# Patient Record
Sex: Male | Born: 1940 | Race: White | Hispanic: No | Marital: Married | Smoking: Current every day smoker
Health system: Southern US, Community
[De-identification: ages and names within clinical notes are randomized; demographics above are authoritative.]

## PROBLEM LIST (undated history)

## (undated) DIAGNOSIS — I639 Cerebral infarction, unspecified: Secondary | ICD-10-CM

## (undated) DIAGNOSIS — Z950 Presence of cardiac pacemaker: Secondary | ICD-10-CM

## (undated) DIAGNOSIS — E119 Type 2 diabetes mellitus without complications: Secondary | ICD-10-CM

## (undated) DIAGNOSIS — I10 Essential (primary) hypertension: Secondary | ICD-10-CM

## (undated) HISTORY — PX: CARDIAC SURGERY: SHX584

---

## 1997-11-30 ENCOUNTER — Emergency Department (HOSPITAL_COMMUNITY): Admission: EM | Admit: 1997-11-30 | Discharge: 1997-12-01 | Payer: Self-pay | Admitting: Internal Medicine

## 1997-12-03 ENCOUNTER — Inpatient Hospital Stay (HOSPITAL_COMMUNITY): Admission: EM | Admit: 1997-12-03 | Discharge: 1997-12-07 | Payer: Self-pay | Admitting: *Deleted

## 1998-06-29 ENCOUNTER — Encounter: Payer: Self-pay | Admitting: Cardiovascular Disease

## 1998-06-29 ENCOUNTER — Ambulatory Visit (HOSPITAL_COMMUNITY): Admission: RE | Admit: 1998-06-29 | Discharge: 1998-06-29 | Payer: Self-pay | Admitting: Cardiovascular Disease

## 1999-01-17 ENCOUNTER — Inpatient Hospital Stay (HOSPITAL_COMMUNITY): Admission: EM | Admit: 1999-01-17 | Discharge: 1999-01-20 | Payer: Self-pay | Admitting: Emergency Medicine

## 1999-01-19 ENCOUNTER — Encounter: Payer: Self-pay | Admitting: Cardiovascular Disease

## 2000-05-26 ENCOUNTER — Inpatient Hospital Stay (HOSPITAL_COMMUNITY): Admission: EM | Admit: 2000-05-26 | Discharge: 2000-05-30 | Payer: Self-pay

## 2002-07-03 ENCOUNTER — Inpatient Hospital Stay (HOSPITAL_COMMUNITY): Admission: EM | Admit: 2002-07-03 | Discharge: 2002-07-08 | Payer: Self-pay | Admitting: Emergency Medicine

## 2002-07-03 ENCOUNTER — Encounter: Payer: Self-pay | Admitting: Emergency Medicine

## 2002-07-04 ENCOUNTER — Encounter: Payer: Self-pay | Admitting: Neurology

## 2002-10-29 ENCOUNTER — Inpatient Hospital Stay (HOSPITAL_COMMUNITY): Admission: RE | Admit: 2002-10-29 | Discharge: 2002-11-01 | Payer: Self-pay | Admitting: Cardiovascular Disease

## 2011-05-11 DIAGNOSIS — M722 Plantar fascial fibromatosis: Secondary | ICD-10-CM | POA: Diagnosis not present

## 2011-08-03 DIAGNOSIS — Z79899 Other long term (current) drug therapy: Secondary | ICD-10-CM | POA: Diagnosis not present

## 2011-08-03 DIAGNOSIS — Z941 Heart transplant status: Secondary | ICD-10-CM | POA: Diagnosis not present

## 2011-08-03 DIAGNOSIS — Z48298 Encounter for aftercare following other organ transplant: Secondary | ICD-10-CM | POA: Diagnosis not present

## 2011-08-03 DIAGNOSIS — E119 Type 2 diabetes mellitus without complications: Secondary | ICD-10-CM | POA: Diagnosis not present

## 2012-01-10 DIAGNOSIS — Z23 Encounter for immunization: Secondary | ICD-10-CM | POA: Diagnosis not present

## 2012-01-30 DIAGNOSIS — Z48298 Encounter for aftercare following other organ transplant: Secondary | ICD-10-CM | POA: Diagnosis not present

## 2012-01-30 DIAGNOSIS — I517 Cardiomegaly: Secondary | ICD-10-CM | POA: Diagnosis not present

## 2012-01-30 DIAGNOSIS — Z125 Encounter for screening for malignant neoplasm of prostate: Secondary | ICD-10-CM | POA: Diagnosis not present

## 2012-01-30 DIAGNOSIS — Z01810 Encounter for preprocedural cardiovascular examination: Secondary | ICD-10-CM | POA: Diagnosis not present

## 2012-01-30 DIAGNOSIS — Z79899 Other long term (current) drug therapy: Secondary | ICD-10-CM | POA: Diagnosis not present

## 2012-01-30 DIAGNOSIS — Z7982 Long term (current) use of aspirin: Secondary | ICD-10-CM | POA: Diagnosis not present

## 2012-01-30 DIAGNOSIS — I5022 Chronic systolic (congestive) heart failure: Secondary | ICD-10-CM | POA: Diagnosis not present

## 2012-01-30 DIAGNOSIS — Z7901 Long term (current) use of anticoagulants: Secondary | ICD-10-CM | POA: Diagnosis not present

## 2012-01-30 DIAGNOSIS — E119 Type 2 diabetes mellitus without complications: Secondary | ICD-10-CM | POA: Diagnosis not present

## 2012-01-30 DIAGNOSIS — Z794 Long term (current) use of insulin: Secondary | ICD-10-CM | POA: Diagnosis not present

## 2012-01-30 DIAGNOSIS — Z941 Heart transplant status: Secondary | ICD-10-CM | POA: Diagnosis not present

## 2012-03-18 ENCOUNTER — Encounter (HOSPITAL_COMMUNITY): Payer: Self-pay | Admitting: *Deleted

## 2012-03-18 ENCOUNTER — Emergency Department (HOSPITAL_COMMUNITY)
Admission: EM | Admit: 2012-03-18 | Discharge: 2012-03-19 | Disposition: A | Discharge status: Home / Self Care | Payer: Medicare Other | Attending: Emergency Medicine | Admitting: Emergency Medicine

## 2012-03-18 ENCOUNTER — Emergency Department (HOSPITAL_COMMUNITY): Payer: Medicare Other

## 2012-03-18 DIAGNOSIS — E119 Type 2 diabetes mellitus without complications: Secondary | ICD-10-CM | POA: Insufficient documentation

## 2012-03-18 DIAGNOSIS — R51 Headache: Secondary | ICD-10-CM | POA: Diagnosis not present

## 2012-03-18 DIAGNOSIS — Z8673 Personal history of transient ischemic attack (TIA), and cerebral infarction without residual deficits: Secondary | ICD-10-CM | POA: Insufficient documentation

## 2012-03-18 DIAGNOSIS — J40 Bronchitis, not specified as acute or chronic: Secondary | ICD-10-CM | POA: Diagnosis not present

## 2012-03-18 DIAGNOSIS — J3489 Other specified disorders of nose and nasal sinuses: Secondary | ICD-10-CM | POA: Diagnosis not present

## 2012-03-18 DIAGNOSIS — E871 Hypo-osmolality and hyponatremia: Secondary | ICD-10-CM | POA: Diagnosis not present

## 2012-03-18 DIAGNOSIS — J209 Acute bronchitis, unspecified: Secondary | ICD-10-CM | POA: Diagnosis not present

## 2012-03-18 DIAGNOSIS — J9819 Other pulmonary collapse: Secondary | ICD-10-CM | POA: Diagnosis not present

## 2012-03-18 HISTORY — DX: Type 2 diabetes mellitus without complications: E11.9

## 2012-03-18 HISTORY — DX: Cerebral infarction, unspecified: I63.9

## 2012-03-18 LAB — CBC WITH DIFFERENTIAL/PLATELET
Basophils Absolute: 0 10*3/uL (ref 0.0–0.1)
Basophils Relative: 0 % (ref 0–1)
Eosinophils Absolute: 0.3 10*3/uL (ref 0.0–0.7)
Eosinophils Relative: 3 % (ref 0–5)
HCT: 35.7 % — ABNORMAL LOW (ref 39.0–52.0)
MCH: 31.3 pg (ref 26.0–34.0)
MCHC: 35 g/dL (ref 30.0–36.0)
MCV: 89.3 fL (ref 78.0–100.0)
Monocytes Absolute: 0.8 10*3/uL (ref 0.1–1.0)
Platelets: 149 10*3/uL — ABNORMAL LOW (ref 150–400)
RDW: 12.1 % (ref 11.5–15.5)
WBC: 8.5 10*3/uL (ref 4.0–10.5)

## 2012-03-18 LAB — COMPREHENSIVE METABOLIC PANEL
ALT: 13 U/L (ref 0–53)
Albumin: 3.7 g/dL (ref 3.5–5.2)
Alkaline Phosphatase: 54 U/L (ref 39–117)
BUN: 25 mg/dL — ABNORMAL HIGH (ref 6–23)
Chloride: 90 mEq/L — ABNORMAL LOW (ref 96–112)
Potassium: 4.3 mEq/L (ref 3.5–5.1)
Sodium: 125 mEq/L — ABNORMAL LOW (ref 135–145)
Total Bilirubin: 0.6 mg/dL (ref 0.3–1.2)
Total Protein: 6.7 g/dL (ref 6.0–8.3)

## 2012-03-18 NOTE — ED Notes (Addendum)
Pt of [facility].  C/o cough and congestion, cough persistant and intermitantly productive. Onset 10d ago. Sob onset today. Unable to lie flat. (Has tried: robitussin, lozenges, vick's vapor rub, humidifier, alka seltzer cold plus, delsym). heart transplant pt (2006). Recent trip to [place] (flight). Denies CP. (denies: nvd fever), Feels like he pulled a muscle in RUQ area. Wearing a mask.

## 2012-03-18 NOTE — ED Provider Notes (Signed)
History     CSN: [id number]  Arrival date & time 03/18/12  2213   First MD Initiated Contact with Patient 03/18/12 2335      Chief Complaint  Patient presents with  . Cough  . Nasal Congestion  . Headache    (Consider location/radiation/quality/duration/timing/severity/associated sxs/prior treatment) Patient is a 71 y.o. male presenting with cough and headaches. The history is provided by the patient.  Cough This is a new problem. The current episode started more than 1 week ago. The problem occurs every few minutes. The problem has not changed since onset.The cough is productive of purulent sputum. There has been no fever. Associated symptoms include headaches. Pertinent negatives include no chest pain, no chills, no sweats, no weight loss, no sore throat, no myalgias, no shortness of breath, no wheezing and no eye redness. He has tried nothing for the symptoms. The treatment provided no relief. He is not a smoker. His past medical history does not include pneumonia.  Headache  This is a chronic problem. The current episode started more than 1 week ago (at least 4 weeks ago). The problem occurs constantly. The problem has not changed since onset.The headache is associated with nothing. Pain location: moves around. The pain is at a severity of 4/10. The pain is moderate. The pain does not radiate. Pertinent negatives include no anorexia, no fever, no malaise/fatigue, no chest pressure, no near-syncope, no orthopnea, no palpitations, no syncope, no shortness of breath, no nausea and no vomiting. He has tried nothing for the symptoms. The treatment provided no relief.    Past Medical History  Diagnosis Date  . Diabetes mellitus without complication   . Stroke     Past Surgical History  Procedure Date  . Cardiac surgery     No family history on file.  History  Substance Use Topics  . Smoking status: Never Smoker   . Smokeless tobacco: Not on file  . Alcohol Use: No       Review of Systems  Constitutional: Negative for fever, chills, weight loss and malaise/fatigue.  HENT: Negative for sore throat, neck pain and neck stiffness.   Eyes: Negative for redness.  Respiratory: Positive for cough. Negative for choking, chest tightness, shortness of breath, wheezing and stridor.   Cardiovascular: Negative for chest pain, palpitations, orthopnea, leg swelling, syncope and near-syncope.  Gastrointestinal: Negative for nausea, vomiting, abdominal pain, diarrhea and anorexia.  Musculoskeletal: Negative for myalgias.  Neurological: Positive for headaches. Negative for dizziness, seizures, syncope, facial asymmetry, weakness, light-headedness and numbness.  Hematological: Negative for adenopathy.  All other systems reviewed and are negative.    Allergies  Codeine  Home Medications  No current outpatient prescriptions on file.  BP 154/87  Pulse 98  Temp 98.1 F (36.7 C) (Oral)  Resp 20  SpO2 97%  Physical Exam  Constitutional: He is oriented to person, place, and time. He appears well-developed and well-nourished. No distress.  HENT:  Head: Normocephalic and atraumatic.  Mouth/Throat: Oropharynx is clear and moist.  Eyes: Conjunctivae normal and EOM are normal. Pupils are equal, round, and reactive to light.  Neck: Normal range of motion. Neck supple.  Cardiovascular: Normal rate and regular rhythm.   Pulmonary/Chest: Effort normal and breath sounds normal. No stridor. No respiratory distress. He has no wheezes. He has no rales.  Abdominal: Soft. Bowel sounds are normal. There is no tenderness. There is no rebound and no guarding.  Musculoskeletal: Normal range of motion.  Lymphadenopathy:    He has no  cervical adenopathy.  Neurological: He is alert and oriented to person, place, and time. He has normal reflexes. No cranial nerve deficit.  Skin: Skin is warm and dry.  Psychiatric: He has a normal mood and affect.    ED Course  Procedures  (including critical care time)  Labs Reviewed  COMPREHENSIVE METABOLIC PANEL - Abnormal; Notable for the following:    Sodium 125 (*)     Chloride 90 (*)     BUN 25 (*)     GFR calc non Af Amer 65 (*)     GFR calc Af Amer 76 (*)     All other components within normal limits  CBC WITH DIFFERENTIAL - Abnormal; Notable for the following:    RBC 4.00 (*)     Hemoglobin 12.5 (*)     HCT 35.7 (*)     Platelets 149 (*)     Neutrophils Relative 78 (*)     Lymphocytes Relative 9 (*)     All other components within normal limits   Dg Chest 2 View  03/18/2012  *RADIOLOGY REPORT*  Clinical Data: Abdominal pain, vomiting  CHEST - 2 VIEW  Comparison: None  Findings: Upper normal heart size post median sternotomy. Calcified tortuous aorta. Elevation left diaphragm. Mediastinal contours and pulmonary vascularity normal. Left basilar atelectasis. Calcified granuloma mid right lung. Remaining lungs clear. No pleural effusion or pneumothorax. Bones unremarkable.  IMPRESSION: Elevation of left diaphragm with left basilar atelectasis. Prior median sternotomy. No acute abnormalities.   Original Report Authenticated By: Ulyses Southward, M.D.      No diagnosis found.    MDM  Given patient's history will treat with antibiotics.  Patient must have sodium rechecked this week.  Patient and wife verbalize understanding.  Return for chest pain shortness of breath, fevers > 101, weakness numbness changes in cognition or any concerns.  Follow up in 2 days with your family doctor. Patient and wife verbalize understanding and agree to follow up         Christiann Hagerty Smitty Cords, MD 03/19/12 7829

## 2012-03-19 ENCOUNTER — Encounter (HOSPITAL_COMMUNITY): Payer: Self-pay | Admitting: Emergency Medicine

## 2012-03-19 ENCOUNTER — Emergency Department (HOSPITAL_COMMUNITY): Payer: Medicare Other

## 2012-03-19 DIAGNOSIS — R51 Headache: Secondary | ICD-10-CM | POA: Diagnosis not present

## 2012-03-19 MED ORDER — LEVOFLOXACIN 750 MG PO TABS
750.0000 mg | ORAL_TABLET | Freq: Once | ORAL | Status: AC
  Administered 2012-03-19: 750 mg via ORAL
  Filled 2012-03-19: qty 1

## 2012-03-19 MED ORDER — TRAMADOL HCL 50 MG PO TABS
50.0000 mg | ORAL_TABLET | Freq: Once | ORAL | Status: AC
  Administered 2012-03-19: 50 mg via ORAL
  Filled 2012-03-19: qty 1

## 2012-03-19 MED ORDER — AMOXICILLIN-POT CLAVULANATE 875-125 MG PO TABS: 1.0000 | ORAL_TABLET | Freq: Two times a day (BID) | ORAL | Status: DC

## 2012-03-19 MED ORDER — BENZONATATE 100 MG PO CAPS: 100.0000 mg | ORAL_CAPSULE | Freq: Three times a day (TID) | ORAL | Status: DC

## 2012-03-19 NOTE — ED Notes (Signed)
Pt began having a productive cough 11 days ago, pt has been taking over the counter cold medications without relief.  Cough is no longer productive but is still strong and present.  Pt diaphoretic upon interview, placed on heart monitor.  Denies any N/V.  Pt is a heart transplant patient.

## 2012-03-21 DIAGNOSIS — E871 Hypo-osmolality and hyponatremia: Secondary | ICD-10-CM | POA: Diagnosis not present

## 2012-03-21 DIAGNOSIS — R05 Cough: Secondary | ICD-10-CM | POA: Diagnosis not present

## 2012-03-21 DIAGNOSIS — I10 Essential (primary) hypertension: Secondary | ICD-10-CM | POA: Diagnosis not present

## 2012-03-21 DIAGNOSIS — Z1331 Encounter for screening for depression: Secondary | ICD-10-CM | POA: Diagnosis not present

## 2012-03-21 DIAGNOSIS — J310 Chronic rhinitis: Secondary | ICD-10-CM | POA: Diagnosis not present

## 2012-05-23 DIAGNOSIS — E119 Type 2 diabetes mellitus without complications: Secondary | ICD-10-CM | POA: Diagnosis not present

## 2012-05-23 DIAGNOSIS — Z125 Encounter for screening for malignant neoplasm of prostate: Secondary | ICD-10-CM | POA: Diagnosis not present

## 2012-05-23 DIAGNOSIS — I10 Essential (primary) hypertension: Secondary | ICD-10-CM | POA: Diagnosis not present

## 2012-05-23 DIAGNOSIS — E782 Mixed hyperlipidemia: Secondary | ICD-10-CM | POA: Diagnosis not present

## 2012-05-28 DIAGNOSIS — Z1212 Encounter for screening for malignant neoplasm of rectum: Secondary | ICD-10-CM | POA: Diagnosis not present

## 2012-05-30 DIAGNOSIS — Z0000 Encounter for general adult medical examination without abnormal findings: Secondary | ICD-10-CM | POA: Diagnosis not present

## 2012-05-30 DIAGNOSIS — I10 Essential (primary) hypertension: Secondary | ICD-10-CM | POA: Diagnosis not present

## 2012-05-30 DIAGNOSIS — E119 Type 2 diabetes mellitus without complications: Secondary | ICD-10-CM | POA: Diagnosis not present

## 2012-05-30 DIAGNOSIS — Z125 Encounter for screening for malignant neoplasm of prostate: Secondary | ICD-10-CM | POA: Diagnosis not present

## 2012-08-15 DIAGNOSIS — Z79899 Other long term (current) drug therapy: Secondary | ICD-10-CM | POA: Diagnosis not present

## 2012-08-15 DIAGNOSIS — B469 Zygomycosis, unspecified: Secondary | ICD-10-CM | POA: Diagnosis not present

## 2012-08-15 DIAGNOSIS — Z48298 Encounter for aftercare following other organ transplant: Secondary | ICD-10-CM | POA: Diagnosis not present

## 2012-08-15 DIAGNOSIS — I517 Cardiomegaly: Secondary | ICD-10-CM | POA: Diagnosis not present

## 2012-08-15 DIAGNOSIS — Z941 Heart transplant status: Secondary | ICD-10-CM | POA: Diagnosis not present

## 2012-10-05 DIAGNOSIS — D485 Neoplasm of uncertain behavior of skin: Secondary | ICD-10-CM | POA: Diagnosis not present

## 2012-10-05 DIAGNOSIS — D0439 Carcinoma in situ of skin of other parts of face: Secondary | ICD-10-CM | POA: Diagnosis not present

## 2012-10-05 DIAGNOSIS — L570 Actinic keratosis: Secondary | ICD-10-CM | POA: Diagnosis not present

## 2012-10-05 DIAGNOSIS — Z85828 Personal history of other malignant neoplasm of skin: Secondary | ICD-10-CM | POA: Diagnosis not present

## 2012-10-05 DIAGNOSIS — D045 Carcinoma in situ of skin of trunk: Secondary | ICD-10-CM | POA: Diagnosis not present

## 2012-11-01 DIAGNOSIS — Z85828 Personal history of other malignant neoplasm of skin: Secondary | ICD-10-CM | POA: Diagnosis not present

## 2012-11-01 DIAGNOSIS — D0439 Carcinoma in situ of skin of other parts of face: Secondary | ICD-10-CM | POA: Diagnosis not present

## 2012-11-30 DIAGNOSIS — R197 Diarrhea, unspecified: Secondary | ICD-10-CM | POA: Diagnosis not present

## 2012-11-30 DIAGNOSIS — E119 Type 2 diabetes mellitus without complications: Secondary | ICD-10-CM | POA: Diagnosis not present

## 2012-11-30 DIAGNOSIS — M545 Low back pain: Secondary | ICD-10-CM | POA: Diagnosis not present

## 2012-11-30 DIAGNOSIS — M5137 Other intervertebral disc degeneration, lumbosacral region: Secondary | ICD-10-CM | POA: Diagnosis not present

## 2012-11-30 DIAGNOSIS — M47817 Spondylosis without myelopathy or radiculopathy, lumbosacral region: Secondary | ICD-10-CM | POA: Diagnosis not present

## 2012-11-30 DIAGNOSIS — G4700 Insomnia, unspecified: Secondary | ICD-10-CM | POA: Diagnosis not present

## 2012-12-04 DIAGNOSIS — R197 Diarrhea, unspecified: Secondary | ICD-10-CM | POA: Diagnosis not present

## 2013-01-03 DIAGNOSIS — Z23 Encounter for immunization: Secondary | ICD-10-CM | POA: Diagnosis not present

## 2013-02-13 DIAGNOSIS — Z79899 Other long term (current) drug therapy: Secondary | ICD-10-CM | POA: Diagnosis not present

## 2013-02-13 DIAGNOSIS — I517 Cardiomegaly: Secondary | ICD-10-CM | POA: Diagnosis not present

## 2013-02-13 DIAGNOSIS — Z125 Encounter for screening for malignant neoplasm of prostate: Secondary | ICD-10-CM | POA: Diagnosis not present

## 2013-02-13 DIAGNOSIS — Z48298 Encounter for aftercare following other organ transplant: Secondary | ICD-10-CM | POA: Diagnosis not present

## 2013-02-13 DIAGNOSIS — Z01810 Encounter for preprocedural cardiovascular examination: Secondary | ICD-10-CM | POA: Diagnosis not present

## 2013-02-13 DIAGNOSIS — Z941 Heart transplant status: Secondary | ICD-10-CM | POA: Diagnosis not present

## 2013-02-13 DIAGNOSIS — Z5181 Encounter for therapeutic drug level monitoring: Secondary | ICD-10-CM | POA: Diagnosis not present

## 2013-03-01 DIAGNOSIS — D0439 Carcinoma in situ of skin of other parts of face: Secondary | ICD-10-CM | POA: Diagnosis not present

## 2013-03-01 DIAGNOSIS — D692 Other nonthrombocytopenic purpura: Secondary | ICD-10-CM | POA: Diagnosis not present

## 2013-03-01 DIAGNOSIS — L570 Actinic keratosis: Secondary | ICD-10-CM | POA: Diagnosis not present

## 2013-03-01 DIAGNOSIS — C44319 Basal cell carcinoma of skin of other parts of face: Secondary | ICD-10-CM | POA: Diagnosis not present

## 2013-03-01 DIAGNOSIS — Z85828 Personal history of other malignant neoplasm of skin: Secondary | ICD-10-CM | POA: Diagnosis not present

## 2013-03-01 DIAGNOSIS — L910 Hypertrophic scar: Secondary | ICD-10-CM | POA: Diagnosis not present

## 2013-03-01 DIAGNOSIS — D0460 Carcinoma in situ of skin of unspecified upper limb, including shoulder: Secondary | ICD-10-CM | POA: Diagnosis not present

## 2013-03-01 DIAGNOSIS — B079 Viral wart, unspecified: Secondary | ICD-10-CM | POA: Diagnosis not present

## 2013-03-01 DIAGNOSIS — L821 Other seborrheic keratosis: Secondary | ICD-10-CM | POA: Diagnosis not present

## 2013-03-06 DIAGNOSIS — I10 Essential (primary) hypertension: Secondary | ICD-10-CM | POA: Diagnosis not present

## 2013-03-06 DIAGNOSIS — E782 Mixed hyperlipidemia: Secondary | ICD-10-CM | POA: Diagnosis not present

## 2013-03-06 DIAGNOSIS — E1159 Type 2 diabetes mellitus with other circulatory complications: Secondary | ICD-10-CM | POA: Diagnosis not present

## 2013-03-06 DIAGNOSIS — Z941 Heart transplant status: Secondary | ICD-10-CM | POA: Diagnosis not present

## 2013-03-06 DIAGNOSIS — I739 Peripheral vascular disease, unspecified: Secondary | ICD-10-CM | POA: Diagnosis not present

## 2013-03-06 DIAGNOSIS — Z6829 Body mass index (BMI) 29.0-29.9, adult: Secondary | ICD-10-CM | POA: Diagnosis not present

## 2013-04-09 DIAGNOSIS — Z85828 Personal history of other malignant neoplasm of skin: Secondary | ICD-10-CM | POA: Diagnosis not present

## 2013-04-09 DIAGNOSIS — C44320 Squamous cell carcinoma of skin of unspecified parts of face: Secondary | ICD-10-CM | POA: Diagnosis not present

## 2013-04-16 DIAGNOSIS — Z85828 Personal history of other malignant neoplasm of skin: Secondary | ICD-10-CM | POA: Diagnosis not present

## 2013-04-16 DIAGNOSIS — C44319 Basal cell carcinoma of skin of other parts of face: Secondary | ICD-10-CM | POA: Diagnosis not present

## 2013-05-27 DIAGNOSIS — Z125 Encounter for screening for malignant neoplasm of prostate: Secondary | ICD-10-CM | POA: Diagnosis not present

## 2013-05-27 DIAGNOSIS — E1159 Type 2 diabetes mellitus with other circulatory complications: Secondary | ICD-10-CM | POA: Diagnosis not present

## 2013-05-27 DIAGNOSIS — I10 Essential (primary) hypertension: Secondary | ICD-10-CM | POA: Diagnosis not present

## 2013-05-31 DIAGNOSIS — Z1212 Encounter for screening for malignant neoplasm of rectum: Secondary | ICD-10-CM | POA: Diagnosis not present

## 2013-06-03 DIAGNOSIS — I739 Peripheral vascular disease, unspecified: Secondary | ICD-10-CM | POA: Diagnosis not present

## 2013-06-03 DIAGNOSIS — Z125 Encounter for screening for malignant neoplasm of prostate: Secondary | ICD-10-CM | POA: Diagnosis not present

## 2013-06-03 DIAGNOSIS — N183 Chronic kidney disease, stage 3 unspecified: Secondary | ICD-10-CM | POA: Diagnosis not present

## 2013-06-03 DIAGNOSIS — E1159 Type 2 diabetes mellitus with other circulatory complications: Secondary | ICD-10-CM | POA: Diagnosis not present

## 2013-06-03 DIAGNOSIS — N529 Male erectile dysfunction, unspecified: Secondary | ICD-10-CM | POA: Diagnosis not present

## 2013-06-03 DIAGNOSIS — Z0000 Encounter for general adult medical examination without abnormal findings: Secondary | ICD-10-CM | POA: Diagnosis not present

## 2013-06-03 DIAGNOSIS — Z1331 Encounter for screening for depression: Secondary | ICD-10-CM | POA: Diagnosis not present

## 2013-06-03 DIAGNOSIS — Z941 Heart transplant status: Secondary | ICD-10-CM | POA: Diagnosis not present

## 2013-06-03 DIAGNOSIS — G2581 Restless legs syndrome: Secondary | ICD-10-CM | POA: Diagnosis not present

## 2013-06-03 DIAGNOSIS — E782 Mixed hyperlipidemia: Secondary | ICD-10-CM | POA: Diagnosis not present

## 2013-08-27 DIAGNOSIS — B469 Zygomycosis, unspecified: Secondary | ICD-10-CM | POA: Diagnosis not present

## 2013-08-27 DIAGNOSIS — I10 Essential (primary) hypertension: Secondary | ICD-10-CM | POA: Diagnosis not present

## 2013-08-27 DIAGNOSIS — Z79899 Other long term (current) drug therapy: Secondary | ICD-10-CM | POA: Diagnosis not present

## 2013-08-27 DIAGNOSIS — I517 Cardiomegaly: Secondary | ICD-10-CM | POA: Diagnosis not present

## 2013-08-27 DIAGNOSIS — Z48298 Encounter for aftercare following other organ transplant: Secondary | ICD-10-CM | POA: Diagnosis not present

## 2013-08-27 DIAGNOSIS — Z941 Heart transplant status: Secondary | ICD-10-CM | POA: Diagnosis not present

## 2013-09-12 DIAGNOSIS — E1159 Type 2 diabetes mellitus with other circulatory complications: Secondary | ICD-10-CM | POA: Diagnosis not present

## 2013-09-12 DIAGNOSIS — Z6829 Body mass index (BMI) 29.0-29.9, adult: Secondary | ICD-10-CM | POA: Diagnosis not present

## 2013-09-12 DIAGNOSIS — I739 Peripheral vascular disease, unspecified: Secondary | ICD-10-CM | POA: Diagnosis not present

## 2013-09-12 DIAGNOSIS — E782 Mixed hyperlipidemia: Secondary | ICD-10-CM | POA: Diagnosis not present

## 2013-09-12 DIAGNOSIS — N183 Chronic kidney disease, stage 3 unspecified: Secondary | ICD-10-CM | POA: Diagnosis not present

## 2013-09-12 DIAGNOSIS — I10 Essential (primary) hypertension: Secondary | ICD-10-CM | POA: Diagnosis not present

## 2013-09-12 DIAGNOSIS — Z941 Heart transplant status: Secondary | ICD-10-CM | POA: Diagnosis not present

## 2013-10-03 DIAGNOSIS — D239 Other benign neoplasm of skin, unspecified: Secondary | ICD-10-CM | POA: Diagnosis not present

## 2013-10-03 DIAGNOSIS — L570 Actinic keratosis: Secondary | ICD-10-CM | POA: Diagnosis not present

## 2013-10-03 DIAGNOSIS — L821 Other seborrheic keratosis: Secondary | ICD-10-CM | POA: Diagnosis not present

## 2013-10-03 DIAGNOSIS — Z85828 Personal history of other malignant neoplasm of skin: Secondary | ICD-10-CM | POA: Diagnosis not present

## 2013-10-03 DIAGNOSIS — D1801 Hemangioma of skin and subcutaneous tissue: Secondary | ICD-10-CM | POA: Diagnosis not present

## 2013-10-03 DIAGNOSIS — L259 Unspecified contact dermatitis, unspecified cause: Secondary | ICD-10-CM | POA: Diagnosis not present

## 2013-12-03 DIAGNOSIS — H524 Presbyopia: Secondary | ICD-10-CM | POA: Diagnosis not present

## 2013-12-03 DIAGNOSIS — H4011X0 Primary open-angle glaucoma, stage unspecified: Secondary | ICD-10-CM | POA: Diagnosis not present

## 2013-12-03 DIAGNOSIS — H2510 Age-related nuclear cataract, unspecified eye: Secondary | ICD-10-CM | POA: Diagnosis not present

## 2013-12-03 DIAGNOSIS — H409 Unspecified glaucoma: Secondary | ICD-10-CM | POA: Diagnosis not present

## 2013-12-12 DIAGNOSIS — I739 Peripheral vascular disease, unspecified: Secondary | ICD-10-CM | POA: Diagnosis not present

## 2013-12-12 DIAGNOSIS — R55 Syncope and collapse: Secondary | ICD-10-CM | POA: Diagnosis not present

## 2013-12-12 DIAGNOSIS — E1159 Type 2 diabetes mellitus with other circulatory complications: Secondary | ICD-10-CM | POA: Diagnosis not present

## 2013-12-12 DIAGNOSIS — Z6830 Body mass index (BMI) 30.0-30.9, adult: Secondary | ICD-10-CM | POA: Diagnosis not present

## 2013-12-16 DIAGNOSIS — Z941 Heart transplant status: Secondary | ICD-10-CM | POA: Diagnosis not present

## 2013-12-16 DIAGNOSIS — Z79899 Other long term (current) drug therapy: Secondary | ICD-10-CM | POA: Diagnosis not present

## 2013-12-19 DIAGNOSIS — Z941 Heart transplant status: Secondary | ICD-10-CM | POA: Diagnosis not present

## 2014-01-29 DIAGNOSIS — R55 Syncope and collapse: Secondary | ICD-10-CM | POA: Diagnosis not present

## 2014-01-29 DIAGNOSIS — Z23 Encounter for immunization: Secondary | ICD-10-CM | POA: Diagnosis not present

## 2014-01-29 DIAGNOSIS — Z941 Heart transplant status: Secondary | ICD-10-CM | POA: Diagnosis not present

## 2014-01-30 DIAGNOSIS — R55 Syncope and collapse: Secondary | ICD-10-CM | POA: Diagnosis not present

## 2014-02-04 DIAGNOSIS — H4011X2 Primary open-angle glaucoma, moderate stage: Secondary | ICD-10-CM | POA: Diagnosis not present

## 2014-02-22 DIAGNOSIS — I442 Atrioventricular block, complete: Secondary | ICD-10-CM | POA: Diagnosis not present

## 2014-02-22 DIAGNOSIS — E785 Hyperlipidemia, unspecified: Secondary | ICD-10-CM | POA: Diagnosis present

## 2014-02-22 DIAGNOSIS — I2510 Atherosclerotic heart disease of native coronary artery without angina pectoris: Secondary | ICD-10-CM | POA: Diagnosis not present

## 2014-02-22 DIAGNOSIS — I10 Essential (primary) hypertension: Secondary | ICD-10-CM | POA: Diagnosis present

## 2014-02-22 DIAGNOSIS — Z941 Heart transplant status: Secondary | ICD-10-CM | POA: Diagnosis not present

## 2014-02-22 DIAGNOSIS — Z45018 Encounter for adjustment and management of other part of cardiac pacemaker: Secondary | ICD-10-CM | POA: Diagnosis not present

## 2014-02-22 DIAGNOSIS — Z48298 Encounter for aftercare following other organ transplant: Secondary | ICD-10-CM | POA: Diagnosis not present

## 2014-02-22 DIAGNOSIS — Z01810 Encounter for preprocedural cardiovascular examination: Secondary | ICD-10-CM | POA: Diagnosis not present

## 2014-02-22 DIAGNOSIS — R55 Syncope and collapse: Secondary | ICD-10-CM | POA: Diagnosis not present

## 2014-02-22 DIAGNOSIS — Z8673 Personal history of transient ischemic attack (TIA), and cerebral infarction without residual deficits: Secondary | ICD-10-CM | POA: Diagnosis not present

## 2014-02-22 DIAGNOSIS — I498 Other specified cardiac arrhythmias: Secondary | ICD-10-CM | POA: Diagnosis not present

## 2014-02-22 DIAGNOSIS — I495 Sick sinus syndrome: Secondary | ICD-10-CM | POA: Diagnosis present

## 2014-02-22 DIAGNOSIS — J9811 Atelectasis: Secondary | ICD-10-CM | POA: Diagnosis not present

## 2014-02-22 DIAGNOSIS — Z79899 Other long term (current) drug therapy: Secondary | ICD-10-CM | POA: Diagnosis not present

## 2014-02-22 DIAGNOSIS — T86298 Other complications of heart transplant: Secondary | ICD-10-CM | POA: Diagnosis not present

## 2014-02-22 DIAGNOSIS — E119 Type 2 diabetes mellitus without complications: Secondary | ICD-10-CM | POA: Diagnosis present

## 2014-03-27 DIAGNOSIS — E1151 Type 2 diabetes mellitus with diabetic peripheral angiopathy without gangrene: Secondary | ICD-10-CM | POA: Diagnosis not present

## 2014-03-27 DIAGNOSIS — R55 Syncope and collapse: Secondary | ICD-10-CM | POA: Diagnosis not present

## 2014-03-27 DIAGNOSIS — E782 Mixed hyperlipidemia: Secondary | ICD-10-CM | POA: Diagnosis not present

## 2014-03-27 DIAGNOSIS — I739 Peripheral vascular disease, unspecified: Secondary | ICD-10-CM | POA: Diagnosis not present

## 2014-03-27 DIAGNOSIS — I10 Essential (primary) hypertension: Secondary | ICD-10-CM | POA: Diagnosis not present

## 2014-03-27 DIAGNOSIS — G2581 Restless legs syndrome: Secondary | ICD-10-CM | POA: Diagnosis not present

## 2014-03-27 DIAGNOSIS — Z941 Heart transplant status: Secondary | ICD-10-CM | POA: Diagnosis not present

## 2014-03-31 DIAGNOSIS — Z23 Encounter for immunization: Secondary | ICD-10-CM | POA: Diagnosis not present

## 2014-03-31 DIAGNOSIS — Z941 Heart transplant status: Secondary | ICD-10-CM | POA: Diagnosis not present

## 2014-03-31 DIAGNOSIS — R55 Syncope and collapse: Secondary | ICD-10-CM | POA: Diagnosis not present

## 2014-03-31 DIAGNOSIS — Z4821 Encounter for aftercare following heart transplant: Secondary | ICD-10-CM | POA: Diagnosis not present

## 2014-03-31 DIAGNOSIS — Z79899 Other long term (current) drug therapy: Secondary | ICD-10-CM | POA: Diagnosis not present

## 2014-03-31 DIAGNOSIS — E119 Type 2 diabetes mellitus without complications: Secondary | ICD-10-CM | POA: Diagnosis not present

## 2014-04-04 DIAGNOSIS — L570 Actinic keratosis: Secondary | ICD-10-CM | POA: Diagnosis not present

## 2014-04-04 DIAGNOSIS — D045 Carcinoma in situ of skin of trunk: Secondary | ICD-10-CM | POA: Diagnosis not present

## 2014-04-04 DIAGNOSIS — D485 Neoplasm of uncertain behavior of skin: Secondary | ICD-10-CM | POA: Diagnosis not present

## 2014-04-04 DIAGNOSIS — Z85828 Personal history of other malignant neoplasm of skin: Secondary | ICD-10-CM | POA: Diagnosis not present

## 2014-04-04 DIAGNOSIS — D225 Melanocytic nevi of trunk: Secondary | ICD-10-CM | POA: Diagnosis not present

## 2014-04-04 DIAGNOSIS — D692 Other nonthrombocytopenic purpura: Secondary | ICD-10-CM | POA: Diagnosis not present

## 2014-04-04 DIAGNOSIS — L821 Other seborrheic keratosis: Secondary | ICD-10-CM | POA: Diagnosis not present

## 2014-04-16 DIAGNOSIS — H4011X2 Primary open-angle glaucoma, moderate stage: Secondary | ICD-10-CM | POA: Diagnosis not present

## 2014-04-16 DIAGNOSIS — Z6829 Body mass index (BMI) 29.0-29.9, adult: Secondary | ICD-10-CM | POA: Diagnosis not present

## 2014-04-16 DIAGNOSIS — E119 Type 2 diabetes mellitus without complications: Secondary | ICD-10-CM | POA: Diagnosis not present

## 2014-04-16 DIAGNOSIS — I10 Essential (primary) hypertension: Secondary | ICD-10-CM | POA: Diagnosis not present

## 2014-04-16 DIAGNOSIS — Z941 Heart transplant status: Secondary | ICD-10-CM | POA: Diagnosis not present

## 2014-04-16 DIAGNOSIS — E1151 Type 2 diabetes mellitus with diabetic peripheral angiopathy without gangrene: Secondary | ICD-10-CM | POA: Diagnosis not present

## 2014-04-16 DIAGNOSIS — I739 Peripheral vascular disease, unspecified: Secondary | ICD-10-CM | POA: Diagnosis not present

## 2014-05-22 DIAGNOSIS — H4011X2 Primary open-angle glaucoma, moderate stage: Secondary | ICD-10-CM | POA: Diagnosis not present

## 2014-06-03 DIAGNOSIS — Z45018 Encounter for adjustment and management of other part of cardiac pacemaker: Secondary | ICD-10-CM | POA: Diagnosis not present

## 2014-06-03 DIAGNOSIS — Z950 Presence of cardiac pacemaker: Secondary | ICD-10-CM | POA: Diagnosis not present

## 2014-06-12 DIAGNOSIS — I10 Essential (primary) hypertension: Secondary | ICD-10-CM | POA: Diagnosis not present

## 2014-06-12 DIAGNOSIS — Z0000 Encounter for general adult medical examination without abnormal findings: Secondary | ICD-10-CM | POA: Diagnosis not present

## 2014-06-12 DIAGNOSIS — E782 Mixed hyperlipidemia: Secondary | ICD-10-CM | POA: Diagnosis not present

## 2014-06-12 DIAGNOSIS — Z125 Encounter for screening for malignant neoplasm of prostate: Secondary | ICD-10-CM | POA: Diagnosis not present

## 2014-06-12 DIAGNOSIS — E1151 Type 2 diabetes mellitus with diabetic peripheral angiopathy without gangrene: Secondary | ICD-10-CM | POA: Diagnosis not present

## 2014-06-16 DIAGNOSIS — Z1212 Encounter for screening for malignant neoplasm of rectum: Secondary | ICD-10-CM | POA: Diagnosis not present

## 2014-06-18 DIAGNOSIS — H4011X2 Primary open-angle glaucoma, moderate stage: Secondary | ICD-10-CM | POA: Diagnosis not present

## 2014-06-19 DIAGNOSIS — Z941 Heart transplant status: Secondary | ICD-10-CM | POA: Diagnosis not present

## 2014-06-19 DIAGNOSIS — I739 Peripheral vascular disease, unspecified: Secondary | ICD-10-CM | POA: Diagnosis not present

## 2014-06-19 DIAGNOSIS — E1151 Type 2 diabetes mellitus with diabetic peripheral angiopathy without gangrene: Secondary | ICD-10-CM | POA: Diagnosis not present

## 2014-06-19 DIAGNOSIS — N183 Chronic kidney disease, stage 3 (moderate): Secondary | ICD-10-CM | POA: Diagnosis not present

## 2014-06-19 DIAGNOSIS — N529 Male erectile dysfunction, unspecified: Secondary | ICD-10-CM | POA: Diagnosis not present

## 2014-06-19 DIAGNOSIS — E782 Mixed hyperlipidemia: Secondary | ICD-10-CM | POA: Diagnosis not present

## 2014-06-19 DIAGNOSIS — I10 Essential (primary) hypertension: Secondary | ICD-10-CM | POA: Diagnosis not present

## 2014-06-19 DIAGNOSIS — Z1389 Encounter for screening for other disorder: Secondary | ICD-10-CM | POA: Diagnosis not present

## 2014-06-19 DIAGNOSIS — Z6830 Body mass index (BMI) 30.0-30.9, adult: Secondary | ICD-10-CM | POA: Diagnosis not present

## 2014-06-19 DIAGNOSIS — Z0000 Encounter for general adult medical examination without abnormal findings: Secondary | ICD-10-CM | POA: Diagnosis not present

## 2014-07-21 DIAGNOSIS — H4011X2 Primary open-angle glaucoma, moderate stage: Secondary | ICD-10-CM | POA: Diagnosis not present

## 2014-08-08 DIAGNOSIS — L570 Actinic keratosis: Secondary | ICD-10-CM | POA: Diagnosis not present

## 2014-08-08 DIAGNOSIS — D692 Other nonthrombocytopenic purpura: Secondary | ICD-10-CM | POA: Diagnosis not present

## 2014-08-08 DIAGNOSIS — L438 Other lichen planus: Secondary | ICD-10-CM | POA: Diagnosis not present

## 2014-08-08 DIAGNOSIS — Z85828 Personal history of other malignant neoplasm of skin: Secondary | ICD-10-CM | POA: Diagnosis not present

## 2014-08-08 DIAGNOSIS — L821 Other seborrheic keratosis: Secondary | ICD-10-CM | POA: Diagnosis not present

## 2014-08-21 DIAGNOSIS — Z4821 Encounter for aftercare following heart transplant: Secondary | ICD-10-CM | POA: Diagnosis not present

## 2014-08-21 DIAGNOSIS — Z48298 Encounter for aftercare following other organ transplant: Secondary | ICD-10-CM | POA: Diagnosis not present

## 2014-08-21 DIAGNOSIS — Z941 Heart transplant status: Secondary | ICD-10-CM | POA: Diagnosis not present

## 2014-08-21 DIAGNOSIS — Z79899 Other long term (current) drug therapy: Secondary | ICD-10-CM | POA: Diagnosis not present

## 2014-09-11 DIAGNOSIS — Z48298 Encounter for aftercare following other organ transplant: Secondary | ICD-10-CM | POA: Diagnosis not present

## 2014-09-11 DIAGNOSIS — Z79899 Other long term (current) drug therapy: Secondary | ICD-10-CM | POA: Diagnosis not present

## 2014-09-11 DIAGNOSIS — Z5181 Encounter for therapeutic drug level monitoring: Secondary | ICD-10-CM | POA: Diagnosis not present

## 2014-09-11 DIAGNOSIS — Z941 Heart transplant status: Secondary | ICD-10-CM | POA: Diagnosis not present

## 2014-09-16 DIAGNOSIS — I442 Atrioventricular block, complete: Secondary | ICD-10-CM | POA: Diagnosis not present

## 2014-09-16 DIAGNOSIS — Z950 Presence of cardiac pacemaker: Secondary | ICD-10-CM | POA: Diagnosis not present

## 2014-09-16 DIAGNOSIS — R55 Syncope and collapse: Secondary | ICD-10-CM | POA: Diagnosis not present

## 2014-09-17 ENCOUNTER — Encounter (HOSPITAL_COMMUNITY): Payer: Self-pay | Admitting: Emergency Medicine

## 2014-09-17 ENCOUNTER — Emergency Department (HOSPITAL_COMMUNITY)
Admission: EM | Admit: 2014-09-17 | Discharge: 2014-09-17 | Discharge status: Against Medical Advice | Payer: Medicare Other | Attending: Emergency Medicine | Admitting: Emergency Medicine

## 2014-09-17 DIAGNOSIS — M7989 Other specified soft tissue disorders: Secondary | ICD-10-CM | POA: Insufficient documentation

## 2014-09-17 DIAGNOSIS — R0602 Shortness of breath: Secondary | ICD-10-CM | POA: Diagnosis not present

## 2014-09-17 DIAGNOSIS — E119 Type 2 diabetes mellitus without complications: Secondary | ICD-10-CM | POA: Insufficient documentation

## 2014-09-17 DIAGNOSIS — I10 Essential (primary) hypertension: Secondary | ICD-10-CM | POA: Diagnosis not present

## 2014-09-17 DIAGNOSIS — Z720 Tobacco use: Secondary | ICD-10-CM | POA: Diagnosis not present

## 2014-09-17 HISTORY — DX: Essential (primary) hypertension: I10

## 2014-09-17 NOTE — ED Notes (Signed)
Pt c/o SOB and leg swelling; pt seen at PCP today and told may have PNA

## 2014-10-13 DIAGNOSIS — E1151 Type 2 diabetes mellitus with diabetic peripheral angiopathy without gangrene: Secondary | ICD-10-CM | POA: Diagnosis not present

## 2014-10-13 DIAGNOSIS — Z941 Heart transplant status: Secondary | ICD-10-CM | POA: Diagnosis not present

## 2014-10-13 DIAGNOSIS — I739 Peripheral vascular disease, unspecified: Secondary | ICD-10-CM | POA: Diagnosis not present

## 2014-10-13 DIAGNOSIS — I10 Essential (primary) hypertension: Secondary | ICD-10-CM | POA: Diagnosis not present

## 2014-10-13 DIAGNOSIS — E782 Mixed hyperlipidemia: Secondary | ICD-10-CM | POA: Diagnosis not present

## 2014-10-13 DIAGNOSIS — Z6829 Body mass index (BMI) 29.0-29.9, adult: Secondary | ICD-10-CM | POA: Diagnosis not present

## 2014-11-18 DIAGNOSIS — H524 Presbyopia: Secondary | ICD-10-CM | POA: Diagnosis not present

## 2014-11-18 DIAGNOSIS — H4011X2 Primary open-angle glaucoma, moderate stage: Secondary | ICD-10-CM | POA: Diagnosis not present

## 2014-11-18 DIAGNOSIS — H2513 Age-related nuclear cataract, bilateral: Secondary | ICD-10-CM | POA: Diagnosis not present

## 2014-11-18 DIAGNOSIS — H43812 Vitreous degeneration, left eye: Secondary | ICD-10-CM | POA: Diagnosis not present

## 2014-12-03 DIAGNOSIS — Z6829 Body mass index (BMI) 29.0-29.9, adult: Secondary | ICD-10-CM | POA: Diagnosis not present

## 2014-12-03 DIAGNOSIS — I10 Essential (primary) hypertension: Secondary | ICD-10-CM | POA: Diagnosis not present

## 2014-12-03 DIAGNOSIS — E1151 Type 2 diabetes mellitus with diabetic peripheral angiopathy without gangrene: Secondary | ICD-10-CM | POA: Diagnosis not present

## 2014-12-03 DIAGNOSIS — N183 Chronic kidney disease, stage 3 (moderate): Secondary | ICD-10-CM | POA: Diagnosis not present

## 2014-12-03 DIAGNOSIS — Z941 Heart transplant status: Secondary | ICD-10-CM | POA: Diagnosis not present

## 2014-12-23 DIAGNOSIS — Z950 Presence of cardiac pacemaker: Secondary | ICD-10-CM | POA: Diagnosis not present

## 2014-12-23 DIAGNOSIS — R55 Syncope and collapse: Secondary | ICD-10-CM | POA: Diagnosis not present

## 2015-02-03 DIAGNOSIS — Z794 Long term (current) use of insulin: Secondary | ICD-10-CM | POA: Diagnosis not present

## 2015-02-03 DIAGNOSIS — Z48298 Encounter for aftercare following other organ transplant: Secondary | ICD-10-CM | POA: Diagnosis not present

## 2015-02-03 DIAGNOSIS — Z01810 Encounter for preprocedural cardiovascular examination: Secondary | ICD-10-CM | POA: Diagnosis not present

## 2015-02-03 DIAGNOSIS — E119 Type 2 diabetes mellitus without complications: Secondary | ICD-10-CM | POA: Diagnosis not present

## 2015-02-03 DIAGNOSIS — I25759 Atherosclerosis of native coronary artery of transplanted heart with unspecified angina pectoris: Secondary | ICD-10-CM | POA: Diagnosis not present

## 2015-02-03 DIAGNOSIS — Z950 Presence of cardiac pacemaker: Secondary | ICD-10-CM | POA: Diagnosis not present

## 2015-02-03 DIAGNOSIS — Z23 Encounter for immunization: Secondary | ICD-10-CM | POA: Diagnosis not present

## 2015-02-03 DIAGNOSIS — Z941 Heart transplant status: Secondary | ICD-10-CM | POA: Diagnosis not present

## 2015-02-03 DIAGNOSIS — Z7952 Long term (current) use of systemic steroids: Secondary | ICD-10-CM | POA: Diagnosis not present

## 2015-02-03 DIAGNOSIS — I25811 Atherosclerosis of native coronary artery of transplanted heart without angina pectoris: Secondary | ICD-10-CM | POA: Diagnosis not present

## 2015-02-03 DIAGNOSIS — Z79899 Other long term (current) drug therapy: Secondary | ICD-10-CM | POA: Diagnosis not present

## 2015-02-03 DIAGNOSIS — Z7982 Long term (current) use of aspirin: Secondary | ICD-10-CM | POA: Diagnosis not present

## 2015-02-03 DIAGNOSIS — R9431 Abnormal electrocardiogram [ECG] [EKG]: Secondary | ICD-10-CM | POA: Diagnosis not present

## 2015-02-03 DIAGNOSIS — Z125 Encounter for screening for malignant neoplasm of prostate: Secondary | ICD-10-CM | POA: Diagnosis not present

## 2015-02-03 DIAGNOSIS — Z8673 Personal history of transient ischemic attack (TIA), and cerebral infarction without residual deficits: Secondary | ICD-10-CM | POA: Diagnosis not present

## 2015-02-04 DIAGNOSIS — R9431 Abnormal electrocardiogram [ECG] [EKG]: Secondary | ICD-10-CM | POA: Diagnosis not present

## 2015-02-04 DIAGNOSIS — Z8673 Personal history of transient ischemic attack (TIA), and cerebral infarction without residual deficits: Secondary | ICD-10-CM | POA: Diagnosis not present

## 2015-02-04 DIAGNOSIS — Z941 Heart transplant status: Secondary | ICD-10-CM | POA: Diagnosis not present

## 2015-02-04 DIAGNOSIS — E119 Type 2 diabetes mellitus without complications: Secondary | ICD-10-CM | POA: Diagnosis not present

## 2015-02-04 DIAGNOSIS — Z01810 Encounter for preprocedural cardiovascular examination: Secondary | ICD-10-CM | POA: Diagnosis not present

## 2015-02-04 DIAGNOSIS — Z950 Presence of cardiac pacemaker: Secondary | ICD-10-CM | POA: Diagnosis not present

## 2015-02-04 DIAGNOSIS — I25759 Atherosclerosis of native coronary artery of transplanted heart with unspecified angina pectoris: Secondary | ICD-10-CM | POA: Diagnosis not present

## 2015-02-11 DIAGNOSIS — D0439 Carcinoma in situ of skin of other parts of face: Secondary | ICD-10-CM | POA: Diagnosis not present

## 2015-02-11 DIAGNOSIS — D1801 Hemangioma of skin and subcutaneous tissue: Secondary | ICD-10-CM | POA: Diagnosis not present

## 2015-02-11 DIAGNOSIS — L570 Actinic keratosis: Secondary | ICD-10-CM | POA: Diagnosis not present

## 2015-02-11 DIAGNOSIS — L720 Epidermal cyst: Secondary | ICD-10-CM | POA: Diagnosis not present

## 2015-02-11 DIAGNOSIS — D225 Melanocytic nevi of trunk: Secondary | ICD-10-CM | POA: Diagnosis not present

## 2015-02-11 DIAGNOSIS — D0462 Carcinoma in situ of skin of left upper limb, including shoulder: Secondary | ICD-10-CM | POA: Diagnosis not present

## 2015-02-11 DIAGNOSIS — D0472 Carcinoma in situ of skin of left lower limb, including hip: Secondary | ICD-10-CM | POA: Diagnosis not present

## 2015-02-11 DIAGNOSIS — Z85828 Personal history of other malignant neoplasm of skin: Secondary | ICD-10-CM | POA: Diagnosis not present

## 2015-02-11 DIAGNOSIS — D485 Neoplasm of uncertain behavior of skin: Secondary | ICD-10-CM | POA: Diagnosis not present

## 2015-02-11 DIAGNOSIS — D0461 Carcinoma in situ of skin of right upper limb, including shoulder: Secondary | ICD-10-CM | POA: Diagnosis not present

## 2015-02-11 DIAGNOSIS — L821 Other seborrheic keratosis: Secondary | ICD-10-CM | POA: Diagnosis not present

## 2015-02-11 DIAGNOSIS — D692 Other nonthrombocytopenic purpura: Secondary | ICD-10-CM | POA: Diagnosis not present

## 2015-02-19 DIAGNOSIS — Z48298 Encounter for aftercare following other organ transplant: Secondary | ICD-10-CM | POA: Diagnosis not present

## 2015-02-19 DIAGNOSIS — I25759 Atherosclerosis of native coronary artery of transplanted heart with unspecified angina pectoris: Secondary | ICD-10-CM | POA: Diagnosis not present

## 2015-02-19 DIAGNOSIS — Z79899 Other long term (current) drug therapy: Secondary | ICD-10-CM | POA: Diagnosis not present

## 2015-02-19 DIAGNOSIS — Z941 Heart transplant status: Secondary | ICD-10-CM | POA: Diagnosis not present

## 2015-02-24 DIAGNOSIS — Z23 Encounter for immunization: Secondary | ICD-10-CM | POA: Diagnosis not present

## 2015-03-04 DIAGNOSIS — C44319 Basal cell carcinoma of skin of other parts of face: Secondary | ICD-10-CM | POA: Diagnosis not present

## 2015-03-04 DIAGNOSIS — Z85828 Personal history of other malignant neoplasm of skin: Secondary | ICD-10-CM | POA: Diagnosis not present

## 2015-03-12 DIAGNOSIS — Z48298 Encounter for aftercare following other organ transplant: Secondary | ICD-10-CM | POA: Diagnosis not present

## 2015-03-12 DIAGNOSIS — Z941 Heart transplant status: Secondary | ICD-10-CM | POA: Diagnosis not present

## 2015-03-12 DIAGNOSIS — Z79899 Other long term (current) drug therapy: Secondary | ICD-10-CM | POA: Diagnosis not present

## 2015-03-13 DIAGNOSIS — H401112 Primary open-angle glaucoma, right eye, moderate stage: Secondary | ICD-10-CM | POA: Diagnosis not present

## 2015-03-13 DIAGNOSIS — H401122 Primary open-angle glaucoma, left eye, moderate stage: Secondary | ICD-10-CM | POA: Diagnosis not present

## 2015-03-16 DIAGNOSIS — E1151 Type 2 diabetes mellitus with diabetic peripheral angiopathy without gangrene: Secondary | ICD-10-CM | POA: Diagnosis not present

## 2015-03-16 DIAGNOSIS — I739 Peripheral vascular disease, unspecified: Secondary | ICD-10-CM | POA: Diagnosis not present

## 2015-03-16 DIAGNOSIS — I10 Essential (primary) hypertension: Secondary | ICD-10-CM | POA: Diagnosis not present

## 2015-03-16 DIAGNOSIS — Z6829 Body mass index (BMI) 29.0-29.9, adult: Secondary | ICD-10-CM | POA: Diagnosis not present

## 2015-03-16 DIAGNOSIS — Z941 Heart transplant status: Secondary | ICD-10-CM | POA: Diagnosis not present

## 2015-03-20 DIAGNOSIS — D0472 Carcinoma in situ of skin of left lower limb, including hip: Secondary | ICD-10-CM | POA: Diagnosis not present

## 2015-03-20 DIAGNOSIS — L03113 Cellulitis of right upper limb: Secondary | ICD-10-CM | POA: Diagnosis not present

## 2015-03-20 DIAGNOSIS — Z85828 Personal history of other malignant neoplasm of skin: Secondary | ICD-10-CM | POA: Diagnosis not present

## 2015-03-31 DIAGNOSIS — Z950 Presence of cardiac pacemaker: Secondary | ICD-10-CM | POA: Diagnosis not present

## 2015-04-01 DIAGNOSIS — L03111 Cellulitis of right axilla: Secondary | ICD-10-CM | POA: Diagnosis not present

## 2015-04-01 DIAGNOSIS — Z85828 Personal history of other malignant neoplasm of skin: Secondary | ICD-10-CM | POA: Diagnosis not present

## 2015-04-01 DIAGNOSIS — L03113 Cellulitis of right upper limb: Secondary | ICD-10-CM | POA: Diagnosis not present

## 2015-04-29 DIAGNOSIS — M25542 Pain in joints of left hand: Secondary | ICD-10-CM | POA: Diagnosis not present

## 2015-04-29 DIAGNOSIS — M79645 Pain in left finger(s): Secondary | ICD-10-CM | POA: Diagnosis not present

## 2015-04-29 DIAGNOSIS — Z6829 Body mass index (BMI) 29.0-29.9, adult: Secondary | ICD-10-CM | POA: Diagnosis not present

## 2015-04-29 DIAGNOSIS — M25532 Pain in left wrist: Secondary | ICD-10-CM | POA: Diagnosis not present

## 2015-05-07 ENCOUNTER — Encounter (HOSPITAL_COMMUNITY)
Admission: RE | Admit: 2015-05-07 | Discharge: 2015-05-07 | Disposition: A | Discharge status: Home / Self Care | Payer: Medicare Other | Source: Ambulatory Visit | Attending: Cardiology | Admitting: Cardiology

## 2015-05-07 DIAGNOSIS — I25759 Atherosclerosis of native coronary artery of transplanted heart with unspecified angina pectoris: Secondary | ICD-10-CM | POA: Insufficient documentation

## 2015-05-07 DIAGNOSIS — Z79899 Other long term (current) drug therapy: Secondary | ICD-10-CM | POA: Insufficient documentation

## 2015-05-07 DIAGNOSIS — Z941 Heart transplant status: Secondary | ICD-10-CM | POA: Insufficient documentation

## 2015-05-07 DIAGNOSIS — Z955 Presence of coronary angioplasty implant and graft: Secondary | ICD-10-CM | POA: Insufficient documentation

## 2015-05-07 NOTE — Progress Notes (Signed)
Cardiac Rehab Medication Review by a Pharmacist  Does the patient  feel that his/her medications are working for him/her?  yes  Has the patient been experiencing any side effects to the medications prescribed?  no  Does the patient measure his/her own blood pressure or blood glucose at home?  yes   Does the patient have any problems obtaining medications due to transportation or finances?   no  Understanding of regimen: excellent Understanding of indications: excellent Potential of compliance: excellent    Pharmacist comments: Mr. Provance and his wife were excited about his medical care. They were both very aware of what medications he was taking. He was unsure about what some of them were for but we talked about indications and what he can expect with taking his medications. Overall his compliance seems good other than with his latanoprost eye drops.    Melburn Popper, PharmD Clinical Pharmacy Resident Pager: [phone] 05/07/2015 8:44 AM

## 2015-05-08 DIAGNOSIS — N183 Chronic kidney disease, stage 3 (moderate): Secondary | ICD-10-CM | POA: Diagnosis not present

## 2015-05-08 DIAGNOSIS — M109 Gout, unspecified: Secondary | ICD-10-CM | POA: Diagnosis not present

## 2015-05-08 DIAGNOSIS — Z6828 Body mass index (BMI) 28.0-28.9, adult: Secondary | ICD-10-CM | POA: Diagnosis not present

## 2015-05-11 ENCOUNTER — Encounter (HOSPITAL_COMMUNITY)
Admission: RE | Admit: 2015-05-11 | Discharge: 2015-05-11 | Disposition: A | Discharge status: Home / Self Care | Payer: Medicare Other | Source: Ambulatory Visit | Attending: Cardiology | Admitting: Cardiology

## 2015-05-11 DIAGNOSIS — Z955 Presence of coronary angioplasty implant and graft: Secondary | ICD-10-CM | POA: Diagnosis not present

## 2015-05-11 DIAGNOSIS — Z79899 Other long term (current) drug therapy: Secondary | ICD-10-CM | POA: Diagnosis not present

## 2015-05-11 DIAGNOSIS — I25759 Atherosclerosis of native coronary artery of transplanted heart with unspecified angina pectoris: Secondary | ICD-10-CM | POA: Diagnosis not present

## 2015-05-11 DIAGNOSIS — Z941 Heart transplant status: Secondary | ICD-10-CM | POA: Diagnosis not present

## 2015-05-11 LAB — GLUCOSE, CAPILLARY
GLUCOSE-CAPILLARY: 245 mg/dL — AB (ref 65–99)
GLUCOSE-CAPILLARY: 227 mg/dL — AB (ref 65–99)

## 2015-05-11 NOTE — Progress Notes (Signed)
Pt started cardiac rehab today.  Pt tolerated light exercise without difficulty. VSS, telemetry-Sinus tach, asymptomatic.  Medication list reconciled.  Pt verbalized compliance with medications and denies barriers to compliance. PSYCHOSOCIAL ASSESSMENT:  PHQ-0. Pt exhibits positive coping skills, hopeful outlook with supportive family. No psychosocial needs identified at this time, no psychosocial interventions necessary.    Pt enjoys cooking.   Pt cardiac rehab  goal is  to increase his stamina.  Pt encouraged to participate in exercising on your own to increase ability to achieve these goals.   Pt long term cardiac rehab goal is to be able to travel to [place] and [place] in June.  Pt oriented to exercise equipment and routine.  Understanding verbalized. Mr Brandon [name] quality of life questionnaire reviewed. Mr Brandon [name] reports  He is slightly satisfied with the energy he has to complete his every day activities. Mr Brandon [name] reports having more energy since his stent was placed in October. Will continue to monitor the patient throughout  the program.

## 2015-05-13 ENCOUNTER — Encounter (HOSPITAL_COMMUNITY)
Admission: RE | Admit: 2015-05-13 | Discharge: 2015-05-13 | Disposition: A | Discharge status: Home / Self Care | Payer: Medicare Other | Source: Ambulatory Visit | Attending: Cardiology | Admitting: Cardiology

## 2015-05-13 DIAGNOSIS — Z79899 Other long term (current) drug therapy: Secondary | ICD-10-CM | POA: Diagnosis not present

## 2015-05-13 DIAGNOSIS — I25759 Atherosclerosis of native coronary artery of transplanted heart with unspecified angina pectoris: Secondary | ICD-10-CM | POA: Diagnosis not present

## 2015-05-13 DIAGNOSIS — Z955 Presence of coronary angioplasty implant and graft: Secondary | ICD-10-CM | POA: Diagnosis not present

## 2015-05-13 DIAGNOSIS — Z941 Heart transplant status: Secondary | ICD-10-CM | POA: Diagnosis not present

## 2015-05-13 LAB — GLUCOSE, CAPILLARY: Glucose-Capillary: 153 mg/dL — ABNORMAL HIGH (ref 65–99)

## 2015-05-14 LAB — GLUCOSE, CAPILLARY: Glucose-Capillary: 160 mg/dL — ABNORMAL HIGH (ref 65–99)

## 2015-05-15 ENCOUNTER — Encounter (HOSPITAL_COMMUNITY)
Admission: RE | Admit: 2015-05-15 | Discharge: 2015-05-15 | Disposition: A | Discharge status: Home / Self Care | Payer: Medicare Other | Source: Ambulatory Visit | Attending: Cardiology | Admitting: Cardiology

## 2015-05-15 DIAGNOSIS — Z955 Presence of coronary angioplasty implant and graft: Secondary | ICD-10-CM | POA: Diagnosis not present

## 2015-05-15 DIAGNOSIS — Z79899 Other long term (current) drug therapy: Secondary | ICD-10-CM | POA: Diagnosis not present

## 2015-05-15 DIAGNOSIS — I25759 Atherosclerosis of native coronary artery of transplanted heart with unspecified angina pectoris: Secondary | ICD-10-CM | POA: Diagnosis not present

## 2015-05-15 DIAGNOSIS — Z941 Heart transplant status: Secondary | ICD-10-CM | POA: Diagnosis not present

## 2015-05-15 LAB — GLUCOSE, CAPILLARY
GLUCOSE-CAPILLARY: 111 mg/dL — AB (ref 65–99)
Glucose-Capillary: 151 mg/dL — ABNORMAL HIGH (ref 65–99)

## 2015-05-18 ENCOUNTER — Encounter (HOSPITAL_COMMUNITY)
Admission: RE | Admit: 2015-05-18 | Discharge: 2015-05-18 | Disposition: A | Discharge status: Home / Self Care | Payer: Medicare Other | Source: Ambulatory Visit | Attending: Cardiology | Admitting: Cardiology

## 2015-05-18 DIAGNOSIS — I25759 Atherosclerosis of native coronary artery of transplanted heart with unspecified angina pectoris: Secondary | ICD-10-CM | POA: Diagnosis not present

## 2015-05-18 DIAGNOSIS — Z941 Heart transplant status: Secondary | ICD-10-CM | POA: Diagnosis not present

## 2015-05-18 DIAGNOSIS — Z955 Presence of coronary angioplasty implant and graft: Secondary | ICD-10-CM | POA: Diagnosis not present

## 2015-05-18 DIAGNOSIS — Z79899 Other long term (current) drug therapy: Secondary | ICD-10-CM | POA: Diagnosis not present

## 2015-05-18 LAB — GLUCOSE, CAPILLARY: Glucose-Capillary: 157 mg/dL — ABNORMAL HIGH (ref 65–99)

## 2015-05-20 ENCOUNTER — Encounter (HOSPITAL_COMMUNITY)
Admission: RE | Admit: 2015-05-20 | Discharge: 2015-05-20 | Disposition: A | Discharge status: Home / Self Care | Payer: Medicare Other | Source: Ambulatory Visit | Attending: Cardiology | Admitting: Cardiology

## 2015-05-20 DIAGNOSIS — Z79899 Other long term (current) drug therapy: Secondary | ICD-10-CM | POA: Diagnosis not present

## 2015-05-20 DIAGNOSIS — Z955 Presence of coronary angioplasty implant and graft: Secondary | ICD-10-CM | POA: Diagnosis not present

## 2015-05-20 DIAGNOSIS — Z941 Heart transplant status: Secondary | ICD-10-CM | POA: Diagnosis not present

## 2015-05-20 DIAGNOSIS — I25759 Atherosclerosis of native coronary artery of transplanted heart with unspecified angina pectoris: Secondary | ICD-10-CM | POA: Diagnosis not present

## 2015-05-20 LAB — GLUCOSE, CAPILLARY: Glucose-Capillary: 192 mg/dL — ABNORMAL HIGH (ref 65–99)

## 2015-05-20 NOTE — Progress Notes (Signed)
Reviewed home exercise with pt today.  Pt plans to walk and go back to [facility] for exercise.  Reviewed THR, pulse, RPE, sign and symptoms, and when to call 911 or MD.  Pt voiced understanding. Alberteen Sam, MA, ACSM RCEP

## 2015-05-22 ENCOUNTER — Encounter (HOSPITAL_COMMUNITY)
Admission: RE | Admit: 2015-05-22 | Discharge: 2015-05-22 | Disposition: A | Discharge status: Home / Self Care | Payer: Medicare Other | Source: Ambulatory Visit | Attending: Cardiology | Admitting: Cardiology

## 2015-05-22 DIAGNOSIS — Z79899 Other long term (current) drug therapy: Secondary | ICD-10-CM | POA: Diagnosis not present

## 2015-05-22 DIAGNOSIS — Z941 Heart transplant status: Secondary | ICD-10-CM | POA: Diagnosis not present

## 2015-05-22 DIAGNOSIS — Z955 Presence of coronary angioplasty implant and graft: Secondary | ICD-10-CM | POA: Diagnosis not present

## 2015-05-22 DIAGNOSIS — I25759 Atherosclerosis of native coronary artery of transplanted heart with unspecified angina pectoris: Secondary | ICD-10-CM | POA: Diagnosis not present

## 2015-05-22 LAB — GLUCOSE, CAPILLARY
GLUCOSE-CAPILLARY: 104 mg/dL — AB (ref 65–99)
GLUCOSE-CAPILLARY: 123 mg/dL — AB (ref 65–99)

## 2015-05-22 NOTE — Progress Notes (Signed)
Brandon [name]'s weight is up 1.2 kg from Wednesday. Brandon [name] denies shortness of breath. Upon assessment lung fields are clear upon ascultation. No peripheral edema noted. Oxygen saturation 98% on room air. [facility] Coordinator notified. Will fax exercise flow sheets to Dr. Jorja Loa office for review.

## 2015-05-25 ENCOUNTER — Encounter (HOSPITAL_COMMUNITY)
Admission: RE | Admit: 2015-05-25 | Discharge: 2015-05-25 | Disposition: A | Discharge status: Home / Self Care | Payer: Medicare Other | Source: Ambulatory Visit | Attending: Cardiology | Admitting: Cardiology

## 2015-05-25 DIAGNOSIS — I25759 Atherosclerosis of native coronary artery of transplanted heart with unspecified angina pectoris: Secondary | ICD-10-CM | POA: Diagnosis not present

## 2015-05-25 DIAGNOSIS — Z79899 Other long term (current) drug therapy: Secondary | ICD-10-CM | POA: Diagnosis not present

## 2015-05-25 DIAGNOSIS — Z941 Heart transplant status: Secondary | ICD-10-CM | POA: Diagnosis not present

## 2015-05-25 DIAGNOSIS — Z955 Presence of coronary angioplasty implant and graft: Secondary | ICD-10-CM | POA: Diagnosis not present

## 2015-05-25 LAB — GLUCOSE, CAPILLARY: GLUCOSE-CAPILLARY: 206 mg/dL — AB (ref 65–99)

## 2015-05-27 ENCOUNTER — Encounter (HOSPITAL_COMMUNITY)
Admission: RE | Admit: 2015-05-27 | Discharge: 2015-05-27 | Disposition: A | Discharge status: Home / Self Care | Payer: Medicare Other | Source: Ambulatory Visit | Attending: Cardiology | Admitting: Cardiology

## 2015-05-27 DIAGNOSIS — I25759 Atherosclerosis of native coronary artery of transplanted heart with unspecified angina pectoris: Secondary | ICD-10-CM | POA: Diagnosis not present

## 2015-05-27 DIAGNOSIS — Z955 Presence of coronary angioplasty implant and graft: Secondary | ICD-10-CM | POA: Diagnosis not present

## 2015-05-27 DIAGNOSIS — Z941 Heart transplant status: Secondary | ICD-10-CM | POA: Diagnosis not present

## 2015-05-27 DIAGNOSIS — Z79899 Other long term (current) drug therapy: Secondary | ICD-10-CM | POA: Diagnosis not present

## 2015-05-27 LAB — GLUCOSE, CAPILLARY: GLUCOSE-CAPILLARY: 146 mg/dL — AB (ref 65–99)

## 2015-05-29 ENCOUNTER — Encounter (HOSPITAL_COMMUNITY)
Admission: RE | Admit: 2015-05-29 | Discharge: 2015-05-29 | Disposition: A | Discharge status: Home / Self Care | Payer: Medicare Other | Source: Ambulatory Visit | Attending: Cardiology | Admitting: Cardiology

## 2015-05-29 DIAGNOSIS — Z79899 Other long term (current) drug therapy: Secondary | ICD-10-CM | POA: Diagnosis not present

## 2015-05-29 DIAGNOSIS — I25759 Atherosclerosis of native coronary artery of transplanted heart with unspecified angina pectoris: Secondary | ICD-10-CM | POA: Diagnosis not present

## 2015-05-29 DIAGNOSIS — Z955 Presence of coronary angioplasty implant and graft: Secondary | ICD-10-CM | POA: Diagnosis not present

## 2015-05-29 DIAGNOSIS — Z941 Heart transplant status: Secondary | ICD-10-CM | POA: Diagnosis not present

## 2015-06-01 ENCOUNTER — Encounter (HOSPITAL_COMMUNITY)
Admission: RE | Admit: 2015-06-01 | Discharge: 2015-06-01 | Disposition: A | Discharge status: Home / Self Care | Payer: Medicare Other | Source: Ambulatory Visit | Attending: Cardiology | Admitting: Cardiology

## 2015-06-01 DIAGNOSIS — I25759 Atherosclerosis of native coronary artery of transplanted heart with unspecified angina pectoris: Secondary | ICD-10-CM | POA: Diagnosis not present

## 2015-06-01 DIAGNOSIS — Z941 Heart transplant status: Secondary | ICD-10-CM | POA: Diagnosis not present

## 2015-06-01 DIAGNOSIS — Z79899 Other long term (current) drug therapy: Secondary | ICD-10-CM | POA: Diagnosis not present

## 2015-06-01 DIAGNOSIS — Z955 Presence of coronary angioplasty implant and graft: Secondary | ICD-10-CM | POA: Diagnosis not present

## 2015-06-01 NOTE — Progress Notes (Signed)
Brandon [name] 75 y.o. male Nutrition Note Spoke with pt. Nutrition Plan and Nutrition Survey goals reviewed with pt. Pt is following Step 1 of the Therapeutic Lifestyle Changes diet.  Pt is diabetic. Last A1c indicates blood glucose not optimally controlled. This Probation officer went over Diabetes Education test results. Pt checks CBG's 4 times a day. Fasting CBG's reportedly "in the 90's to low 100's." Pt states he has learned to adjust his insulin due to chronic prednisone use. Pt is not currently taking Calcium or vitamin D supplements. Pt expressed understanding of the information reviewed. Pt aware of nutrition education classes offered and plans on attending nutrition classes.  No results found for: HGBA1C  Nutrition Diagnosis ? Food-and nutrition-related knowledge deficit related to lack of exposure to information as related to diagnosis of: ? CVD ? DM ? Overweight related to excessive energy intake as evidenced by a BMI of 28.8  Nutrition RX/ Estimated Daily Nutrition Needs for: wt loss 1400-1900 Kcal, 35-50 gm fat, 9-13 gm sat fat, 1.4-1.9 gm trans-fat, <1500 mg sodium, 175-250 gm CHO   Nutrition Intervention ? Pt's individual nutrition plan reviewed with pt. ? Benefits of adopting Therapeutic Lifestyle Changes discussed when Medficts reviewed. ? Pt to attend the Portion Distortion class ? Pt to attend the Diabetes Q & A class ? Pt to attend the   ? Nutrition I class                  ? Nutrition II class - met; 05/12/15     ? Diabetes Blitz class  ? Continue client-centered nutrition education by RD, as part of interdisciplinary care. Goal(s) ? Pt to identify and limit food sources of saturated fat, trans fat, and sodium ? Pt to identify food quantities necessary to achieve: ? wt loss to a goal wt loss of 6-24 lb (2.7-10.9 kg) at graduation from cardiac rehab.  ? CBG concentrations in the normal range or as close to normal as is safely possible. Monitor and Evaluate progress toward  nutrition goal with team. Nutrition Risk: Change to Moderate Derek Mound, M.Ed, RD, LDN, CDE 06/01/2015 10:45 AM

## 2015-06-03 ENCOUNTER — Encounter (HOSPITAL_COMMUNITY)
Admission: RE | Admit: 2015-06-03 | Discharge: 2015-06-03 | Disposition: A | Discharge status: Home / Self Care | Payer: Medicare Other | Source: Ambulatory Visit | Attending: Cardiology | Admitting: Cardiology

## 2015-06-03 DIAGNOSIS — Z79899 Other long term (current) drug therapy: Secondary | ICD-10-CM | POA: Insufficient documentation

## 2015-06-03 DIAGNOSIS — Z955 Presence of coronary angioplasty implant and graft: Secondary | ICD-10-CM | POA: Diagnosis not present

## 2015-06-03 DIAGNOSIS — Z941 Heart transplant status: Secondary | ICD-10-CM | POA: Insufficient documentation

## 2015-06-03 DIAGNOSIS — I25759 Atherosclerosis of native coronary artery of transplanted heart with unspecified angina pectoris: Secondary | ICD-10-CM | POA: Insufficient documentation

## 2015-06-05 ENCOUNTER — Encounter (HOSPITAL_COMMUNITY)
Admission: RE | Admit: 2015-06-05 | Discharge: 2015-06-05 | Disposition: A | Discharge status: Home / Self Care | Payer: Medicare Other | Source: Ambulatory Visit | Attending: Cardiology | Admitting: Cardiology

## 2015-06-05 DIAGNOSIS — I25759 Atherosclerosis of native coronary artery of transplanted heart with unspecified angina pectoris: Secondary | ICD-10-CM | POA: Diagnosis not present

## 2015-06-05 DIAGNOSIS — Z955 Presence of coronary angioplasty implant and graft: Secondary | ICD-10-CM | POA: Diagnosis not present

## 2015-06-05 DIAGNOSIS — Z941 Heart transplant status: Secondary | ICD-10-CM | POA: Diagnosis not present

## 2015-06-05 DIAGNOSIS — Z79899 Other long term (current) drug therapy: Secondary | ICD-10-CM | POA: Diagnosis not present

## 2015-06-08 ENCOUNTER — Telehealth (HOSPITAL_COMMUNITY): Payer: Self-pay | Admitting: Internal Medicine

## 2015-06-08 ENCOUNTER — Encounter (HOSPITAL_COMMUNITY): Payer: Medicare Other

## 2015-06-10 ENCOUNTER — Encounter (HOSPITAL_COMMUNITY)
Admission: RE | Admit: 2015-06-10 | Discharge: 2015-06-10 | Disposition: A | Discharge status: Home / Self Care | Payer: Medicare Other | Source: Ambulatory Visit | Attending: Cardiology | Admitting: Cardiology

## 2015-06-10 DIAGNOSIS — I25759 Atherosclerosis of native coronary artery of transplanted heart with unspecified angina pectoris: Secondary | ICD-10-CM | POA: Diagnosis not present

## 2015-06-10 DIAGNOSIS — Z79899 Other long term (current) drug therapy: Secondary | ICD-10-CM | POA: Diagnosis not present

## 2015-06-10 DIAGNOSIS — Z941 Heart transplant status: Secondary | ICD-10-CM | POA: Diagnosis not present

## 2015-06-10 DIAGNOSIS — Z955 Presence of coronary angioplasty implant and graft: Secondary | ICD-10-CM | POA: Diagnosis not present

## 2015-06-10 LAB — GLUCOSE, CAPILLARY
GLUCOSE-CAPILLARY: 58 mg/dL — AB (ref 65–99)
GLUCOSE-CAPILLARY: 168 mg/dL — AB (ref 65–99)
Glucose-Capillary: 80 mg/dL (ref 65–99)

## 2015-06-10 NOTE — Progress Notes (Signed)
Patient said he felt his blood sugar was low. Ed checked his blood sugar with his own meter it was 61. Ed had checked it in the car prior to coming to cardiac rehab it was 147. Patient shaky and diaphoretic given glucose gel and a 1/2 ginger ale CBG 58.  Recheck  CBG 80. [name] reported feeling better. Patient given the other 1/2 of a can of ginger ale. Repeat CBG 168. Dr Quillian Quince Paterson's office notified about low CBG. Ed did not exercise today. Patient left cardiac rehab without complaints or symptoms. Ed plans to return to exercise on Wednesday.

## 2015-06-12 ENCOUNTER — Encounter (HOSPITAL_COMMUNITY)
Admission: RE | Admit: 2015-06-12 | Discharge: 2015-06-12 | Disposition: A | Discharge status: Home / Self Care | Payer: Medicare Other | Source: Ambulatory Visit | Attending: Cardiology | Admitting: Cardiology

## 2015-06-12 DIAGNOSIS — Z955 Presence of coronary angioplasty implant and graft: Secondary | ICD-10-CM | POA: Diagnosis not present

## 2015-06-12 DIAGNOSIS — Z941 Heart transplant status: Secondary | ICD-10-CM | POA: Diagnosis not present

## 2015-06-12 DIAGNOSIS — Z79899 Other long term (current) drug therapy: Secondary | ICD-10-CM | POA: Diagnosis not present

## 2015-06-12 DIAGNOSIS — I25759 Atherosclerosis of native coronary artery of transplanted heart with unspecified angina pectoris: Secondary | ICD-10-CM | POA: Diagnosis not present

## 2015-06-12 LAB — GLUCOSE, CAPILLARY: Glucose-Capillary: 187 mg/dL — ABNORMAL HIGH (ref 65–99)

## 2015-06-15 ENCOUNTER — Encounter (HOSPITAL_COMMUNITY)
Admission: RE | Admit: 2015-06-15 | Discharge: 2015-06-15 | Disposition: A | Discharge status: Home / Self Care | Payer: Medicare Other | Source: Ambulatory Visit | Attending: Cardiology | Admitting: Cardiology

## 2015-06-15 DIAGNOSIS — I25759 Atherosclerosis of native coronary artery of transplanted heart with unspecified angina pectoris: Secondary | ICD-10-CM | POA: Diagnosis not present

## 2015-06-15 DIAGNOSIS — Z955 Presence of coronary angioplasty implant and graft: Secondary | ICD-10-CM | POA: Diagnosis not present

## 2015-06-15 DIAGNOSIS — Z941 Heart transplant status: Secondary | ICD-10-CM | POA: Diagnosis not present

## 2015-06-15 DIAGNOSIS — Z79899 Other long term (current) drug therapy: Secondary | ICD-10-CM | POA: Diagnosis not present

## 2015-06-17 ENCOUNTER — Encounter (HOSPITAL_COMMUNITY): Payer: Medicare Other

## 2015-06-19 ENCOUNTER — Encounter (HOSPITAL_COMMUNITY): Payer: Medicare Other

## 2015-06-19 DIAGNOSIS — H401132 Primary open-angle glaucoma, bilateral, moderate stage: Secondary | ICD-10-CM | POA: Diagnosis not present

## 2015-06-22 ENCOUNTER — Encounter (HOSPITAL_COMMUNITY)
Admission: RE | Admit: 2015-06-22 | Discharge: 2015-06-22 | Disposition: A | Discharge status: Home / Self Care | Payer: Medicare Other | Source: Ambulatory Visit | Attending: Cardiology | Admitting: Cardiology

## 2015-06-22 DIAGNOSIS — Z941 Heart transplant status: Secondary | ICD-10-CM | POA: Diagnosis not present

## 2015-06-22 DIAGNOSIS — I25759 Atherosclerosis of native coronary artery of transplanted heart with unspecified angina pectoris: Secondary | ICD-10-CM | POA: Diagnosis not present

## 2015-06-22 DIAGNOSIS — Z79899 Other long term (current) drug therapy: Secondary | ICD-10-CM | POA: Diagnosis not present

## 2015-06-22 DIAGNOSIS — Z955 Presence of coronary angioplasty implant and graft: Secondary | ICD-10-CM | POA: Diagnosis not present

## 2015-06-24 ENCOUNTER — Encounter (HOSPITAL_COMMUNITY)
Admission: RE | Admit: 2015-06-24 | Discharge: 2015-06-24 | Disposition: A | Discharge status: Home / Self Care | Payer: Medicare Other | Source: Ambulatory Visit | Attending: Cardiology | Admitting: Cardiology

## 2015-06-24 DIAGNOSIS — I25759 Atherosclerosis of native coronary artery of transplanted heart with unspecified angina pectoris: Secondary | ICD-10-CM | POA: Diagnosis not present

## 2015-06-24 DIAGNOSIS — Z955 Presence of coronary angioplasty implant and graft: Secondary | ICD-10-CM | POA: Diagnosis not present

## 2015-06-24 DIAGNOSIS — Z79899 Other long term (current) drug therapy: Secondary | ICD-10-CM | POA: Diagnosis not present

## 2015-06-24 DIAGNOSIS — Z941 Heart transplant status: Secondary | ICD-10-CM | POA: Diagnosis not present

## 2015-06-24 LAB — GLUCOSE, CAPILLARY: Glucose-Capillary: 170 mg/dL — ABNORMAL HIGH (ref 65–99)

## 2015-06-26 ENCOUNTER — Encounter (HOSPITAL_COMMUNITY)
Admission: RE | Admit: 2015-06-26 | Discharge: 2015-06-26 | Disposition: A | Discharge status: Home / Self Care | Payer: Medicare Other | Source: Ambulatory Visit | Attending: Cardiology | Admitting: Cardiology

## 2015-06-26 DIAGNOSIS — Z955 Presence of coronary angioplasty implant and graft: Secondary | ICD-10-CM | POA: Diagnosis not present

## 2015-06-26 DIAGNOSIS — Z79899 Other long term (current) drug therapy: Secondary | ICD-10-CM | POA: Diagnosis not present

## 2015-06-26 DIAGNOSIS — Z941 Heart transplant status: Secondary | ICD-10-CM | POA: Diagnosis not present

## 2015-06-26 DIAGNOSIS — I25759 Atherosclerosis of native coronary artery of transplanted heart with unspecified angina pectoris: Secondary | ICD-10-CM | POA: Diagnosis not present

## 2015-06-29 ENCOUNTER — Encounter (HOSPITAL_COMMUNITY)
Admission: RE | Admit: 2015-06-29 | Discharge: 2015-06-29 | Disposition: A | Discharge status: Home / Self Care | Payer: Medicare Other | Source: Ambulatory Visit | Attending: Cardiology | Admitting: Cardiology

## 2015-06-29 DIAGNOSIS — Z941 Heart transplant status: Secondary | ICD-10-CM | POA: Diagnosis not present

## 2015-06-29 DIAGNOSIS — Z79899 Other long term (current) drug therapy: Secondary | ICD-10-CM | POA: Diagnosis not present

## 2015-06-29 DIAGNOSIS — I25759 Atherosclerosis of native coronary artery of transplanted heart with unspecified angina pectoris: Secondary | ICD-10-CM | POA: Diagnosis not present

## 2015-06-29 DIAGNOSIS — Z955 Presence of coronary angioplasty implant and graft: Secondary | ICD-10-CM | POA: Diagnosis not present

## 2015-06-30 DIAGNOSIS — Z950 Presence of cardiac pacemaker: Secondary | ICD-10-CM | POA: Diagnosis not present

## 2015-06-30 DIAGNOSIS — R55 Syncope and collapse: Secondary | ICD-10-CM | POA: Diagnosis not present

## 2015-06-30 DIAGNOSIS — Z941 Heart transplant status: Secondary | ICD-10-CM | POA: Diagnosis not present

## 2015-07-01 ENCOUNTER — Encounter (HOSPITAL_COMMUNITY)
Admission: RE | Admit: 2015-07-01 | Discharge: 2015-07-01 | Disposition: A | Discharge status: Home / Self Care | Payer: Medicare Other | Source: Ambulatory Visit | Attending: Cardiology | Admitting: Cardiology

## 2015-07-01 DIAGNOSIS — Z941 Heart transplant status: Secondary | ICD-10-CM | POA: Insufficient documentation

## 2015-07-01 DIAGNOSIS — Z79899 Other long term (current) drug therapy: Secondary | ICD-10-CM | POA: Diagnosis not present

## 2015-07-01 DIAGNOSIS — Z955 Presence of coronary angioplasty implant and graft: Secondary | ICD-10-CM | POA: Insufficient documentation

## 2015-07-01 DIAGNOSIS — I25759 Atherosclerosis of native coronary artery of transplanted heart with unspecified angina pectoris: Secondary | ICD-10-CM | POA: Diagnosis not present

## 2015-07-03 ENCOUNTER — Encounter (HOSPITAL_COMMUNITY)
Admission: RE | Admit: 2015-07-03 | Discharge: 2015-07-03 | Disposition: A | Discharge status: Home / Self Care | Payer: Medicare Other | Source: Ambulatory Visit | Attending: Cardiology | Admitting: Cardiology

## 2015-07-03 DIAGNOSIS — Z955 Presence of coronary angioplasty implant and graft: Secondary | ICD-10-CM | POA: Diagnosis not present

## 2015-07-03 DIAGNOSIS — Z79899 Other long term (current) drug therapy: Secondary | ICD-10-CM | POA: Diagnosis not present

## 2015-07-03 DIAGNOSIS — I25759 Atherosclerosis of native coronary artery of transplanted heart with unspecified angina pectoris: Secondary | ICD-10-CM | POA: Diagnosis not present

## 2015-07-03 DIAGNOSIS — Z941 Heart transplant status: Secondary | ICD-10-CM | POA: Diagnosis not present

## 2015-07-06 ENCOUNTER — Encounter (HOSPITAL_COMMUNITY)
Admission: RE | Admit: 2015-07-06 | Discharge: 2015-07-06 | Disposition: A | Discharge status: Home / Self Care | Payer: Medicare Other | Source: Ambulatory Visit | Attending: Cardiology | Admitting: Cardiology

## 2015-07-06 DIAGNOSIS — I25759 Atherosclerosis of native coronary artery of transplanted heart with unspecified angina pectoris: Secondary | ICD-10-CM | POA: Diagnosis not present

## 2015-07-06 DIAGNOSIS — Z955 Presence of coronary angioplasty implant and graft: Secondary | ICD-10-CM | POA: Diagnosis not present

## 2015-07-06 DIAGNOSIS — Z79899 Other long term (current) drug therapy: Secondary | ICD-10-CM | POA: Diagnosis not present

## 2015-07-06 DIAGNOSIS — Z941 Heart transplant status: Secondary | ICD-10-CM | POA: Diagnosis not present

## 2015-07-08 ENCOUNTER — Encounter (HOSPITAL_COMMUNITY)
Admission: RE | Admit: 2015-07-08 | Discharge: 2015-07-08 | Disposition: A | Discharge status: Home / Self Care | Payer: Medicare Other | Source: Ambulatory Visit | Attending: Cardiology | Admitting: Cardiology

## 2015-07-08 DIAGNOSIS — Z941 Heart transplant status: Secondary | ICD-10-CM | POA: Diagnosis not present

## 2015-07-08 DIAGNOSIS — Z79899 Other long term (current) drug therapy: Secondary | ICD-10-CM | POA: Diagnosis not present

## 2015-07-08 DIAGNOSIS — Z955 Presence of coronary angioplasty implant and graft: Secondary | ICD-10-CM | POA: Diagnosis not present

## 2015-07-08 DIAGNOSIS — I25759 Atherosclerosis of native coronary artery of transplanted heart with unspecified angina pectoris: Secondary | ICD-10-CM | POA: Diagnosis not present

## 2015-07-10 ENCOUNTER — Encounter (HOSPITAL_COMMUNITY)
Admission: RE | Admit: 2015-07-10 | Discharge: 2015-07-10 | Disposition: A | Discharge status: Home / Self Care | Payer: Medicare Other | Source: Ambulatory Visit | Attending: Cardiology | Admitting: Cardiology

## 2015-07-10 DIAGNOSIS — Z941 Heart transplant status: Secondary | ICD-10-CM | POA: Diagnosis not present

## 2015-07-10 DIAGNOSIS — I25759 Atherosclerosis of native coronary artery of transplanted heart with unspecified angina pectoris: Secondary | ICD-10-CM | POA: Diagnosis not present

## 2015-07-10 DIAGNOSIS — Z955 Presence of coronary angioplasty implant and graft: Secondary | ICD-10-CM | POA: Diagnosis not present

## 2015-07-10 DIAGNOSIS — Z79899 Other long term (current) drug therapy: Secondary | ICD-10-CM | POA: Diagnosis not present

## 2015-07-13 ENCOUNTER — Encounter (HOSPITAL_COMMUNITY)
Admission: RE | Admit: 2015-07-13 | Discharge: 2015-07-13 | Disposition: A | Discharge status: Home / Self Care | Payer: Medicare Other | Source: Ambulatory Visit | Attending: Cardiology | Admitting: Cardiology

## 2015-07-13 DIAGNOSIS — I25759 Atherosclerosis of native coronary artery of transplanted heart with unspecified angina pectoris: Secondary | ICD-10-CM | POA: Diagnosis not present

## 2015-07-13 DIAGNOSIS — Z941 Heart transplant status: Secondary | ICD-10-CM | POA: Diagnosis not present

## 2015-07-13 DIAGNOSIS — Z79899 Other long term (current) drug therapy: Secondary | ICD-10-CM | POA: Diagnosis not present

## 2015-07-13 DIAGNOSIS — Z955 Presence of coronary angioplasty implant and graft: Secondary | ICD-10-CM | POA: Diagnosis not present

## 2015-07-14 DIAGNOSIS — E782 Mixed hyperlipidemia: Secondary | ICD-10-CM | POA: Diagnosis not present

## 2015-07-14 DIAGNOSIS — N183 Chronic kidney disease, stage 3 (moderate): Secondary | ICD-10-CM | POA: Diagnosis not present

## 2015-07-14 DIAGNOSIS — Z125 Encounter for screening for malignant neoplasm of prostate: Secondary | ICD-10-CM | POA: Diagnosis not present

## 2015-07-14 DIAGNOSIS — M109 Gout, unspecified: Secondary | ICD-10-CM | POA: Diagnosis not present

## 2015-07-14 DIAGNOSIS — E1151 Type 2 diabetes mellitus with diabetic peripheral angiopathy without gangrene: Secondary | ICD-10-CM | POA: Diagnosis not present

## 2015-07-15 ENCOUNTER — Encounter (HOSPITAL_COMMUNITY)
Admission: RE | Admit: 2015-07-15 | Discharge: 2015-07-15 | Disposition: A | Discharge status: Home / Self Care | Payer: Medicare Other | Source: Ambulatory Visit | Attending: Cardiology | Admitting: Cardiology

## 2015-07-15 DIAGNOSIS — Z941 Heart transplant status: Secondary | ICD-10-CM | POA: Diagnosis not present

## 2015-07-15 DIAGNOSIS — Z955 Presence of coronary angioplasty implant and graft: Secondary | ICD-10-CM | POA: Diagnosis not present

## 2015-07-15 DIAGNOSIS — Z79899 Other long term (current) drug therapy: Secondary | ICD-10-CM | POA: Diagnosis not present

## 2015-07-15 DIAGNOSIS — I25759 Atherosclerosis of native coronary artery of transplanted heart with unspecified angina pectoris: Secondary | ICD-10-CM | POA: Diagnosis not present

## 2015-07-17 ENCOUNTER — Encounter (HOSPITAL_COMMUNITY)
Admission: RE | Admit: 2015-07-17 | Discharge: 2015-07-17 | Disposition: A | Discharge status: Home / Self Care | Payer: Medicare Other | Source: Ambulatory Visit | Attending: Cardiology | Admitting: Cardiology

## 2015-07-17 DIAGNOSIS — Z941 Heart transplant status: Secondary | ICD-10-CM | POA: Diagnosis not present

## 2015-07-17 DIAGNOSIS — Z79899 Other long term (current) drug therapy: Secondary | ICD-10-CM | POA: Diagnosis not present

## 2015-07-17 DIAGNOSIS — Z955 Presence of coronary angioplasty implant and graft: Secondary | ICD-10-CM | POA: Diagnosis not present

## 2015-07-17 DIAGNOSIS — I25759 Atherosclerosis of native coronary artery of transplanted heart with unspecified angina pectoris: Secondary | ICD-10-CM | POA: Diagnosis not present

## 2015-07-20 ENCOUNTER — Encounter (HOSPITAL_COMMUNITY)
Admission: RE | Admit: 2015-07-20 | Discharge: 2015-07-20 | Disposition: A | Discharge status: Home / Self Care | Payer: Medicare Other | Source: Ambulatory Visit | Attending: Cardiology | Admitting: Cardiology

## 2015-07-20 DIAGNOSIS — I25759 Atherosclerosis of native coronary artery of transplanted heart with unspecified angina pectoris: Secondary | ICD-10-CM | POA: Diagnosis not present

## 2015-07-20 DIAGNOSIS — Z79899 Other long term (current) drug therapy: Secondary | ICD-10-CM | POA: Diagnosis not present

## 2015-07-20 DIAGNOSIS — Z955 Presence of coronary angioplasty implant and graft: Secondary | ICD-10-CM | POA: Diagnosis not present

## 2015-07-20 DIAGNOSIS — Z941 Heart transplant status: Secondary | ICD-10-CM | POA: Diagnosis not present

## 2015-07-21 DIAGNOSIS — Z1212 Encounter for screening for malignant neoplasm of rectum: Secondary | ICD-10-CM | POA: Diagnosis not present

## 2015-07-22 ENCOUNTER — Encounter (HOSPITAL_COMMUNITY)
Admission: RE | Admit: 2015-07-22 | Discharge: 2015-07-22 | Disposition: A | Discharge status: Home / Self Care | Payer: Medicare Other | Source: Ambulatory Visit | Attending: Cardiology | Admitting: Cardiology

## 2015-07-22 DIAGNOSIS — Z955 Presence of coronary angioplasty implant and graft: Secondary | ICD-10-CM | POA: Diagnosis not present

## 2015-07-22 DIAGNOSIS — I25759 Atherosclerosis of native coronary artery of transplanted heart with unspecified angina pectoris: Secondary | ICD-10-CM | POA: Diagnosis not present

## 2015-07-22 DIAGNOSIS — Z79899 Other long term (current) drug therapy: Secondary | ICD-10-CM | POA: Diagnosis not present

## 2015-07-22 DIAGNOSIS — Z941 Heart transplant status: Secondary | ICD-10-CM | POA: Diagnosis not present

## 2015-07-23 DIAGNOSIS — E1151 Type 2 diabetes mellitus with diabetic peripheral angiopathy without gangrene: Secondary | ICD-10-CM | POA: Diagnosis not present

## 2015-07-23 DIAGNOSIS — Z6829 Body mass index (BMI) 29.0-29.9, adult: Secondary | ICD-10-CM | POA: Diagnosis not present

## 2015-07-23 DIAGNOSIS — E782 Mixed hyperlipidemia: Secondary | ICD-10-CM | POA: Diagnosis not present

## 2015-07-23 DIAGNOSIS — Z0000 Encounter for general adult medical examination without abnormal findings: Secondary | ICD-10-CM | POA: Diagnosis not present

## 2015-07-23 DIAGNOSIS — I7389 Other specified peripheral vascular diseases: Secondary | ICD-10-CM | POA: Diagnosis not present

## 2015-07-23 DIAGNOSIS — N183 Chronic kidney disease, stage 3 (moderate): Secondary | ICD-10-CM | POA: Diagnosis not present

## 2015-07-23 DIAGNOSIS — M109 Gout, unspecified: Secondary | ICD-10-CM | POA: Diagnosis not present

## 2015-07-23 DIAGNOSIS — I10 Essential (primary) hypertension: Secondary | ICD-10-CM | POA: Diagnosis not present

## 2015-07-23 DIAGNOSIS — G2581 Restless legs syndrome: Secondary | ICD-10-CM | POA: Diagnosis not present

## 2015-07-23 DIAGNOSIS — Z1389 Encounter for screening for other disorder: Secondary | ICD-10-CM | POA: Diagnosis not present

## 2015-07-23 DIAGNOSIS — Z941 Heart transplant status: Secondary | ICD-10-CM | POA: Diagnosis not present

## 2015-07-24 ENCOUNTER — Encounter (HOSPITAL_COMMUNITY)
Admission: RE | Admit: 2015-07-24 | Discharge: 2015-07-24 | Disposition: A | Discharge status: Home / Self Care | Payer: Medicare Other | Source: Ambulatory Visit | Attending: Cardiology | Admitting: Cardiology

## 2015-07-24 DIAGNOSIS — I25759 Atherosclerosis of native coronary artery of transplanted heart with unspecified angina pectoris: Secondary | ICD-10-CM | POA: Diagnosis not present

## 2015-07-24 DIAGNOSIS — Z79899 Other long term (current) drug therapy: Secondary | ICD-10-CM | POA: Diagnosis not present

## 2015-07-24 DIAGNOSIS — Z955 Presence of coronary angioplasty implant and graft: Secondary | ICD-10-CM | POA: Diagnosis not present

## 2015-07-24 DIAGNOSIS — Z941 Heart transplant status: Secondary | ICD-10-CM | POA: Diagnosis not present

## 2015-07-27 ENCOUNTER — Encounter (HOSPITAL_COMMUNITY)
Admission: RE | Admit: 2015-07-27 | Discharge: 2015-07-27 | Disposition: A | Discharge status: Home / Self Care | Payer: Medicare Other | Source: Ambulatory Visit | Attending: Cardiology | Admitting: Cardiology

## 2015-07-27 DIAGNOSIS — Z941 Heart transplant status: Secondary | ICD-10-CM | POA: Diagnosis not present

## 2015-07-27 DIAGNOSIS — Z79899 Other long term (current) drug therapy: Secondary | ICD-10-CM | POA: Diagnosis not present

## 2015-07-27 DIAGNOSIS — Z955 Presence of coronary angioplasty implant and graft: Secondary | ICD-10-CM | POA: Diagnosis not present

## 2015-07-27 DIAGNOSIS — I25759 Atherosclerosis of native coronary artery of transplanted heart with unspecified angina pectoris: Secondary | ICD-10-CM | POA: Diagnosis not present

## 2015-07-29 ENCOUNTER — Encounter (HOSPITAL_COMMUNITY)
Admission: RE | Admit: 2015-07-29 | Discharge: 2015-07-29 | Disposition: A | Discharge status: Home / Self Care | Payer: Medicare Other | Source: Ambulatory Visit | Attending: Cardiology | Admitting: Cardiology

## 2015-07-29 DIAGNOSIS — Z79899 Other long term (current) drug therapy: Secondary | ICD-10-CM | POA: Diagnosis not present

## 2015-07-29 DIAGNOSIS — Z955 Presence of coronary angioplasty implant and graft: Secondary | ICD-10-CM | POA: Diagnosis not present

## 2015-07-29 DIAGNOSIS — Z941 Heart transplant status: Secondary | ICD-10-CM | POA: Diagnosis not present

## 2015-07-29 DIAGNOSIS — I25759 Atherosclerosis of native coronary artery of transplanted heart with unspecified angina pectoris: Secondary | ICD-10-CM | POA: Diagnosis not present

## 2015-07-31 ENCOUNTER — Encounter (HOSPITAL_COMMUNITY)
Admission: RE | Admit: 2015-07-31 | Discharge: 2015-07-31 | Disposition: A | Discharge status: Home / Self Care | Payer: Medicare Other | Source: Ambulatory Visit | Attending: Cardiology | Admitting: Cardiology

## 2015-07-31 DIAGNOSIS — Z941 Heart transplant status: Secondary | ICD-10-CM | POA: Diagnosis not present

## 2015-07-31 DIAGNOSIS — Z79899 Other long term (current) drug therapy: Secondary | ICD-10-CM | POA: Diagnosis not present

## 2015-07-31 DIAGNOSIS — I25759 Atherosclerosis of native coronary artery of transplanted heart with unspecified angina pectoris: Secondary | ICD-10-CM | POA: Diagnosis not present

## 2015-07-31 DIAGNOSIS — Z955 Presence of coronary angioplasty implant and graft: Secondary | ICD-10-CM | POA: Diagnosis not present

## 2015-08-03 ENCOUNTER — Encounter (HOSPITAL_COMMUNITY)
Admission: RE | Admit: 2015-08-03 | Discharge: 2015-08-03 | Disposition: A | Discharge status: Home / Self Care | Payer: Medicare Other | Source: Ambulatory Visit | Attending: Cardiology | Admitting: Cardiology

## 2015-08-03 DIAGNOSIS — I25759 Atherosclerosis of native coronary artery of transplanted heart with unspecified angina pectoris: Secondary | ICD-10-CM | POA: Diagnosis not present

## 2015-08-03 DIAGNOSIS — Z79899 Other long term (current) drug therapy: Secondary | ICD-10-CM | POA: Diagnosis not present

## 2015-08-03 DIAGNOSIS — Z955 Presence of coronary angioplasty implant and graft: Secondary | ICD-10-CM | POA: Insufficient documentation

## 2015-08-03 DIAGNOSIS — Z941 Heart transplant status: Secondary | ICD-10-CM | POA: Diagnosis not present

## 2015-08-05 ENCOUNTER — Encounter (HOSPITAL_COMMUNITY): Payer: Medicare Other

## 2015-08-05 DIAGNOSIS — D485 Neoplasm of uncertain behavior of skin: Secondary | ICD-10-CM | POA: Diagnosis not present

## 2015-08-05 DIAGNOSIS — L821 Other seborrheic keratosis: Secondary | ICD-10-CM | POA: Diagnosis not present

## 2015-08-05 DIAGNOSIS — D692 Other nonthrombocytopenic purpura: Secondary | ICD-10-CM | POA: Diagnosis not present

## 2015-08-05 DIAGNOSIS — D1801 Hemangioma of skin and subcutaneous tissue: Secondary | ICD-10-CM | POA: Diagnosis not present

## 2015-08-05 DIAGNOSIS — L814 Other melanin hyperpigmentation: Secondary | ICD-10-CM | POA: Diagnosis not present

## 2015-08-05 DIAGNOSIS — D225 Melanocytic nevi of trunk: Secondary | ICD-10-CM | POA: Diagnosis not present

## 2015-08-05 DIAGNOSIS — Z85828 Personal history of other malignant neoplasm of skin: Secondary | ICD-10-CM | POA: Diagnosis not present

## 2015-08-05 DIAGNOSIS — L570 Actinic keratosis: Secondary | ICD-10-CM | POA: Diagnosis not present

## 2015-08-05 DIAGNOSIS — L820 Inflamed seborrheic keratosis: Secondary | ICD-10-CM | POA: Diagnosis not present

## 2015-08-07 ENCOUNTER — Encounter (HOSPITAL_COMMUNITY)
Admission: RE | Admit: 2015-08-07 | Discharge: 2015-08-07 | Disposition: A | Discharge status: Home / Self Care | Payer: Medicare Other | Source: Ambulatory Visit | Attending: Cardiology | Admitting: Cardiology

## 2015-08-07 DIAGNOSIS — Z955 Presence of coronary angioplasty implant and graft: Secondary | ICD-10-CM | POA: Diagnosis not present

## 2015-08-07 DIAGNOSIS — I25759 Atherosclerosis of native coronary artery of transplanted heart with unspecified angina pectoris: Secondary | ICD-10-CM | POA: Diagnosis not present

## 2015-08-07 DIAGNOSIS — Z941 Heart transplant status: Secondary | ICD-10-CM | POA: Diagnosis not present

## 2015-08-07 DIAGNOSIS — Z79899 Other long term (current) drug therapy: Secondary | ICD-10-CM | POA: Diagnosis not present

## 2015-08-10 ENCOUNTER — Encounter (HOSPITAL_COMMUNITY)
Admission: RE | Admit: 2015-08-10 | Discharge: 2015-08-10 | Disposition: A | Discharge status: Home / Self Care | Payer: Medicare Other | Source: Ambulatory Visit | Attending: Cardiology | Admitting: Cardiology

## 2015-08-10 DIAGNOSIS — I25759 Atherosclerosis of native coronary artery of transplanted heart with unspecified angina pectoris: Secondary | ICD-10-CM | POA: Diagnosis not present

## 2015-08-10 DIAGNOSIS — Z941 Heart transplant status: Secondary | ICD-10-CM | POA: Diagnosis not present

## 2015-08-10 DIAGNOSIS — Z79899 Other long term (current) drug therapy: Secondary | ICD-10-CM | POA: Diagnosis not present

## 2015-08-10 DIAGNOSIS — Z955 Presence of coronary angioplasty implant and graft: Secondary | ICD-10-CM | POA: Diagnosis not present

## 2015-08-10 NOTE — Progress Notes (Signed)
Pt graduated from cardiac rehab program today with completion of 36 exercise sessions in Phase II. Pt maintained good attendance and progressed nicely during his participation in rehab as evidenced by increased MET level.   Medication list reconciled. Repeat  PHQ score- 0 .  Pt has made significant lifestyle changes and should be commended for his success. Pt feels he has achieved his goals during cardiac rehab.   Pt plans to continue exercise at the [facility]. Ed did not attend any of the education classes except the classes with the dietitian.

## 2015-08-12 ENCOUNTER — Encounter (HOSPITAL_COMMUNITY): Payer: Medicare Other

## 2015-08-14 ENCOUNTER — Encounter (HOSPITAL_COMMUNITY): Payer: Medicare Other

## 2015-08-19 DIAGNOSIS — Z87891 Personal history of nicotine dependence: Secondary | ICD-10-CM | POA: Diagnosis not present

## 2015-08-19 DIAGNOSIS — Z4821 Encounter for aftercare following heart transplant: Secondary | ICD-10-CM | POA: Diagnosis not present

## 2015-08-19 DIAGNOSIS — Z48298 Encounter for aftercare following other organ transplant: Secondary | ICD-10-CM | POA: Diagnosis not present

## 2015-08-19 DIAGNOSIS — Z79899 Other long term (current) drug therapy: Secondary | ICD-10-CM | POA: Diagnosis not present

## 2015-08-19 DIAGNOSIS — E119 Type 2 diabetes mellitus without complications: Secondary | ICD-10-CM | POA: Diagnosis not present

## 2015-08-19 DIAGNOSIS — I25759 Atherosclerosis of native coronary artery of transplanted heart with unspecified angina pectoris: Secondary | ICD-10-CM | POA: Diagnosis not present

## 2015-08-19 DIAGNOSIS — Z941 Heart transplant status: Secondary | ICD-10-CM | POA: Diagnosis not present

## 2015-08-19 DIAGNOSIS — I517 Cardiomegaly: Secondary | ICD-10-CM | POA: Diagnosis not present

## 2015-08-19 DIAGNOSIS — Z794 Long term (current) use of insulin: Secondary | ICD-10-CM | POA: Diagnosis not present

## 2015-08-26 DIAGNOSIS — Z6829 Body mass index (BMI) 29.0-29.9, adult: Secondary | ICD-10-CM | POA: Diagnosis not present

## 2015-08-26 DIAGNOSIS — M109 Gout, unspecified: Secondary | ICD-10-CM | POA: Diagnosis not present

## 2015-10-30 DIAGNOSIS — Z7982 Long term (current) use of aspirin: Secondary | ICD-10-CM | POA: Diagnosis not present

## 2015-10-30 DIAGNOSIS — Z950 Presence of cardiac pacemaker: Secondary | ICD-10-CM | POA: Diagnosis not present

## 2015-10-30 DIAGNOSIS — Z79899 Other long term (current) drug therapy: Secondary | ICD-10-CM | POA: Diagnosis not present

## 2015-10-30 DIAGNOSIS — Z45018 Encounter for adjustment and management of other part of cardiac pacemaker: Secondary | ICD-10-CM | POA: Diagnosis not present

## 2015-11-10 DIAGNOSIS — H401112 Primary open-angle glaucoma, right eye, moderate stage: Secondary | ICD-10-CM | POA: Diagnosis not present

## 2015-11-10 DIAGNOSIS — E119 Type 2 diabetes mellitus without complications: Secondary | ICD-10-CM | POA: Diagnosis not present

## 2015-11-10 DIAGNOSIS — H2513 Age-related nuclear cataract, bilateral: Secondary | ICD-10-CM | POA: Diagnosis not present

## 2015-11-10 DIAGNOSIS — H401122 Primary open-angle glaucoma, left eye, moderate stage: Secondary | ICD-10-CM | POA: Diagnosis not present

## 2015-11-19 DIAGNOSIS — I10 Essential (primary) hypertension: Secondary | ICD-10-CM | POA: Diagnosis not present

## 2015-11-19 DIAGNOSIS — Z6829 Body mass index (BMI) 29.0-29.9, adult: Secondary | ICD-10-CM | POA: Diagnosis not present

## 2015-11-19 DIAGNOSIS — Z941 Heart transplant status: Secondary | ICD-10-CM | POA: Diagnosis not present

## 2015-11-19 DIAGNOSIS — E1151 Type 2 diabetes mellitus with diabetic peripheral angiopathy without gangrene: Secondary | ICD-10-CM | POA: Diagnosis not present

## 2015-11-19 DIAGNOSIS — E782 Mixed hyperlipidemia: Secondary | ICD-10-CM | POA: Diagnosis not present

## 2015-11-19 DIAGNOSIS — N183 Chronic kidney disease, stage 3 (moderate): Secondary | ICD-10-CM | POA: Diagnosis not present

## 2015-11-19 DIAGNOSIS — I7389 Other specified peripheral vascular diseases: Secondary | ICD-10-CM | POA: Diagnosis not present

## 2015-12-08 DIAGNOSIS — K4090 Unilateral inguinal hernia, without obstruction or gangrene, not specified as recurrent: Secondary | ICD-10-CM | POA: Diagnosis not present

## 2016-01-16 DIAGNOSIS — Z23 Encounter for immunization: Secondary | ICD-10-CM | POA: Diagnosis not present

## 2016-02-01 DIAGNOSIS — M1A09X0 Idiopathic chronic gout, multiple sites, without tophus (tophi): Secondary | ICD-10-CM | POA: Diagnosis not present

## 2016-02-01 DIAGNOSIS — K4090 Unilateral inguinal hernia, without obstruction or gangrene, not specified as recurrent: Secondary | ICD-10-CM | POA: Diagnosis not present

## 2016-02-01 DIAGNOSIS — Z941 Heart transplant status: Secondary | ICD-10-CM | POA: Diagnosis not present

## 2016-02-01 DIAGNOSIS — I25759 Atherosclerosis of native coronary artery of transplanted heart with unspecified angina pectoris: Secondary | ICD-10-CM | POA: Diagnosis not present

## 2016-02-01 DIAGNOSIS — I639 Cerebral infarction, unspecified: Secondary | ICD-10-CM | POA: Diagnosis not present

## 2016-02-01 DIAGNOSIS — Z950 Presence of cardiac pacemaker: Secondary | ICD-10-CM | POA: Diagnosis not present

## 2016-02-01 DIAGNOSIS — F5104 Psychophysiologic insomnia: Secondary | ICD-10-CM | POA: Diagnosis not present

## 2016-02-01 DIAGNOSIS — Z794 Long term (current) use of insulin: Secondary | ICD-10-CM | POA: Diagnosis not present

## 2016-02-01 DIAGNOSIS — K219 Gastro-esophageal reflux disease without esophagitis: Secondary | ICD-10-CM | POA: Diagnosis not present

## 2016-02-01 DIAGNOSIS — Z713 Dietary counseling and surveillance: Secondary | ICD-10-CM | POA: Diagnosis not present

## 2016-02-01 DIAGNOSIS — Z79899 Other long term (current) drug therapy: Secondary | ICD-10-CM | POA: Diagnosis not present

## 2016-02-01 DIAGNOSIS — B460 Pulmonary mucormycosis: Secondary | ICD-10-CM | POA: Diagnosis not present

## 2016-02-01 DIAGNOSIS — Z01818 Encounter for other preprocedural examination: Secondary | ICD-10-CM | POA: Diagnosis not present

## 2016-02-01 DIAGNOSIS — Z7182 Exercise counseling: Secondary | ICD-10-CM | POA: Diagnosis not present

## 2016-02-01 DIAGNOSIS — E119 Type 2 diabetes mellitus without complications: Secondary | ICD-10-CM | POA: Diagnosis not present

## 2016-02-01 DIAGNOSIS — Z1389 Encounter for screening for other disorder: Secondary | ICD-10-CM | POA: Diagnosis not present

## 2016-02-01 DIAGNOSIS — Z8673 Personal history of transient ischemic attack (TIA), and cerebral infarction without residual deficits: Secondary | ICD-10-CM | POA: Diagnosis not present

## 2016-02-01 DIAGNOSIS — N183 Chronic kidney disease, stage 3 (moderate): Secondary | ICD-10-CM | POA: Diagnosis not present

## 2016-02-04 DIAGNOSIS — L821 Other seborrheic keratosis: Secondary | ICD-10-CM | POA: Diagnosis not present

## 2016-02-04 DIAGNOSIS — D692 Other nonthrombocytopenic purpura: Secondary | ICD-10-CM | POA: Diagnosis not present

## 2016-02-04 DIAGNOSIS — Z85828 Personal history of other malignant neoplasm of skin: Secondary | ICD-10-CM | POA: Diagnosis not present

## 2016-02-04 DIAGNOSIS — L570 Actinic keratosis: Secondary | ICD-10-CM | POA: Diagnosis not present

## 2016-02-04 DIAGNOSIS — D225 Melanocytic nevi of trunk: Secondary | ICD-10-CM | POA: Diagnosis not present

## 2016-02-04 DIAGNOSIS — L814 Other melanin hyperpigmentation: Secondary | ICD-10-CM | POA: Diagnosis not present

## 2016-02-04 DIAGNOSIS — D2262 Melanocytic nevi of left upper limb, including shoulder: Secondary | ICD-10-CM | POA: Diagnosis not present

## 2016-02-04 DIAGNOSIS — D1801 Hemangioma of skin and subcutaneous tissue: Secondary | ICD-10-CM | POA: Diagnosis not present

## 2016-02-05 DIAGNOSIS — Z950 Presence of cardiac pacemaker: Secondary | ICD-10-CM | POA: Diagnosis not present

## 2016-02-05 DIAGNOSIS — Z45018 Encounter for adjustment and management of other part of cardiac pacemaker: Secondary | ICD-10-CM | POA: Diagnosis not present

## 2016-02-18 DIAGNOSIS — Z9889 Other specified postprocedural states: Secondary | ICD-10-CM | POA: Diagnosis not present

## 2016-02-18 DIAGNOSIS — I700 Atherosclerosis of aorta: Secondary | ICD-10-CM | POA: Diagnosis not present

## 2016-02-18 DIAGNOSIS — K4090 Unilateral inguinal hernia, without obstruction or gangrene, not specified as recurrent: Secondary | ICD-10-CM | POA: Diagnosis not present

## 2016-02-18 DIAGNOSIS — Z950 Presence of cardiac pacemaker: Secondary | ICD-10-CM | POA: Diagnosis not present

## 2016-02-18 DIAGNOSIS — Z8673 Personal history of transient ischemic attack (TIA), and cerebral infarction without residual deficits: Secondary | ICD-10-CM | POA: Diagnosis not present

## 2016-02-18 DIAGNOSIS — E119 Type 2 diabetes mellitus without complications: Secondary | ICD-10-CM | POA: Diagnosis not present

## 2016-02-18 DIAGNOSIS — N179 Acute kidney failure, unspecified: Secondary | ICD-10-CM | POA: Diagnosis not present

## 2016-02-18 DIAGNOSIS — Z880 Allergy status to penicillin: Secondary | ICD-10-CM | POA: Diagnosis not present

## 2016-02-18 DIAGNOSIS — Z955 Presence of coronary angioplasty implant and graft: Secondary | ICD-10-CM | POA: Diagnosis not present

## 2016-02-18 DIAGNOSIS — I25759 Atherosclerosis of native coronary artery of transplanted heart with unspecified angina pectoris: Secondary | ICD-10-CM | POA: Diagnosis not present

## 2016-02-18 DIAGNOSIS — Z87891 Personal history of nicotine dependence: Secondary | ICD-10-CM | POA: Diagnosis not present

## 2016-02-18 DIAGNOSIS — E1122 Type 2 diabetes mellitus with diabetic chronic kidney disease: Secondary | ICD-10-CM | POA: Diagnosis not present

## 2016-02-18 DIAGNOSIS — I25119 Atherosclerotic heart disease of native coronary artery with unspecified angina pectoris: Secondary | ICD-10-CM | POA: Diagnosis not present

## 2016-02-18 DIAGNOSIS — T86290 Cardiac allograft vasculopathy: Secondary | ICD-10-CM | POA: Diagnosis not present

## 2016-02-18 DIAGNOSIS — M109 Gout, unspecified: Secondary | ICD-10-CM | POA: Diagnosis not present

## 2016-02-18 DIAGNOSIS — Z125 Encounter for screening for malignant neoplasm of prostate: Secondary | ICD-10-CM | POA: Diagnosis not present

## 2016-02-18 DIAGNOSIS — Z48298 Encounter for aftercare following other organ transplant: Secondary | ICD-10-CM | POA: Diagnosis not present

## 2016-02-18 DIAGNOSIS — N189 Chronic kidney disease, unspecified: Secondary | ICD-10-CM | POA: Diagnosis not present

## 2016-02-18 DIAGNOSIS — Z941 Heart transplant status: Secondary | ICD-10-CM | POA: Diagnosis not present

## 2016-02-18 DIAGNOSIS — Z792 Long term (current) use of antibiotics: Secondary | ICD-10-CM | POA: Diagnosis not present

## 2016-02-18 DIAGNOSIS — R918 Other nonspecific abnormal finding of lung field: Secondary | ICD-10-CM | POA: Diagnosis not present

## 2016-02-18 DIAGNOSIS — E860 Dehydration: Secondary | ICD-10-CM | POA: Diagnosis not present

## 2016-02-18 DIAGNOSIS — Z794 Long term (current) use of insulin: Secondary | ICD-10-CM | POA: Diagnosis not present

## 2016-02-21 DIAGNOSIS — Z87891 Personal history of nicotine dependence: Secondary | ICD-10-CM | POA: Diagnosis not present

## 2016-02-21 DIAGNOSIS — Z950 Presence of cardiac pacemaker: Secondary | ICD-10-CM | POA: Diagnosis not present

## 2016-02-21 DIAGNOSIS — Z8673 Personal history of transient ischemic attack (TIA), and cerebral infarction without residual deficits: Secondary | ICD-10-CM | POA: Diagnosis not present

## 2016-02-21 DIAGNOSIS — I25119 Atherosclerotic heart disease of native coronary artery with unspecified angina pectoris: Secondary | ICD-10-CM | POA: Diagnosis not present

## 2016-02-21 DIAGNOSIS — Z941 Heart transplant status: Secondary | ICD-10-CM | POA: Diagnosis not present

## 2016-02-21 DIAGNOSIS — N179 Acute kidney failure, unspecified: Secondary | ICD-10-CM | POA: Diagnosis not present

## 2016-02-21 DIAGNOSIS — T86290 Cardiac allograft vasculopathy: Secondary | ICD-10-CM | POA: Diagnosis not present

## 2016-02-21 DIAGNOSIS — K4090 Unilateral inguinal hernia, without obstruction or gangrene, not specified as recurrent: Secondary | ICD-10-CM | POA: Diagnosis not present

## 2016-02-21 DIAGNOSIS — E119 Type 2 diabetes mellitus without complications: Secondary | ICD-10-CM | POA: Diagnosis not present

## 2016-02-21 DIAGNOSIS — N289 Disorder of kidney and ureter, unspecified: Secondary | ICD-10-CM | POA: Diagnosis not present

## 2016-02-21 DIAGNOSIS — Z794 Long term (current) use of insulin: Secondary | ICD-10-CM | POA: Diagnosis not present

## 2016-02-22 DIAGNOSIS — E119 Type 2 diabetes mellitus without complications: Secondary | ICD-10-CM | POA: Diagnosis not present

## 2016-02-22 DIAGNOSIS — Z955 Presence of coronary angioplasty implant and graft: Secondary | ICD-10-CM | POA: Diagnosis not present

## 2016-02-22 DIAGNOSIS — N183 Chronic kidney disease, stage 3 (moderate): Secondary | ICD-10-CM | POA: Diagnosis not present

## 2016-02-22 DIAGNOSIS — I25759 Atherosclerosis of native coronary artery of transplanted heart with unspecified angina pectoris: Secondary | ICD-10-CM | POA: Diagnosis not present

## 2016-02-22 DIAGNOSIS — Z941 Heart transplant status: Secondary | ICD-10-CM | POA: Diagnosis not present

## 2016-02-22 DIAGNOSIS — N179 Acute kidney failure, unspecified: Secondary | ICD-10-CM | POA: Diagnosis not present

## 2016-02-22 DIAGNOSIS — N289 Disorder of kidney and ureter, unspecified: Secondary | ICD-10-CM | POA: Diagnosis not present

## 2016-02-22 DIAGNOSIS — T86290 Cardiac allograft vasculopathy: Secondary | ICD-10-CM | POA: Diagnosis not present

## 2016-02-22 DIAGNOSIS — K4090 Unilateral inguinal hernia, without obstruction or gangrene, not specified as recurrent: Secondary | ICD-10-CM | POA: Diagnosis not present

## 2016-02-22 DIAGNOSIS — I25119 Atherosclerotic heart disease of native coronary artery with unspecified angina pectoris: Secondary | ICD-10-CM | POA: Diagnosis not present

## 2016-02-25 DIAGNOSIS — Z941 Heart transplant status: Secondary | ICD-10-CM | POA: Diagnosis not present

## 2016-02-26 DIAGNOSIS — Z7982 Long term (current) use of aspirin: Secondary | ICD-10-CM | POA: Diagnosis not present

## 2016-02-26 DIAGNOSIS — Z950 Presence of cardiac pacemaker: Secondary | ICD-10-CM | POA: Diagnosis not present

## 2016-02-26 DIAGNOSIS — K219 Gastro-esophageal reflux disease without esophagitis: Secondary | ICD-10-CM | POA: Diagnosis not present

## 2016-02-26 DIAGNOSIS — K4090 Unilateral inguinal hernia, without obstruction or gangrene, not specified as recurrent: Secondary | ICD-10-CM | POA: Diagnosis not present

## 2016-02-26 DIAGNOSIS — E1122 Type 2 diabetes mellitus with diabetic chronic kidney disease: Secondary | ICD-10-CM | POA: Diagnosis not present

## 2016-02-26 DIAGNOSIS — D171 Benign lipomatous neoplasm of skin and subcutaneous tissue of trunk: Secondary | ICD-10-CM | POA: Diagnosis not present

## 2016-02-26 DIAGNOSIS — Z87891 Personal history of nicotine dependence: Secondary | ICD-10-CM | POA: Diagnosis not present

## 2016-02-26 DIAGNOSIS — Z941 Heart transplant status: Secondary | ICD-10-CM | POA: Diagnosis not present

## 2016-02-26 DIAGNOSIS — Z794 Long term (current) use of insulin: Secondary | ICD-10-CM | POA: Diagnosis not present

## 2016-02-26 DIAGNOSIS — Z79899 Other long term (current) drug therapy: Secondary | ICD-10-CM | POA: Diagnosis not present

## 2016-02-26 DIAGNOSIS — N189 Chronic kidney disease, unspecified: Secondary | ICD-10-CM | POA: Diagnosis not present

## 2016-02-26 DIAGNOSIS — Z7952 Long term (current) use of systemic steroids: Secondary | ICD-10-CM | POA: Diagnosis not present

## 2016-02-26 DIAGNOSIS — D176 Benign lipomatous neoplasm of spermatic cord: Secondary | ICD-10-CM | POA: Diagnosis not present

## 2016-02-27 DIAGNOSIS — N189 Chronic kidney disease, unspecified: Secondary | ICD-10-CM | POA: Diagnosis not present

## 2016-02-27 DIAGNOSIS — K219 Gastro-esophageal reflux disease without esophagitis: Secondary | ICD-10-CM | POA: Diagnosis not present

## 2016-02-27 DIAGNOSIS — E1122 Type 2 diabetes mellitus with diabetic chronic kidney disease: Secondary | ICD-10-CM | POA: Diagnosis not present

## 2016-02-27 DIAGNOSIS — D176 Benign lipomatous neoplasm of spermatic cord: Secondary | ICD-10-CM | POA: Diagnosis not present

## 2016-02-27 DIAGNOSIS — Z941 Heart transplant status: Secondary | ICD-10-CM | POA: Diagnosis not present

## 2016-02-27 DIAGNOSIS — K4090 Unilateral inguinal hernia, without obstruction or gangrene, not specified as recurrent: Secondary | ICD-10-CM | POA: Diagnosis not present

## 2016-03-02 DIAGNOSIS — Z6829 Body mass index (BMI) 29.0-29.9, adult: Secondary | ICD-10-CM | POA: Diagnosis not present

## 2016-03-02 DIAGNOSIS — B029 Zoster without complications: Secondary | ICD-10-CM | POA: Diagnosis not present

## 2016-03-14 DIAGNOSIS — I10 Essential (primary) hypertension: Secondary | ICD-10-CM | POA: Diagnosis not present

## 2016-03-14 DIAGNOSIS — E782 Mixed hyperlipidemia: Secondary | ICD-10-CM | POA: Diagnosis not present

## 2016-03-14 DIAGNOSIS — E1151 Type 2 diabetes mellitus with diabetic peripheral angiopathy without gangrene: Secondary | ICD-10-CM | POA: Diagnosis not present

## 2016-03-14 DIAGNOSIS — I7389 Other specified peripheral vascular diseases: Secondary | ICD-10-CM | POA: Diagnosis not present

## 2016-03-14 DIAGNOSIS — Z941 Heart transplant status: Secondary | ICD-10-CM | POA: Diagnosis not present

## 2016-03-14 DIAGNOSIS — Z6828 Body mass index (BMI) 28.0-28.9, adult: Secondary | ICD-10-CM | POA: Diagnosis not present

## 2016-03-16 DIAGNOSIS — H401112 Primary open-angle glaucoma, right eye, moderate stage: Secondary | ICD-10-CM | POA: Diagnosis not present

## 2016-03-16 DIAGNOSIS — H43812 Vitreous degeneration, left eye: Secondary | ICD-10-CM | POA: Diagnosis not present

## 2016-03-16 DIAGNOSIS — H401122 Primary open-angle glaucoma, left eye, moderate stage: Secondary | ICD-10-CM | POA: Diagnosis not present

## 2016-03-31 DIAGNOSIS — K4090 Unilateral inguinal hernia, without obstruction or gangrene, not specified as recurrent: Secondary | ICD-10-CM | POA: Diagnosis not present

## 2016-03-31 DIAGNOSIS — Z4889 Encounter for other specified surgical aftercare: Secondary | ICD-10-CM | POA: Diagnosis not present

## 2016-05-13 DIAGNOSIS — Z950 Presence of cardiac pacemaker: Secondary | ICD-10-CM | POA: Diagnosis not present

## 2016-05-13 DIAGNOSIS — Z45018 Encounter for adjustment and management of other part of cardiac pacemaker: Secondary | ICD-10-CM | POA: Diagnosis not present

## 2016-07-12 DIAGNOSIS — H401132 Primary open-angle glaucoma, bilateral, moderate stage: Secondary | ICD-10-CM | POA: Diagnosis not present

## 2016-07-26 DIAGNOSIS — Z941 Heart transplant status: Secondary | ICD-10-CM | POA: Diagnosis not present

## 2016-07-26 DIAGNOSIS — Z1389 Encounter for screening for other disorder: Secondary | ICD-10-CM | POA: Diagnosis not present

## 2016-07-26 DIAGNOSIS — I10 Essential (primary) hypertension: Secondary | ICD-10-CM | POA: Diagnosis not present

## 2016-07-26 DIAGNOSIS — Z6829 Body mass index (BMI) 29.0-29.9, adult: Secondary | ICD-10-CM | POA: Diagnosis not present

## 2016-07-26 DIAGNOSIS — N183 Chronic kidney disease, stage 3 (moderate): Secondary | ICD-10-CM | POA: Diagnosis not present

## 2016-07-26 DIAGNOSIS — E1151 Type 2 diabetes mellitus with diabetic peripheral angiopathy without gangrene: Secondary | ICD-10-CM | POA: Diagnosis not present

## 2016-07-26 DIAGNOSIS — I7389 Other specified peripheral vascular diseases: Secondary | ICD-10-CM | POA: Diagnosis not present

## 2016-08-04 DIAGNOSIS — D1801 Hemangioma of skin and subcutaneous tissue: Secondary | ICD-10-CM | POA: Diagnosis not present

## 2016-08-04 DIAGNOSIS — D692 Other nonthrombocytopenic purpura: Secondary | ICD-10-CM | POA: Diagnosis not present

## 2016-08-04 DIAGNOSIS — L814 Other melanin hyperpigmentation: Secondary | ICD-10-CM | POA: Diagnosis not present

## 2016-08-04 DIAGNOSIS — L821 Other seborrheic keratosis: Secondary | ICD-10-CM | POA: Diagnosis not present

## 2016-08-04 DIAGNOSIS — D225 Melanocytic nevi of trunk: Secondary | ICD-10-CM | POA: Diagnosis not present

## 2016-08-04 DIAGNOSIS — L570 Actinic keratosis: Secondary | ICD-10-CM | POA: Diagnosis not present

## 2016-08-04 DIAGNOSIS — Z85828 Personal history of other malignant neoplasm of skin: Secondary | ICD-10-CM | POA: Diagnosis not present

## 2016-08-17 DIAGNOSIS — Z950 Presence of cardiac pacemaker: Secondary | ICD-10-CM | POA: Diagnosis not present

## 2016-08-25 DIAGNOSIS — Z5181 Encounter for therapeutic drug level monitoring: Secondary | ICD-10-CM | POA: Diagnosis not present

## 2016-08-25 DIAGNOSIS — I517 Cardiomegaly: Secondary | ICD-10-CM | POA: Diagnosis not present

## 2016-08-25 DIAGNOSIS — T86290 Cardiac allograft vasculopathy: Secondary | ICD-10-CM | POA: Diagnosis not present

## 2016-08-25 DIAGNOSIS — Z941 Heart transplant status: Secondary | ICD-10-CM | POA: Diagnosis not present

## 2016-08-25 DIAGNOSIS — Z7902 Long term (current) use of antithrombotics/antiplatelets: Secondary | ICD-10-CM | POA: Diagnosis not present

## 2016-08-25 DIAGNOSIS — Z48298 Encounter for aftercare following other organ transplant: Secondary | ICD-10-CM | POA: Diagnosis not present

## 2016-11-15 DIAGNOSIS — Z941 Heart transplant status: Secondary | ICD-10-CM | POA: Diagnosis not present

## 2016-11-15 DIAGNOSIS — N183 Chronic kidney disease, stage 3 (moderate): Secondary | ICD-10-CM | POA: Diagnosis not present

## 2016-11-15 DIAGNOSIS — I10 Essential (primary) hypertension: Secondary | ICD-10-CM | POA: Diagnosis not present

## 2016-11-15 DIAGNOSIS — E782 Mixed hyperlipidemia: Secondary | ICD-10-CM | POA: Diagnosis not present

## 2016-11-15 DIAGNOSIS — G2581 Restless legs syndrome: Secondary | ICD-10-CM | POA: Diagnosis not present

## 2016-11-15 DIAGNOSIS — Z6829 Body mass index (BMI) 29.0-29.9, adult: Secondary | ICD-10-CM | POA: Diagnosis not present

## 2016-11-15 DIAGNOSIS — E1151 Type 2 diabetes mellitus with diabetic peripheral angiopathy without gangrene: Secondary | ICD-10-CM | POA: Diagnosis not present

## 2016-11-15 DIAGNOSIS — G4709 Other insomnia: Secondary | ICD-10-CM | POA: Diagnosis not present

## 2016-11-15 DIAGNOSIS — I7389 Other specified peripheral vascular diseases: Secondary | ICD-10-CM | POA: Diagnosis not present

## 2016-11-21 DIAGNOSIS — H401132 Primary open-angle glaucoma, bilateral, moderate stage: Secondary | ICD-10-CM | POA: Diagnosis not present

## 2016-11-21 DIAGNOSIS — H2513 Age-related nuclear cataract, bilateral: Secondary | ICD-10-CM | POA: Diagnosis not present

## 2016-11-21 DIAGNOSIS — H524 Presbyopia: Secondary | ICD-10-CM | POA: Diagnosis not present

## 2016-11-21 DIAGNOSIS — E119 Type 2 diabetes mellitus without complications: Secondary | ICD-10-CM | POA: Diagnosis not present

## 2017-02-03 DIAGNOSIS — D0472 Carcinoma in situ of skin of left lower limb, including hip: Secondary | ICD-10-CM | POA: Diagnosis not present

## 2017-02-03 DIAGNOSIS — L821 Other seborrheic keratosis: Secondary | ICD-10-CM | POA: Diagnosis not present

## 2017-02-03 DIAGNOSIS — L570 Actinic keratosis: Secondary | ICD-10-CM | POA: Diagnosis not present

## 2017-02-03 DIAGNOSIS — Z85828 Personal history of other malignant neoplasm of skin: Secondary | ICD-10-CM | POA: Diagnosis not present

## 2017-02-11 DIAGNOSIS — Z23 Encounter for immunization: Secondary | ICD-10-CM | POA: Diagnosis not present

## 2017-02-23 DIAGNOSIS — Z85828 Personal history of other malignant neoplasm of skin: Secondary | ICD-10-CM | POA: Diagnosis not present

## 2017-02-23 DIAGNOSIS — D0472 Carcinoma in situ of skin of left lower limb, including hip: Secondary | ICD-10-CM | POA: Diagnosis not present

## 2017-03-03 DIAGNOSIS — Z950 Presence of cardiac pacemaker: Secondary | ICD-10-CM | POA: Diagnosis not present

## 2017-03-03 DIAGNOSIS — Z45018 Encounter for adjustment and management of other part of cardiac pacemaker: Secondary | ICD-10-CM | POA: Diagnosis not present

## 2017-03-06 DIAGNOSIS — L0889 Other specified local infections of the skin and subcutaneous tissue: Secondary | ICD-10-CM | POA: Diagnosis not present

## 2017-03-07 DIAGNOSIS — I10 Essential (primary) hypertension: Secondary | ICD-10-CM | POA: Diagnosis not present

## 2017-03-07 DIAGNOSIS — Z941 Heart transplant status: Secondary | ICD-10-CM | POA: Diagnosis not present

## 2017-03-07 DIAGNOSIS — E1151 Type 2 diabetes mellitus with diabetic peripheral angiopathy without gangrene: Secondary | ICD-10-CM | POA: Diagnosis not present

## 2017-03-07 DIAGNOSIS — I7389 Other specified peripheral vascular diseases: Secondary | ICD-10-CM | POA: Diagnosis not present

## 2017-03-07 DIAGNOSIS — Z6829 Body mass index (BMI) 29.0-29.9, adult: Secondary | ICD-10-CM | POA: Diagnosis not present

## 2017-03-07 DIAGNOSIS — E782 Mixed hyperlipidemia: Secondary | ICD-10-CM | POA: Diagnosis not present

## 2017-04-04 DIAGNOSIS — J189 Pneumonia, unspecified organism: Secondary | ICD-10-CM | POA: Diagnosis not present

## 2017-04-04 DIAGNOSIS — R05 Cough: Secondary | ICD-10-CM | POA: Diagnosis not present

## 2017-05-08 DIAGNOSIS — H401132 Primary open-angle glaucoma, bilateral, moderate stage: Secondary | ICD-10-CM | POA: Diagnosis not present

## 2017-05-23 DIAGNOSIS — I2510 Atherosclerotic heart disease of native coronary artery without angina pectoris: Secondary | ICD-10-CM | POA: Diagnosis not present

## 2017-05-23 DIAGNOSIS — Z131 Encounter for screening for diabetes mellitus: Secondary | ICD-10-CM | POA: Diagnosis not present

## 2017-05-23 DIAGNOSIS — I517 Cardiomegaly: Secondary | ICD-10-CM | POA: Diagnosis not present

## 2017-05-23 DIAGNOSIS — Z794 Long term (current) use of insulin: Secondary | ICD-10-CM | POA: Diagnosis not present

## 2017-05-23 DIAGNOSIS — Z4821 Encounter for aftercare following heart transplant: Secondary | ICD-10-CM | POA: Diagnosis not present

## 2017-05-23 DIAGNOSIS — Z950 Presence of cardiac pacemaker: Secondary | ICD-10-CM | POA: Diagnosis not present

## 2017-05-23 DIAGNOSIS — Z48298 Encounter for aftercare following other organ transplant: Secondary | ICD-10-CM | POA: Diagnosis not present

## 2017-05-23 DIAGNOSIS — E119 Type 2 diabetes mellitus without complications: Secondary | ICD-10-CM | POA: Diagnosis not present

## 2017-05-23 DIAGNOSIS — Z125 Encounter for screening for malignant neoplasm of prostate: Secondary | ICD-10-CM | POA: Diagnosis not present

## 2017-05-23 DIAGNOSIS — Z7902 Long term (current) use of antithrombotics/antiplatelets: Secondary | ICD-10-CM | POA: Diagnosis not present

## 2017-05-23 DIAGNOSIS — I25759 Atherosclerosis of native coronary artery of transplanted heart with unspecified angina pectoris: Secondary | ICD-10-CM | POA: Diagnosis not present

## 2017-05-23 DIAGNOSIS — Z13220 Encounter for screening for lipoid disorders: Secondary | ICD-10-CM | POA: Diagnosis not present

## 2017-05-23 DIAGNOSIS — Z79899 Other long term (current) drug therapy: Secondary | ICD-10-CM | POA: Diagnosis not present

## 2017-05-23 DIAGNOSIS — Z941 Heart transplant status: Secondary | ICD-10-CM | POA: Diagnosis not present

## 2017-05-23 DIAGNOSIS — Z955 Presence of coronary angioplasty implant and graft: Secondary | ICD-10-CM | POA: Diagnosis not present

## 2017-05-30 DIAGNOSIS — Z48298 Encounter for aftercare following other organ transplant: Secondary | ICD-10-CM | POA: Diagnosis not present

## 2017-05-30 DIAGNOSIS — Z941 Heart transplant status: Secondary | ICD-10-CM | POA: Diagnosis not present

## 2017-06-09 DIAGNOSIS — Z125 Encounter for screening for malignant neoplasm of prostate: Secondary | ICD-10-CM | POA: Diagnosis not present

## 2017-06-09 DIAGNOSIS — E1151 Type 2 diabetes mellitus with diabetic peripheral angiopathy without gangrene: Secondary | ICD-10-CM | POA: Diagnosis not present

## 2017-06-09 DIAGNOSIS — Z950 Presence of cardiac pacemaker: Secondary | ICD-10-CM | POA: Diagnosis not present

## 2017-06-09 DIAGNOSIS — M109 Gout, unspecified: Secondary | ICD-10-CM | POA: Diagnosis not present

## 2017-06-09 DIAGNOSIS — Z45018 Encounter for adjustment and management of other part of cardiac pacemaker: Secondary | ICD-10-CM | POA: Diagnosis not present

## 2017-06-09 DIAGNOSIS — N183 Chronic kidney disease, stage 3 (moderate): Secondary | ICD-10-CM | POA: Diagnosis not present

## 2017-06-09 DIAGNOSIS — R82998 Other abnormal findings in urine: Secondary | ICD-10-CM | POA: Diagnosis not present

## 2017-06-09 DIAGNOSIS — E782 Mixed hyperlipidemia: Secondary | ICD-10-CM | POA: Diagnosis not present

## 2017-06-15 DIAGNOSIS — Z1212 Encounter for screening for malignant neoplasm of rectum: Secondary | ICD-10-CM | POA: Diagnosis not present

## 2017-06-16 DIAGNOSIS — I10 Essential (primary) hypertension: Secondary | ICD-10-CM | POA: Diagnosis not present

## 2017-06-16 DIAGNOSIS — M109 Gout, unspecified: Secondary | ICD-10-CM | POA: Diagnosis not present

## 2017-06-16 DIAGNOSIS — Z6829 Body mass index (BMI) 29.0-29.9, adult: Secondary | ICD-10-CM | POA: Diagnosis not present

## 2017-06-16 DIAGNOSIS — E1151 Type 2 diabetes mellitus with diabetic peripheral angiopathy without gangrene: Secondary | ICD-10-CM | POA: Diagnosis not present

## 2017-06-16 DIAGNOSIS — J189 Pneumonia, unspecified organism: Secondary | ICD-10-CM | POA: Diagnosis not present

## 2017-06-16 DIAGNOSIS — Z794 Long term (current) use of insulin: Secondary | ICD-10-CM | POA: Diagnosis not present

## 2017-06-16 DIAGNOSIS — Z0000 Encounter for general adult medical examination without abnormal findings: Secondary | ICD-10-CM | POA: Diagnosis not present

## 2017-06-16 DIAGNOSIS — E782 Mixed hyperlipidemia: Secondary | ICD-10-CM | POA: Diagnosis not present

## 2017-06-16 DIAGNOSIS — N183 Chronic kidney disease, stage 3 (moderate): Secondary | ICD-10-CM | POA: Diagnosis not present

## 2017-06-16 DIAGNOSIS — Z941 Heart transplant status: Secondary | ICD-10-CM | POA: Diagnosis not present

## 2017-06-16 DIAGNOSIS — I7389 Other specified peripheral vascular diseases: Secondary | ICD-10-CM | POA: Diagnosis not present

## 2017-06-16 DIAGNOSIS — Z1389 Encounter for screening for other disorder: Secondary | ICD-10-CM | POA: Diagnosis not present

## 2017-06-23 DIAGNOSIS — C44329 Squamous cell carcinoma of skin of other parts of face: Secondary | ICD-10-CM | POA: Diagnosis not present

## 2017-06-23 DIAGNOSIS — D485 Neoplasm of uncertain behavior of skin: Secondary | ICD-10-CM | POA: Diagnosis not present

## 2017-06-23 DIAGNOSIS — Z85828 Personal history of other malignant neoplasm of skin: Secondary | ICD-10-CM | POA: Diagnosis not present

## 2017-07-13 DIAGNOSIS — Z85828 Personal history of other malignant neoplasm of skin: Secondary | ICD-10-CM | POA: Diagnosis not present

## 2017-07-13 DIAGNOSIS — C44329 Squamous cell carcinoma of skin of other parts of face: Secondary | ICD-10-CM | POA: Diagnosis not present

## 2017-10-02 DIAGNOSIS — Z794 Long term (current) use of insulin: Secondary | ICD-10-CM | POA: Diagnosis not present

## 2017-10-02 DIAGNOSIS — G4709 Other insomnia: Secondary | ICD-10-CM | POA: Diagnosis not present

## 2017-10-02 DIAGNOSIS — N183 Chronic kidney disease, stage 3 (moderate): Secondary | ICD-10-CM | POA: Diagnosis not present

## 2017-10-02 DIAGNOSIS — Z6830 Body mass index (BMI) 30.0-30.9, adult: Secondary | ICD-10-CM | POA: Diagnosis not present

## 2017-10-02 DIAGNOSIS — Z941 Heart transplant status: Secondary | ICD-10-CM | POA: Diagnosis not present

## 2017-10-02 DIAGNOSIS — E782 Mixed hyperlipidemia: Secondary | ICD-10-CM | POA: Diagnosis not present

## 2017-10-02 DIAGNOSIS — I7389 Other specified peripheral vascular diseases: Secondary | ICD-10-CM | POA: Diagnosis not present

## 2017-10-02 DIAGNOSIS — I10 Essential (primary) hypertension: Secondary | ICD-10-CM | POA: Diagnosis not present

## 2017-10-02 DIAGNOSIS — M109 Gout, unspecified: Secondary | ICD-10-CM | POA: Diagnosis not present

## 2017-10-02 DIAGNOSIS — E1151 Type 2 diabetes mellitus with diabetic peripheral angiopathy without gangrene: Secondary | ICD-10-CM | POA: Diagnosis not present

## 2017-10-10 DIAGNOSIS — Z950 Presence of cardiac pacemaker: Secondary | ICD-10-CM | POA: Diagnosis not present

## 2017-10-23 DIAGNOSIS — H2513 Age-related nuclear cataract, bilateral: Secondary | ICD-10-CM | POA: Diagnosis not present

## 2017-10-23 DIAGNOSIS — E119 Type 2 diabetes mellitus without complications: Secondary | ICD-10-CM | POA: Diagnosis not present

## 2017-10-23 DIAGNOSIS — H401132 Primary open-angle glaucoma, bilateral, moderate stage: Secondary | ICD-10-CM | POA: Diagnosis not present

## 2017-10-23 DIAGNOSIS — H52203 Unspecified astigmatism, bilateral: Secondary | ICD-10-CM | POA: Diagnosis not present

## 2017-11-14 DIAGNOSIS — Z941 Heart transplant status: Secondary | ICD-10-CM | POA: Diagnosis not present

## 2017-11-14 DIAGNOSIS — Z7982 Long term (current) use of aspirin: Secondary | ICD-10-CM | POA: Diagnosis not present

## 2017-11-14 DIAGNOSIS — R0609 Other forms of dyspnea: Secondary | ICD-10-CM | POA: Diagnosis not present

## 2017-11-14 DIAGNOSIS — T86290 Cardiac allograft vasculopathy: Secondary | ICD-10-CM | POA: Diagnosis not present

## 2017-11-14 DIAGNOSIS — Z5181 Encounter for therapeutic drug level monitoring: Secondary | ICD-10-CM | POA: Diagnosis not present

## 2017-11-14 DIAGNOSIS — Z48298 Encounter for aftercare following other organ transplant: Secondary | ICD-10-CM | POA: Diagnosis not present

## 2017-11-14 DIAGNOSIS — Z79899 Other long term (current) drug therapy: Secondary | ICD-10-CM | POA: Diagnosis not present

## 2017-11-14 DIAGNOSIS — Z7902 Long term (current) use of antithrombotics/antiplatelets: Secondary | ICD-10-CM | POA: Diagnosis not present

## 2017-11-23 DIAGNOSIS — H25811 Combined forms of age-related cataract, right eye: Secondary | ICD-10-CM | POA: Diagnosis not present

## 2017-11-23 DIAGNOSIS — H2511 Age-related nuclear cataract, right eye: Secondary | ICD-10-CM | POA: Diagnosis not present

## 2017-11-23 DIAGNOSIS — H25011 Cortical age-related cataract, right eye: Secondary | ICD-10-CM | POA: Diagnosis not present

## 2017-11-23 DIAGNOSIS — H25041 Posterior subcapsular polar age-related cataract, right eye: Secondary | ICD-10-CM | POA: Diagnosis not present

## 2017-12-28 DIAGNOSIS — H25012 Cortical age-related cataract, left eye: Secondary | ICD-10-CM | POA: Diagnosis not present

## 2017-12-28 DIAGNOSIS — H2512 Age-related nuclear cataract, left eye: Secondary | ICD-10-CM | POA: Diagnosis not present

## 2017-12-28 DIAGNOSIS — H25812 Combined forms of age-related cataract, left eye: Secondary | ICD-10-CM | POA: Diagnosis not present

## 2018-01-06 DIAGNOSIS — Z23 Encounter for immunization: Secondary | ICD-10-CM | POA: Diagnosis not present

## 2018-01-09 DIAGNOSIS — I472 Ventricular tachycardia: Secondary | ICD-10-CM | POA: Diagnosis not present

## 2018-01-09 DIAGNOSIS — Z45018 Encounter for adjustment and management of other part of cardiac pacemaker: Secondary | ICD-10-CM | POA: Diagnosis not present

## 2018-01-09 DIAGNOSIS — Z950 Presence of cardiac pacemaker: Secondary | ICD-10-CM | POA: Diagnosis not present

## 2018-01-24 DIAGNOSIS — L720 Epidermal cyst: Secondary | ICD-10-CM | POA: Diagnosis not present

## 2018-01-24 DIAGNOSIS — D1801 Hemangioma of skin and subcutaneous tissue: Secondary | ICD-10-CM | POA: Diagnosis not present

## 2018-01-24 DIAGNOSIS — D692 Other nonthrombocytopenic purpura: Secondary | ICD-10-CM | POA: Diagnosis not present

## 2018-01-24 DIAGNOSIS — L821 Other seborrheic keratosis: Secondary | ICD-10-CM | POA: Diagnosis not present

## 2018-01-24 DIAGNOSIS — D485 Neoplasm of uncertain behavior of skin: Secondary | ICD-10-CM | POA: Diagnosis not present

## 2018-01-24 DIAGNOSIS — D2271 Melanocytic nevi of right lower limb, including hip: Secondary | ICD-10-CM | POA: Diagnosis not present

## 2018-01-24 DIAGNOSIS — D225 Melanocytic nevi of trunk: Secondary | ICD-10-CM | POA: Diagnosis not present

## 2018-01-24 DIAGNOSIS — Z85828 Personal history of other malignant neoplasm of skin: Secondary | ICD-10-CM | POA: Diagnosis not present

## 2018-02-14 DIAGNOSIS — Z85828 Personal history of other malignant neoplasm of skin: Secondary | ICD-10-CM | POA: Diagnosis not present

## 2018-02-14 DIAGNOSIS — D045 Carcinoma in situ of skin of trunk: Secondary | ICD-10-CM | POA: Diagnosis not present

## 2018-02-14 DIAGNOSIS — R21 Rash and other nonspecific skin eruption: Secondary | ICD-10-CM | POA: Diagnosis not present

## 2018-02-14 DIAGNOSIS — D0462 Carcinoma in situ of skin of left upper limb, including shoulder: Secondary | ICD-10-CM | POA: Diagnosis not present

## 2018-02-14 DIAGNOSIS — D0461 Carcinoma in situ of skin of right upper limb, including shoulder: Secondary | ICD-10-CM | POA: Diagnosis not present

## 2018-02-14 DIAGNOSIS — C44529 Squamous cell carcinoma of skin of other part of trunk: Secondary | ICD-10-CM | POA: Diagnosis not present

## 2018-02-14 DIAGNOSIS — C44629 Squamous cell carcinoma of skin of left upper limb, including shoulder: Secondary | ICD-10-CM | POA: Diagnosis not present

## 2018-02-14 DIAGNOSIS — D044 Carcinoma in situ of skin of scalp and neck: Secondary | ICD-10-CM | POA: Diagnosis not present

## 2018-02-14 DIAGNOSIS — C44519 Basal cell carcinoma of skin of other part of trunk: Secondary | ICD-10-CM | POA: Diagnosis not present

## 2018-02-14 DIAGNOSIS — D485 Neoplasm of uncertain behavior of skin: Secondary | ICD-10-CM | POA: Diagnosis not present

## 2018-03-08 DIAGNOSIS — D045 Carcinoma in situ of skin of trunk: Secondary | ICD-10-CM | POA: Diagnosis not present

## 2018-03-08 DIAGNOSIS — D0462 Carcinoma in situ of skin of left upper limb, including shoulder: Secondary | ICD-10-CM | POA: Diagnosis not present

## 2018-03-08 DIAGNOSIS — D044 Carcinoma in situ of skin of scalp and neck: Secondary | ICD-10-CM | POA: Diagnosis not present

## 2018-03-08 DIAGNOSIS — Z85828 Personal history of other malignant neoplasm of skin: Secondary | ICD-10-CM | POA: Diagnosis not present

## 2018-03-08 DIAGNOSIS — C44629 Squamous cell carcinoma of skin of left upper limb, including shoulder: Secondary | ICD-10-CM | POA: Diagnosis not present

## 2018-03-08 DIAGNOSIS — L570 Actinic keratosis: Secondary | ICD-10-CM | POA: Diagnosis not present

## 2018-04-10 DIAGNOSIS — Z950 Presence of cardiac pacemaker: Secondary | ICD-10-CM | POA: Diagnosis not present

## 2018-05-03 DIAGNOSIS — Z941 Heart transplant status: Secondary | ICD-10-CM | POA: Diagnosis not present

## 2018-05-03 DIAGNOSIS — J90 Pleural effusion, not elsewhere classified: Secondary | ICD-10-CM | POA: Diagnosis not present

## 2018-05-03 DIAGNOSIS — Z5181 Encounter for therapeutic drug level monitoring: Secondary | ICD-10-CM | POA: Diagnosis not present

## 2018-05-03 DIAGNOSIS — Z13220 Encounter for screening for lipoid disorders: Secondary | ICD-10-CM | POA: Diagnosis not present

## 2018-05-03 DIAGNOSIS — Z125 Encounter for screening for malignant neoplasm of prostate: Secondary | ICD-10-CM | POA: Diagnosis not present

## 2018-05-03 DIAGNOSIS — Z9889 Other specified postprocedural states: Secondary | ICD-10-CM | POA: Diagnosis not present

## 2018-05-03 DIAGNOSIS — J9811 Atelectasis: Secondary | ICD-10-CM | POA: Diagnosis not present

## 2018-05-03 DIAGNOSIS — I10 Essential (primary) hypertension: Secondary | ICD-10-CM | POA: Diagnosis not present

## 2018-05-03 DIAGNOSIS — I25759 Atherosclerosis of native coronary artery of transplanted heart with unspecified angina pectoris: Secondary | ICD-10-CM | POA: Diagnosis not present

## 2018-05-03 DIAGNOSIS — Z79899 Other long term (current) drug therapy: Secondary | ICD-10-CM | POA: Diagnosis not present

## 2018-05-03 DIAGNOSIS — T86290 Cardiac allograft vasculopathy: Secondary | ICD-10-CM | POA: Diagnosis not present

## 2018-05-23 DIAGNOSIS — H401132 Primary open-angle glaucoma, bilateral, moderate stage: Secondary | ICD-10-CM | POA: Diagnosis not present

## 2018-05-24 DIAGNOSIS — I472 Ventricular tachycardia: Secondary | ICD-10-CM | POA: Diagnosis not present

## 2018-05-24 DIAGNOSIS — Z941 Heart transplant status: Secondary | ICD-10-CM | POA: Diagnosis not present

## 2018-05-24 DIAGNOSIS — I878 Other specified disorders of veins: Secondary | ICD-10-CM | POA: Diagnosis not present

## 2018-05-24 DIAGNOSIS — I25811 Atherosclerosis of native coronary artery of transplanted heart without angina pectoris: Secondary | ICD-10-CM | POA: Diagnosis not present

## 2018-05-24 DIAGNOSIS — Z4821 Encounter for aftercare following heart transplant: Secondary | ICD-10-CM | POA: Diagnosis not present

## 2018-06-11 DIAGNOSIS — E1151 Type 2 diabetes mellitus with diabetic peripheral angiopathy without gangrene: Secondary | ICD-10-CM | POA: Diagnosis not present

## 2018-06-11 DIAGNOSIS — R82998 Other abnormal findings in urine: Secondary | ICD-10-CM | POA: Diagnosis not present

## 2018-06-11 DIAGNOSIS — E782 Mixed hyperlipidemia: Secondary | ICD-10-CM | POA: Diagnosis not present

## 2018-06-11 DIAGNOSIS — N183 Chronic kidney disease, stage 3 (moderate): Secondary | ICD-10-CM | POA: Diagnosis not present

## 2018-06-11 DIAGNOSIS — M109 Gout, unspecified: Secondary | ICD-10-CM | POA: Diagnosis not present

## 2018-06-11 DIAGNOSIS — Z125 Encounter for screening for malignant neoplasm of prostate: Secondary | ICD-10-CM | POA: Diagnosis not present

## 2018-06-13 DIAGNOSIS — Z1212 Encounter for screening for malignant neoplasm of rectum: Secondary | ICD-10-CM | POA: Diagnosis not present

## 2018-06-18 DIAGNOSIS — I10 Essential (primary) hypertension: Secondary | ICD-10-CM | POA: Diagnosis not present

## 2018-06-18 DIAGNOSIS — Z941 Heart transplant status: Secondary | ICD-10-CM | POA: Diagnosis not present

## 2018-06-18 DIAGNOSIS — D8989 Other specified disorders involving the immune mechanism, not elsewhere classified: Secondary | ICD-10-CM | POA: Diagnosis not present

## 2018-06-18 DIAGNOSIS — M545 Low back pain: Secondary | ICD-10-CM | POA: Diagnosis not present

## 2018-06-18 DIAGNOSIS — E1151 Type 2 diabetes mellitus with diabetic peripheral angiopathy without gangrene: Secondary | ICD-10-CM | POA: Diagnosis not present

## 2018-06-18 DIAGNOSIS — N183 Chronic kidney disease, stage 3 (moderate): Secondary | ICD-10-CM | POA: Diagnosis not present

## 2018-06-18 DIAGNOSIS — Z0000 Encounter for general adult medical examination without abnormal findings: Secondary | ICD-10-CM | POA: Diagnosis not present

## 2018-06-18 DIAGNOSIS — Z1331 Encounter for screening for depression: Secondary | ICD-10-CM | POA: Diagnosis not present

## 2018-06-18 DIAGNOSIS — Z794 Long term (current) use of insulin: Secondary | ICD-10-CM | POA: Diagnosis not present

## 2018-06-18 DIAGNOSIS — E782 Mixed hyperlipidemia: Secondary | ICD-10-CM | POA: Diagnosis not present

## 2018-06-18 DIAGNOSIS — I739 Peripheral vascular disease, unspecified: Secondary | ICD-10-CM | POA: Diagnosis not present

## 2018-06-18 DIAGNOSIS — Z1339 Encounter for screening examination for other mental health and behavioral disorders: Secondary | ICD-10-CM | POA: Diagnosis not present

## 2018-07-10 DIAGNOSIS — Z950 Presence of cardiac pacemaker: Secondary | ICD-10-CM | POA: Diagnosis not present

## 2018-07-10 DIAGNOSIS — Z45018 Encounter for adjustment and management of other part of cardiac pacemaker: Secondary | ICD-10-CM | POA: Diagnosis not present

## 2018-07-11 DIAGNOSIS — I10 Essential (primary) hypertension: Secondary | ICD-10-CM | POA: Diagnosis not present

## 2018-07-11 DIAGNOSIS — E1151 Type 2 diabetes mellitus with diabetic peripheral angiopathy without gangrene: Secondary | ICD-10-CM | POA: Diagnosis not present

## 2018-07-11 DIAGNOSIS — Z794 Long term (current) use of insulin: Secondary | ICD-10-CM | POA: Diagnosis not present

## 2018-07-11 DIAGNOSIS — N183 Chronic kidney disease, stage 3 (moderate): Secondary | ICD-10-CM | POA: Diagnosis not present

## 2018-07-12 DIAGNOSIS — M2560 Stiffness of unspecified joint, not elsewhere classified: Secondary | ICD-10-CM | POA: Diagnosis not present

## 2018-07-12 DIAGNOSIS — M5416 Radiculopathy, lumbar region: Secondary | ICD-10-CM | POA: Diagnosis not present

## 2018-07-12 DIAGNOSIS — M629 Disorder of muscle, unspecified: Secondary | ICD-10-CM | POA: Diagnosis not present

## 2018-07-12 DIAGNOSIS — M545 Low back pain: Secondary | ICD-10-CM | POA: Diagnosis not present

## 2018-07-16 DIAGNOSIS — M545 Low back pain: Secondary | ICD-10-CM | POA: Diagnosis not present

## 2018-07-16 DIAGNOSIS — M2560 Stiffness of unspecified joint, not elsewhere classified: Secondary | ICD-10-CM | POA: Diagnosis not present

## 2018-07-16 DIAGNOSIS — M629 Disorder of muscle, unspecified: Secondary | ICD-10-CM | POA: Diagnosis not present

## 2018-07-16 DIAGNOSIS — M5416 Radiculopathy, lumbar region: Secondary | ICD-10-CM | POA: Diagnosis not present

## 2018-07-18 DIAGNOSIS — M5416 Radiculopathy, lumbar region: Secondary | ICD-10-CM | POA: Diagnosis not present

## 2018-07-18 DIAGNOSIS — M545 Low back pain: Secondary | ICD-10-CM | POA: Diagnosis not present

## 2018-07-18 DIAGNOSIS — M629 Disorder of muscle, unspecified: Secondary | ICD-10-CM | POA: Diagnosis not present

## 2018-07-18 DIAGNOSIS — M2560 Stiffness of unspecified joint, not elsewhere classified: Secondary | ICD-10-CM | POA: Diagnosis not present

## 2018-10-03 DIAGNOSIS — L570 Actinic keratosis: Secondary | ICD-10-CM | POA: Diagnosis not present

## 2018-10-03 DIAGNOSIS — C44629 Squamous cell carcinoma of skin of left upper limb, including shoulder: Secondary | ICD-10-CM | POA: Diagnosis not present

## 2018-10-03 DIAGNOSIS — D0472 Carcinoma in situ of skin of left lower limb, including hip: Secondary | ICD-10-CM | POA: Diagnosis not present

## 2018-10-03 DIAGNOSIS — D485 Neoplasm of uncertain behavior of skin: Secondary | ICD-10-CM | POA: Diagnosis not present

## 2018-10-03 DIAGNOSIS — L814 Other melanin hyperpigmentation: Secondary | ICD-10-CM | POA: Diagnosis not present

## 2018-10-03 DIAGNOSIS — D692 Other nonthrombocytopenic purpura: Secondary | ICD-10-CM | POA: Diagnosis not present

## 2018-10-03 DIAGNOSIS — Z85828 Personal history of other malignant neoplasm of skin: Secondary | ICD-10-CM | POA: Diagnosis not present

## 2018-10-03 DIAGNOSIS — L723 Sebaceous cyst: Secondary | ICD-10-CM | POA: Diagnosis not present

## 2018-10-03 DIAGNOSIS — D045 Carcinoma in situ of skin of trunk: Secondary | ICD-10-CM | POA: Diagnosis not present

## 2018-10-03 DIAGNOSIS — B353 Tinea pedis: Secondary | ICD-10-CM | POA: Diagnosis not present

## 2018-10-03 DIAGNOSIS — C44529 Squamous cell carcinoma of skin of other part of trunk: Secondary | ICD-10-CM | POA: Diagnosis not present

## 2018-10-03 DIAGNOSIS — L821 Other seborrheic keratosis: Secondary | ICD-10-CM | POA: Diagnosis not present

## 2018-10-03 DIAGNOSIS — D1801 Hemangioma of skin and subcutaneous tissue: Secondary | ICD-10-CM | POA: Diagnosis not present

## 2018-10-11 DIAGNOSIS — C44529 Squamous cell carcinoma of skin of other part of trunk: Secondary | ICD-10-CM | POA: Diagnosis not present

## 2018-10-11 DIAGNOSIS — Z85828 Personal history of other malignant neoplasm of skin: Secondary | ICD-10-CM | POA: Diagnosis not present

## 2018-10-23 DIAGNOSIS — E1151 Type 2 diabetes mellitus with diabetic peripheral angiopathy without gangrene: Secondary | ICD-10-CM | POA: Diagnosis not present

## 2018-10-23 DIAGNOSIS — Z941 Heart transplant status: Secondary | ICD-10-CM | POA: Diagnosis not present

## 2018-10-23 DIAGNOSIS — I10 Essential (primary) hypertension: Secondary | ICD-10-CM | POA: Diagnosis not present

## 2018-10-23 DIAGNOSIS — M545 Low back pain: Secondary | ICD-10-CM | POA: Diagnosis not present

## 2018-10-24 DIAGNOSIS — Z85828 Personal history of other malignant neoplasm of skin: Secondary | ICD-10-CM | POA: Diagnosis not present

## 2018-10-24 DIAGNOSIS — C44729 Squamous cell carcinoma of skin of left lower limb, including hip: Secondary | ICD-10-CM | POA: Diagnosis not present

## 2018-11-08 DIAGNOSIS — I25759 Atherosclerosis of native coronary artery of transplanted heart with unspecified angina pectoris: Secondary | ICD-10-CM | POA: Diagnosis not present

## 2018-11-08 DIAGNOSIS — Z4821 Encounter for aftercare following heart transplant: Secondary | ICD-10-CM | POA: Diagnosis not present

## 2018-11-08 DIAGNOSIS — Z125 Encounter for screening for malignant neoplasm of prostate: Secondary | ICD-10-CM | POA: Diagnosis not present

## 2018-11-08 DIAGNOSIS — I25750 Atherosclerosis of native coronary artery of transplanted heart with unstable angina: Secondary | ICD-10-CM | POA: Diagnosis not present

## 2018-11-08 DIAGNOSIS — Z950 Presence of cardiac pacemaker: Secondary | ICD-10-CM | POA: Diagnosis not present

## 2018-11-08 DIAGNOSIS — Z13220 Encounter for screening for lipoid disorders: Secondary | ICD-10-CM | POA: Diagnosis not present

## 2018-11-08 DIAGNOSIS — Z45018 Encounter for adjustment and management of other part of cardiac pacemaker: Secondary | ICD-10-CM | POA: Diagnosis not present

## 2018-11-08 DIAGNOSIS — Z79899 Other long term (current) drug therapy: Secondary | ICD-10-CM | POA: Diagnosis not present

## 2018-11-08 DIAGNOSIS — Z941 Heart transplant status: Secondary | ICD-10-CM | POA: Diagnosis not present

## 2018-12-04 DIAGNOSIS — E1151 Type 2 diabetes mellitus with diabetic peripheral angiopathy without gangrene: Secondary | ICD-10-CM | POA: Diagnosis not present

## 2018-12-04 DIAGNOSIS — I10 Essential (primary) hypertension: Secondary | ICD-10-CM | POA: Diagnosis not present

## 2018-12-04 DIAGNOSIS — N183 Chronic kidney disease, stage 3 (moderate): Secondary | ICD-10-CM | POA: Diagnosis not present

## 2018-12-04 DIAGNOSIS — Z794 Long term (current) use of insulin: Secondary | ICD-10-CM | POA: Diagnosis not present

## 2018-12-12 DIAGNOSIS — H43813 Vitreous degeneration, bilateral: Secondary | ICD-10-CM | POA: Diagnosis not present

## 2018-12-12 DIAGNOSIS — H401132 Primary open-angle glaucoma, bilateral, moderate stage: Secondary | ICD-10-CM | POA: Diagnosis not present

## 2018-12-12 DIAGNOSIS — E119 Type 2 diabetes mellitus without complications: Secondary | ICD-10-CM | POA: Diagnosis not present

## 2018-12-12 DIAGNOSIS — H52203 Unspecified astigmatism, bilateral: Secondary | ICD-10-CM | POA: Diagnosis not present

## 2018-12-31 DIAGNOSIS — M799 Soft tissue disorder, unspecified: Secondary | ICD-10-CM | POA: Diagnosis not present

## 2018-12-31 DIAGNOSIS — M2560 Stiffness of unspecified joint, not elsewhere classified: Secondary | ICD-10-CM | POA: Diagnosis not present

## 2018-12-31 DIAGNOSIS — M545 Low back pain: Secondary | ICD-10-CM | POA: Diagnosis not present

## 2018-12-31 DIAGNOSIS — R262 Difficulty in walking, not elsewhere classified: Secondary | ICD-10-CM | POA: Diagnosis not present

## 2018-12-31 DIAGNOSIS — E139 Other specified diabetes mellitus without complications: Secondary | ICD-10-CM | POA: Diagnosis not present

## 2018-12-31 DIAGNOSIS — M5432 Sciatica, left side: Secondary | ICD-10-CM | POA: Diagnosis not present

## 2018-12-31 DIAGNOSIS — M6281 Muscle weakness (generalized): Secondary | ICD-10-CM | POA: Diagnosis not present

## 2018-12-31 DIAGNOSIS — M5431 Sciatica, right side: Secondary | ICD-10-CM | POA: Diagnosis not present

## 2018-12-31 DIAGNOSIS — I10 Essential (primary) hypertension: Secondary | ICD-10-CM | POA: Diagnosis not present

## 2019-01-02 DIAGNOSIS — E139 Other specified diabetes mellitus without complications: Secondary | ICD-10-CM | POA: Diagnosis not present

## 2019-01-02 DIAGNOSIS — M5432 Sciatica, left side: Secondary | ICD-10-CM | POA: Diagnosis not present

## 2019-01-02 DIAGNOSIS — M2560 Stiffness of unspecified joint, not elsewhere classified: Secondary | ICD-10-CM | POA: Diagnosis not present

## 2019-01-02 DIAGNOSIS — M799 Soft tissue disorder, unspecified: Secondary | ICD-10-CM | POA: Diagnosis not present

## 2019-01-02 DIAGNOSIS — M6281 Muscle weakness (generalized): Secondary | ICD-10-CM | POA: Diagnosis not present

## 2019-01-02 DIAGNOSIS — I10 Essential (primary) hypertension: Secondary | ICD-10-CM | POA: Diagnosis not present

## 2019-01-02 DIAGNOSIS — R262 Difficulty in walking, not elsewhere classified: Secondary | ICD-10-CM | POA: Diagnosis not present

## 2019-01-02 DIAGNOSIS — M545 Low back pain: Secondary | ICD-10-CM | POA: Diagnosis not present

## 2019-01-02 DIAGNOSIS — M5431 Sciatica, right side: Secondary | ICD-10-CM | POA: Diagnosis not present

## 2019-01-03 DIAGNOSIS — M545 Low back pain: Secondary | ICD-10-CM | POA: Diagnosis not present

## 2019-01-03 DIAGNOSIS — E139 Other specified diabetes mellitus without complications: Secondary | ICD-10-CM | POA: Diagnosis not present

## 2019-01-03 DIAGNOSIS — M799 Soft tissue disorder, unspecified: Secondary | ICD-10-CM | POA: Diagnosis not present

## 2019-01-03 DIAGNOSIS — M6281 Muscle weakness (generalized): Secondary | ICD-10-CM | POA: Diagnosis not present

## 2019-01-03 DIAGNOSIS — I10 Essential (primary) hypertension: Secondary | ICD-10-CM | POA: Diagnosis not present

## 2019-01-03 DIAGNOSIS — R262 Difficulty in walking, not elsewhere classified: Secondary | ICD-10-CM | POA: Diagnosis not present

## 2019-01-03 DIAGNOSIS — M5431 Sciatica, right side: Secondary | ICD-10-CM | POA: Diagnosis not present

## 2019-01-03 DIAGNOSIS — M5432 Sciatica, left side: Secondary | ICD-10-CM | POA: Diagnosis not present

## 2019-01-03 DIAGNOSIS — M2560 Stiffness of unspecified joint, not elsewhere classified: Secondary | ICD-10-CM | POA: Diagnosis not present

## 2019-01-08 DIAGNOSIS — I10 Essential (primary) hypertension: Secondary | ICD-10-CM | POA: Diagnosis not present

## 2019-01-08 DIAGNOSIS — R262 Difficulty in walking, not elsewhere classified: Secondary | ICD-10-CM | POA: Diagnosis not present

## 2019-01-08 DIAGNOSIS — M545 Low back pain: Secondary | ICD-10-CM | POA: Diagnosis not present

## 2019-01-08 DIAGNOSIS — M5432 Sciatica, left side: Secondary | ICD-10-CM | POA: Diagnosis not present

## 2019-01-08 DIAGNOSIS — M6281 Muscle weakness (generalized): Secondary | ICD-10-CM | POA: Diagnosis not present

## 2019-01-08 DIAGNOSIS — M5431 Sciatica, right side: Secondary | ICD-10-CM | POA: Diagnosis not present

## 2019-01-08 DIAGNOSIS — M2560 Stiffness of unspecified joint, not elsewhere classified: Secondary | ICD-10-CM | POA: Diagnosis not present

## 2019-01-08 DIAGNOSIS — M799 Soft tissue disorder, unspecified: Secondary | ICD-10-CM | POA: Diagnosis not present

## 2019-01-08 DIAGNOSIS — E139 Other specified diabetes mellitus without complications: Secondary | ICD-10-CM | POA: Diagnosis not present

## 2019-01-09 DIAGNOSIS — I10 Essential (primary) hypertension: Secondary | ICD-10-CM | POA: Diagnosis not present

## 2019-01-09 DIAGNOSIS — M799 Soft tissue disorder, unspecified: Secondary | ICD-10-CM | POA: Diagnosis not present

## 2019-01-09 DIAGNOSIS — M5432 Sciatica, left side: Secondary | ICD-10-CM | POA: Diagnosis not present

## 2019-01-09 DIAGNOSIS — M545 Low back pain: Secondary | ICD-10-CM | POA: Diagnosis not present

## 2019-01-09 DIAGNOSIS — M6281 Muscle weakness (generalized): Secondary | ICD-10-CM | POA: Diagnosis not present

## 2019-01-09 DIAGNOSIS — R262 Difficulty in walking, not elsewhere classified: Secondary | ICD-10-CM | POA: Diagnosis not present

## 2019-01-09 DIAGNOSIS — M5431 Sciatica, right side: Secondary | ICD-10-CM | POA: Diagnosis not present

## 2019-01-09 DIAGNOSIS — E139 Other specified diabetes mellitus without complications: Secondary | ICD-10-CM | POA: Diagnosis not present

## 2019-01-09 DIAGNOSIS — M2560 Stiffness of unspecified joint, not elsewhere classified: Secondary | ICD-10-CM | POA: Diagnosis not present

## 2019-01-11 DIAGNOSIS — M5431 Sciatica, right side: Secondary | ICD-10-CM | POA: Diagnosis not present

## 2019-01-11 DIAGNOSIS — M6281 Muscle weakness (generalized): Secondary | ICD-10-CM | POA: Diagnosis not present

## 2019-01-11 DIAGNOSIS — M2560 Stiffness of unspecified joint, not elsewhere classified: Secondary | ICD-10-CM | POA: Diagnosis not present

## 2019-01-11 DIAGNOSIS — M545 Low back pain: Secondary | ICD-10-CM | POA: Diagnosis not present

## 2019-01-11 DIAGNOSIS — R262 Difficulty in walking, not elsewhere classified: Secondary | ICD-10-CM | POA: Diagnosis not present

## 2019-01-11 DIAGNOSIS — E139 Other specified diabetes mellitus without complications: Secondary | ICD-10-CM | POA: Diagnosis not present

## 2019-01-11 DIAGNOSIS — I10 Essential (primary) hypertension: Secondary | ICD-10-CM | POA: Diagnosis not present

## 2019-01-11 DIAGNOSIS — M5432 Sciatica, left side: Secondary | ICD-10-CM | POA: Diagnosis not present

## 2019-01-11 DIAGNOSIS — M799 Soft tissue disorder, unspecified: Secondary | ICD-10-CM | POA: Diagnosis not present

## 2019-01-12 DIAGNOSIS — Z23 Encounter for immunization: Secondary | ICD-10-CM | POA: Diagnosis not present

## 2019-01-18 DIAGNOSIS — M6281 Muscle weakness (generalized): Secondary | ICD-10-CM | POA: Diagnosis not present

## 2019-01-18 DIAGNOSIS — M5432 Sciatica, left side: Secondary | ICD-10-CM | POA: Diagnosis not present

## 2019-01-18 DIAGNOSIS — M5431 Sciatica, right side: Secondary | ICD-10-CM | POA: Diagnosis not present

## 2019-01-18 DIAGNOSIS — M2560 Stiffness of unspecified joint, not elsewhere classified: Secondary | ICD-10-CM | POA: Diagnosis not present

## 2019-01-18 DIAGNOSIS — M799 Soft tissue disorder, unspecified: Secondary | ICD-10-CM | POA: Diagnosis not present

## 2019-01-18 DIAGNOSIS — E139 Other specified diabetes mellitus without complications: Secondary | ICD-10-CM | POA: Diagnosis not present

## 2019-01-18 DIAGNOSIS — R262 Difficulty in walking, not elsewhere classified: Secondary | ICD-10-CM | POA: Diagnosis not present

## 2019-01-18 DIAGNOSIS — I10 Essential (primary) hypertension: Secondary | ICD-10-CM | POA: Diagnosis not present

## 2019-01-18 DIAGNOSIS — M545 Low back pain: Secondary | ICD-10-CM | POA: Diagnosis not present

## 2019-01-21 DIAGNOSIS — M5432 Sciatica, left side: Secondary | ICD-10-CM | POA: Diagnosis not present

## 2019-01-21 DIAGNOSIS — M2560 Stiffness of unspecified joint, not elsewhere classified: Secondary | ICD-10-CM | POA: Diagnosis not present

## 2019-01-21 DIAGNOSIS — M799 Soft tissue disorder, unspecified: Secondary | ICD-10-CM | POA: Diagnosis not present

## 2019-01-21 DIAGNOSIS — E139 Other specified diabetes mellitus without complications: Secondary | ICD-10-CM | POA: Diagnosis not present

## 2019-01-21 DIAGNOSIS — M545 Low back pain: Secondary | ICD-10-CM | POA: Diagnosis not present

## 2019-01-21 DIAGNOSIS — I10 Essential (primary) hypertension: Secondary | ICD-10-CM | POA: Diagnosis not present

## 2019-01-21 DIAGNOSIS — M6281 Muscle weakness (generalized): Secondary | ICD-10-CM | POA: Diagnosis not present

## 2019-01-21 DIAGNOSIS — M5431 Sciatica, right side: Secondary | ICD-10-CM | POA: Diagnosis not present

## 2019-01-21 DIAGNOSIS — R262 Difficulty in walking, not elsewhere classified: Secondary | ICD-10-CM | POA: Diagnosis not present

## 2019-01-23 DIAGNOSIS — M5431 Sciatica, right side: Secondary | ICD-10-CM | POA: Diagnosis not present

## 2019-01-23 DIAGNOSIS — M6281 Muscle weakness (generalized): Secondary | ICD-10-CM | POA: Diagnosis not present

## 2019-01-23 DIAGNOSIS — M545 Low back pain: Secondary | ICD-10-CM | POA: Diagnosis not present

## 2019-01-23 DIAGNOSIS — R262 Difficulty in walking, not elsewhere classified: Secondary | ICD-10-CM | POA: Diagnosis not present

## 2019-01-23 DIAGNOSIS — M5432 Sciatica, left side: Secondary | ICD-10-CM | POA: Diagnosis not present

## 2019-01-23 DIAGNOSIS — M2560 Stiffness of unspecified joint, not elsewhere classified: Secondary | ICD-10-CM | POA: Diagnosis not present

## 2019-01-23 DIAGNOSIS — M799 Soft tissue disorder, unspecified: Secondary | ICD-10-CM | POA: Diagnosis not present

## 2019-01-23 DIAGNOSIS — E139 Other specified diabetes mellitus without complications: Secondary | ICD-10-CM | POA: Diagnosis not present

## 2019-01-23 DIAGNOSIS — I10 Essential (primary) hypertension: Secondary | ICD-10-CM | POA: Diagnosis not present

## 2019-01-24 DIAGNOSIS — M2560 Stiffness of unspecified joint, not elsewhere classified: Secondary | ICD-10-CM | POA: Diagnosis not present

## 2019-01-24 DIAGNOSIS — M6281 Muscle weakness (generalized): Secondary | ICD-10-CM | POA: Diagnosis not present

## 2019-01-24 DIAGNOSIS — M545 Low back pain: Secondary | ICD-10-CM | POA: Diagnosis not present

## 2019-01-24 DIAGNOSIS — M799 Soft tissue disorder, unspecified: Secondary | ICD-10-CM | POA: Diagnosis not present

## 2019-01-24 DIAGNOSIS — I10 Essential (primary) hypertension: Secondary | ICD-10-CM | POA: Diagnosis not present

## 2019-01-24 DIAGNOSIS — M5432 Sciatica, left side: Secondary | ICD-10-CM | POA: Diagnosis not present

## 2019-01-24 DIAGNOSIS — M5431 Sciatica, right side: Secondary | ICD-10-CM | POA: Diagnosis not present

## 2019-01-24 DIAGNOSIS — R262 Difficulty in walking, not elsewhere classified: Secondary | ICD-10-CM | POA: Diagnosis not present

## 2019-01-24 DIAGNOSIS — E139 Other specified diabetes mellitus without complications: Secondary | ICD-10-CM | POA: Diagnosis not present

## 2019-01-28 DIAGNOSIS — E139 Other specified diabetes mellitus without complications: Secondary | ICD-10-CM | POA: Diagnosis not present

## 2019-01-28 DIAGNOSIS — I10 Essential (primary) hypertension: Secondary | ICD-10-CM | POA: Diagnosis not present

## 2019-01-28 DIAGNOSIS — M5431 Sciatica, right side: Secondary | ICD-10-CM | POA: Diagnosis not present

## 2019-01-28 DIAGNOSIS — M5432 Sciatica, left side: Secondary | ICD-10-CM | POA: Diagnosis not present

## 2019-01-28 DIAGNOSIS — M6281 Muscle weakness (generalized): Secondary | ICD-10-CM | POA: Diagnosis not present

## 2019-01-28 DIAGNOSIS — M545 Low back pain: Secondary | ICD-10-CM | POA: Diagnosis not present

## 2019-01-28 DIAGNOSIS — M2560 Stiffness of unspecified joint, not elsewhere classified: Secondary | ICD-10-CM | POA: Diagnosis not present

## 2019-01-28 DIAGNOSIS — M799 Soft tissue disorder, unspecified: Secondary | ICD-10-CM | POA: Diagnosis not present

## 2019-01-28 DIAGNOSIS — R262 Difficulty in walking, not elsewhere classified: Secondary | ICD-10-CM | POA: Diagnosis not present

## 2019-02-01 DIAGNOSIS — M2560 Stiffness of unspecified joint, not elsewhere classified: Secondary | ICD-10-CM | POA: Diagnosis not present

## 2019-02-01 DIAGNOSIS — R262 Difficulty in walking, not elsewhere classified: Secondary | ICD-10-CM | POA: Diagnosis not present

## 2019-02-01 DIAGNOSIS — M5432 Sciatica, left side: Secondary | ICD-10-CM | POA: Diagnosis not present

## 2019-02-01 DIAGNOSIS — I10 Essential (primary) hypertension: Secondary | ICD-10-CM | POA: Diagnosis not present

## 2019-02-01 DIAGNOSIS — M545 Low back pain: Secondary | ICD-10-CM | POA: Diagnosis not present

## 2019-02-01 DIAGNOSIS — M799 Soft tissue disorder, unspecified: Secondary | ICD-10-CM | POA: Diagnosis not present

## 2019-02-01 DIAGNOSIS — M5431 Sciatica, right side: Secondary | ICD-10-CM | POA: Diagnosis not present

## 2019-02-01 DIAGNOSIS — E139 Other specified diabetes mellitus without complications: Secondary | ICD-10-CM | POA: Diagnosis not present

## 2019-02-01 DIAGNOSIS — M6281 Muscle weakness (generalized): Secondary | ICD-10-CM | POA: Diagnosis not present

## 2019-02-04 DIAGNOSIS — I10 Essential (primary) hypertension: Secondary | ICD-10-CM | POA: Diagnosis not present

## 2019-02-04 DIAGNOSIS — M5432 Sciatica, left side: Secondary | ICD-10-CM | POA: Diagnosis not present

## 2019-02-04 DIAGNOSIS — M2560 Stiffness of unspecified joint, not elsewhere classified: Secondary | ICD-10-CM | POA: Diagnosis not present

## 2019-02-04 DIAGNOSIS — R262 Difficulty in walking, not elsewhere classified: Secondary | ICD-10-CM | POA: Diagnosis not present

## 2019-02-04 DIAGNOSIS — E139 Other specified diabetes mellitus without complications: Secondary | ICD-10-CM | POA: Diagnosis not present

## 2019-02-04 DIAGNOSIS — M545 Low back pain: Secondary | ICD-10-CM | POA: Diagnosis not present

## 2019-02-04 DIAGNOSIS — M799 Soft tissue disorder, unspecified: Secondary | ICD-10-CM | POA: Diagnosis not present

## 2019-02-04 DIAGNOSIS — M5431 Sciatica, right side: Secondary | ICD-10-CM | POA: Diagnosis not present

## 2019-02-04 DIAGNOSIS — M6281 Muscle weakness (generalized): Secondary | ICD-10-CM | POA: Diagnosis not present

## 2019-02-11 DIAGNOSIS — M545 Low back pain: Secondary | ICD-10-CM | POA: Diagnosis not present

## 2019-02-11 DIAGNOSIS — M6281 Muscle weakness (generalized): Secondary | ICD-10-CM | POA: Diagnosis not present

## 2019-02-11 DIAGNOSIS — M5432 Sciatica, left side: Secondary | ICD-10-CM | POA: Diagnosis not present

## 2019-02-11 DIAGNOSIS — R262 Difficulty in walking, not elsewhere classified: Secondary | ICD-10-CM | POA: Diagnosis not present

## 2019-02-11 DIAGNOSIS — M2560 Stiffness of unspecified joint, not elsewhere classified: Secondary | ICD-10-CM | POA: Diagnosis not present

## 2019-02-11 DIAGNOSIS — M799 Soft tissue disorder, unspecified: Secondary | ICD-10-CM | POA: Diagnosis not present

## 2019-02-11 DIAGNOSIS — M5431 Sciatica, right side: Secondary | ICD-10-CM | POA: Diagnosis not present

## 2019-02-11 DIAGNOSIS — E139 Other specified diabetes mellitus without complications: Secondary | ICD-10-CM | POA: Diagnosis not present

## 2019-02-11 DIAGNOSIS — I10 Essential (primary) hypertension: Secondary | ICD-10-CM | POA: Diagnosis not present

## 2019-02-13 DIAGNOSIS — M545 Low back pain: Secondary | ICD-10-CM | POA: Diagnosis not present

## 2019-02-13 DIAGNOSIS — M2560 Stiffness of unspecified joint, not elsewhere classified: Secondary | ICD-10-CM | POA: Diagnosis not present

## 2019-02-13 DIAGNOSIS — M799 Soft tissue disorder, unspecified: Secondary | ICD-10-CM | POA: Diagnosis not present

## 2019-02-13 DIAGNOSIS — M5431 Sciatica, right side: Secondary | ICD-10-CM | POA: Diagnosis not present

## 2019-02-13 DIAGNOSIS — E139 Other specified diabetes mellitus without complications: Secondary | ICD-10-CM | POA: Diagnosis not present

## 2019-02-13 DIAGNOSIS — I10 Essential (primary) hypertension: Secondary | ICD-10-CM | POA: Diagnosis not present

## 2019-02-13 DIAGNOSIS — M5432 Sciatica, left side: Secondary | ICD-10-CM | POA: Diagnosis not present

## 2019-02-13 DIAGNOSIS — M6281 Muscle weakness (generalized): Secondary | ICD-10-CM | POA: Diagnosis not present

## 2019-02-13 DIAGNOSIS — R262 Difficulty in walking, not elsewhere classified: Secondary | ICD-10-CM | POA: Diagnosis not present

## 2019-02-15 DIAGNOSIS — M799 Soft tissue disorder, unspecified: Secondary | ICD-10-CM | POA: Diagnosis not present

## 2019-02-15 DIAGNOSIS — I10 Essential (primary) hypertension: Secondary | ICD-10-CM | POA: Diagnosis not present

## 2019-02-15 DIAGNOSIS — R262 Difficulty in walking, not elsewhere classified: Secondary | ICD-10-CM | POA: Diagnosis not present

## 2019-02-15 DIAGNOSIS — M545 Low back pain: Secondary | ICD-10-CM | POA: Diagnosis not present

## 2019-02-15 DIAGNOSIS — M6281 Muscle weakness (generalized): Secondary | ICD-10-CM | POA: Diagnosis not present

## 2019-02-15 DIAGNOSIS — M5431 Sciatica, right side: Secondary | ICD-10-CM | POA: Diagnosis not present

## 2019-02-15 DIAGNOSIS — M2560 Stiffness of unspecified joint, not elsewhere classified: Secondary | ICD-10-CM | POA: Diagnosis not present

## 2019-02-15 DIAGNOSIS — M5432 Sciatica, left side: Secondary | ICD-10-CM | POA: Diagnosis not present

## 2019-02-15 DIAGNOSIS — E139 Other specified diabetes mellitus without complications: Secondary | ICD-10-CM | POA: Diagnosis not present

## 2019-02-20 DIAGNOSIS — M545 Low back pain: Secondary | ICD-10-CM | POA: Diagnosis not present

## 2019-02-20 DIAGNOSIS — I10 Essential (primary) hypertension: Secondary | ICD-10-CM | POA: Diagnosis not present

## 2019-02-20 DIAGNOSIS — M799 Soft tissue disorder, unspecified: Secondary | ICD-10-CM | POA: Diagnosis not present

## 2019-02-20 DIAGNOSIS — M2560 Stiffness of unspecified joint, not elsewhere classified: Secondary | ICD-10-CM | POA: Diagnosis not present

## 2019-02-20 DIAGNOSIS — R262 Difficulty in walking, not elsewhere classified: Secondary | ICD-10-CM | POA: Diagnosis not present

## 2019-02-20 DIAGNOSIS — M6281 Muscle weakness (generalized): Secondary | ICD-10-CM | POA: Diagnosis not present

## 2019-02-20 DIAGNOSIS — M5431 Sciatica, right side: Secondary | ICD-10-CM | POA: Diagnosis not present

## 2019-02-20 DIAGNOSIS — M5432 Sciatica, left side: Secondary | ICD-10-CM | POA: Diagnosis not present

## 2019-02-20 DIAGNOSIS — E139 Other specified diabetes mellitus without complications: Secondary | ICD-10-CM | POA: Diagnosis not present

## 2019-02-21 DIAGNOSIS — Z941 Heart transplant status: Secondary | ICD-10-CM | POA: Diagnosis not present

## 2019-02-21 DIAGNOSIS — N320 Bladder-neck obstruction: Secondary | ICD-10-CM | POA: Diagnosis not present

## 2019-02-21 DIAGNOSIS — I739 Peripheral vascular disease, unspecified: Secondary | ICD-10-CM | POA: Diagnosis not present

## 2019-02-21 DIAGNOSIS — E1151 Type 2 diabetes mellitus with diabetic peripheral angiopathy without gangrene: Secondary | ICD-10-CM | POA: Diagnosis not present

## 2019-02-21 DIAGNOSIS — I10 Essential (primary) hypertension: Secondary | ICD-10-CM | POA: Diagnosis not present

## 2019-02-21 DIAGNOSIS — D8989 Other specified disorders involving the immune mechanism, not elsewhere classified: Secondary | ICD-10-CM | POA: Diagnosis not present

## 2019-02-21 DIAGNOSIS — M545 Low back pain: Secondary | ICD-10-CM | POA: Diagnosis not present

## 2019-02-22 DIAGNOSIS — M2560 Stiffness of unspecified joint, not elsewhere classified: Secondary | ICD-10-CM | POA: Diagnosis not present

## 2019-02-22 DIAGNOSIS — M6281 Muscle weakness (generalized): Secondary | ICD-10-CM | POA: Diagnosis not present

## 2019-02-22 DIAGNOSIS — M799 Soft tissue disorder, unspecified: Secondary | ICD-10-CM | POA: Diagnosis not present

## 2019-02-22 DIAGNOSIS — I10 Essential (primary) hypertension: Secondary | ICD-10-CM | POA: Diagnosis not present

## 2019-02-22 DIAGNOSIS — M5432 Sciatica, left side: Secondary | ICD-10-CM | POA: Diagnosis not present

## 2019-02-22 DIAGNOSIS — E139 Other specified diabetes mellitus without complications: Secondary | ICD-10-CM | POA: Diagnosis not present

## 2019-02-22 DIAGNOSIS — R262 Difficulty in walking, not elsewhere classified: Secondary | ICD-10-CM | POA: Diagnosis not present

## 2019-02-22 DIAGNOSIS — M545 Low back pain: Secondary | ICD-10-CM | POA: Diagnosis not present

## 2019-02-22 DIAGNOSIS — M5431 Sciatica, right side: Secondary | ICD-10-CM | POA: Diagnosis not present

## 2019-02-25 DIAGNOSIS — M2560 Stiffness of unspecified joint, not elsewhere classified: Secondary | ICD-10-CM | POA: Diagnosis not present

## 2019-02-25 DIAGNOSIS — M6281 Muscle weakness (generalized): Secondary | ICD-10-CM | POA: Diagnosis not present

## 2019-02-25 DIAGNOSIS — M5432 Sciatica, left side: Secondary | ICD-10-CM | POA: Diagnosis not present

## 2019-02-25 DIAGNOSIS — M545 Low back pain: Secondary | ICD-10-CM | POA: Diagnosis not present

## 2019-02-25 DIAGNOSIS — E139 Other specified diabetes mellitus without complications: Secondary | ICD-10-CM | POA: Diagnosis not present

## 2019-02-25 DIAGNOSIS — M5431 Sciatica, right side: Secondary | ICD-10-CM | POA: Diagnosis not present

## 2019-02-25 DIAGNOSIS — M799 Soft tissue disorder, unspecified: Secondary | ICD-10-CM | POA: Diagnosis not present

## 2019-02-25 DIAGNOSIS — I10 Essential (primary) hypertension: Secondary | ICD-10-CM | POA: Diagnosis not present

## 2019-02-25 DIAGNOSIS — R262 Difficulty in walking, not elsewhere classified: Secondary | ICD-10-CM | POA: Diagnosis not present

## 2019-02-26 DIAGNOSIS — Z941 Heart transplant status: Secondary | ICD-10-CM | POA: Diagnosis not present

## 2019-02-26 DIAGNOSIS — Z794 Long term (current) use of insulin: Secondary | ICD-10-CM | POA: Diagnosis not present

## 2019-02-26 DIAGNOSIS — N183 Chronic kidney disease, stage 3 unspecified: Secondary | ICD-10-CM | POA: Diagnosis not present

## 2019-02-26 DIAGNOSIS — I10 Essential (primary) hypertension: Secondary | ICD-10-CM | POA: Diagnosis not present

## 2019-02-26 DIAGNOSIS — E1151 Type 2 diabetes mellitus with diabetic peripheral angiopathy without gangrene: Secondary | ICD-10-CM | POA: Diagnosis not present

## 2019-02-27 DIAGNOSIS — M545 Low back pain: Secondary | ICD-10-CM | POA: Diagnosis not present

## 2019-02-27 DIAGNOSIS — M6281 Muscle weakness (generalized): Secondary | ICD-10-CM | POA: Diagnosis not present

## 2019-02-27 DIAGNOSIS — M5432 Sciatica, left side: Secondary | ICD-10-CM | POA: Diagnosis not present

## 2019-02-27 DIAGNOSIS — R262 Difficulty in walking, not elsewhere classified: Secondary | ICD-10-CM | POA: Diagnosis not present

## 2019-02-27 DIAGNOSIS — E139 Other specified diabetes mellitus without complications: Secondary | ICD-10-CM | POA: Diagnosis not present

## 2019-02-27 DIAGNOSIS — I10 Essential (primary) hypertension: Secondary | ICD-10-CM | POA: Diagnosis not present

## 2019-02-27 DIAGNOSIS — M799 Soft tissue disorder, unspecified: Secondary | ICD-10-CM | POA: Diagnosis not present

## 2019-02-27 DIAGNOSIS — M2560 Stiffness of unspecified joint, not elsewhere classified: Secondary | ICD-10-CM | POA: Diagnosis not present

## 2019-02-27 DIAGNOSIS — M5431 Sciatica, right side: Secondary | ICD-10-CM | POA: Diagnosis not present

## 2019-03-01 DIAGNOSIS — M5432 Sciatica, left side: Secondary | ICD-10-CM | POA: Diagnosis not present

## 2019-03-01 DIAGNOSIS — M2560 Stiffness of unspecified joint, not elsewhere classified: Secondary | ICD-10-CM | POA: Diagnosis not present

## 2019-03-01 DIAGNOSIS — M6281 Muscle weakness (generalized): Secondary | ICD-10-CM | POA: Diagnosis not present

## 2019-03-01 DIAGNOSIS — M799 Soft tissue disorder, unspecified: Secondary | ICD-10-CM | POA: Diagnosis not present

## 2019-03-01 DIAGNOSIS — R262 Difficulty in walking, not elsewhere classified: Secondary | ICD-10-CM | POA: Diagnosis not present

## 2019-03-01 DIAGNOSIS — M545 Low back pain: Secondary | ICD-10-CM | POA: Diagnosis not present

## 2019-03-01 DIAGNOSIS — M5431 Sciatica, right side: Secondary | ICD-10-CM | POA: Diagnosis not present

## 2019-03-01 DIAGNOSIS — E139 Other specified diabetes mellitus without complications: Secondary | ICD-10-CM | POA: Diagnosis not present

## 2019-03-01 DIAGNOSIS — I10 Essential (primary) hypertension: Secondary | ICD-10-CM | POA: Diagnosis not present

## 2019-03-04 DIAGNOSIS — M2560 Stiffness of unspecified joint, not elsewhere classified: Secondary | ICD-10-CM | POA: Diagnosis not present

## 2019-03-04 DIAGNOSIS — I10 Essential (primary) hypertension: Secondary | ICD-10-CM | POA: Diagnosis not present

## 2019-03-04 DIAGNOSIS — M799 Soft tissue disorder, unspecified: Secondary | ICD-10-CM | POA: Diagnosis not present

## 2019-03-04 DIAGNOSIS — M545 Low back pain: Secondary | ICD-10-CM | POA: Diagnosis not present

## 2019-03-04 DIAGNOSIS — R262 Difficulty in walking, not elsewhere classified: Secondary | ICD-10-CM | POA: Diagnosis not present

## 2019-03-04 DIAGNOSIS — E139 Other specified diabetes mellitus without complications: Secondary | ICD-10-CM | POA: Diagnosis not present

## 2019-03-04 DIAGNOSIS — M6281 Muscle weakness (generalized): Secondary | ICD-10-CM | POA: Diagnosis not present

## 2019-03-04 DIAGNOSIS — M5432 Sciatica, left side: Secondary | ICD-10-CM | POA: Diagnosis not present

## 2019-03-04 DIAGNOSIS — M5431 Sciatica, right side: Secondary | ICD-10-CM | POA: Diagnosis not present

## 2019-03-06 DIAGNOSIS — I10 Essential (primary) hypertension: Secondary | ICD-10-CM | POA: Diagnosis not present

## 2019-03-06 DIAGNOSIS — M5431 Sciatica, right side: Secondary | ICD-10-CM | POA: Diagnosis not present

## 2019-03-06 DIAGNOSIS — M6281 Muscle weakness (generalized): Secondary | ICD-10-CM | POA: Diagnosis not present

## 2019-03-06 DIAGNOSIS — M799 Soft tissue disorder, unspecified: Secondary | ICD-10-CM | POA: Diagnosis not present

## 2019-03-06 DIAGNOSIS — R262 Difficulty in walking, not elsewhere classified: Secondary | ICD-10-CM | POA: Diagnosis not present

## 2019-03-06 DIAGNOSIS — M2560 Stiffness of unspecified joint, not elsewhere classified: Secondary | ICD-10-CM | POA: Diagnosis not present

## 2019-03-06 DIAGNOSIS — M5432 Sciatica, left side: Secondary | ICD-10-CM | POA: Diagnosis not present

## 2019-03-06 DIAGNOSIS — E139 Other specified diabetes mellitus without complications: Secondary | ICD-10-CM | POA: Diagnosis not present

## 2019-03-06 DIAGNOSIS — M545 Low back pain: Secondary | ICD-10-CM | POA: Diagnosis not present

## 2019-03-08 DIAGNOSIS — R262 Difficulty in walking, not elsewhere classified: Secondary | ICD-10-CM | POA: Diagnosis not present

## 2019-03-08 DIAGNOSIS — M2560 Stiffness of unspecified joint, not elsewhere classified: Secondary | ICD-10-CM | POA: Diagnosis not present

## 2019-03-08 DIAGNOSIS — E139 Other specified diabetes mellitus without complications: Secondary | ICD-10-CM | POA: Diagnosis not present

## 2019-03-08 DIAGNOSIS — I10 Essential (primary) hypertension: Secondary | ICD-10-CM | POA: Diagnosis not present

## 2019-03-08 DIAGNOSIS — M799 Soft tissue disorder, unspecified: Secondary | ICD-10-CM | POA: Diagnosis not present

## 2019-03-08 DIAGNOSIS — M545 Low back pain: Secondary | ICD-10-CM | POA: Diagnosis not present

## 2019-03-08 DIAGNOSIS — M5431 Sciatica, right side: Secondary | ICD-10-CM | POA: Diagnosis not present

## 2019-03-08 DIAGNOSIS — M5432 Sciatica, left side: Secondary | ICD-10-CM | POA: Diagnosis not present

## 2019-03-08 DIAGNOSIS — M6281 Muscle weakness (generalized): Secondary | ICD-10-CM | POA: Diagnosis not present

## 2019-03-11 DIAGNOSIS — M799 Soft tissue disorder, unspecified: Secondary | ICD-10-CM | POA: Diagnosis not present

## 2019-03-11 DIAGNOSIS — M5432 Sciatica, left side: Secondary | ICD-10-CM | POA: Diagnosis not present

## 2019-03-11 DIAGNOSIS — M2560 Stiffness of unspecified joint, not elsewhere classified: Secondary | ICD-10-CM | POA: Diagnosis not present

## 2019-03-11 DIAGNOSIS — M5431 Sciatica, right side: Secondary | ICD-10-CM | POA: Diagnosis not present

## 2019-03-11 DIAGNOSIS — M545 Low back pain: Secondary | ICD-10-CM | POA: Diagnosis not present

## 2019-03-11 DIAGNOSIS — E139 Other specified diabetes mellitus without complications: Secondary | ICD-10-CM | POA: Diagnosis not present

## 2019-03-11 DIAGNOSIS — M6281 Muscle weakness (generalized): Secondary | ICD-10-CM | POA: Diagnosis not present

## 2019-03-11 DIAGNOSIS — R262 Difficulty in walking, not elsewhere classified: Secondary | ICD-10-CM | POA: Diagnosis not present

## 2019-03-11 DIAGNOSIS — I10 Essential (primary) hypertension: Secondary | ICD-10-CM | POA: Diagnosis not present

## 2019-03-14 DIAGNOSIS — M5432 Sciatica, left side: Secondary | ICD-10-CM | POA: Diagnosis not present

## 2019-03-14 DIAGNOSIS — M6281 Muscle weakness (generalized): Secondary | ICD-10-CM | POA: Diagnosis not present

## 2019-03-14 DIAGNOSIS — R262 Difficulty in walking, not elsewhere classified: Secondary | ICD-10-CM | POA: Diagnosis not present

## 2019-03-14 DIAGNOSIS — M5431 Sciatica, right side: Secondary | ICD-10-CM | POA: Diagnosis not present

## 2019-03-14 DIAGNOSIS — M545 Low back pain: Secondary | ICD-10-CM | POA: Diagnosis not present

## 2019-03-14 DIAGNOSIS — I10 Essential (primary) hypertension: Secondary | ICD-10-CM | POA: Diagnosis not present

## 2019-03-14 DIAGNOSIS — E139 Other specified diabetes mellitus without complications: Secondary | ICD-10-CM | POA: Diagnosis not present

## 2019-03-14 DIAGNOSIS — M2560 Stiffness of unspecified joint, not elsewhere classified: Secondary | ICD-10-CM | POA: Diagnosis not present

## 2019-03-14 DIAGNOSIS — M799 Soft tissue disorder, unspecified: Secondary | ICD-10-CM | POA: Diagnosis not present

## 2019-04-10 DIAGNOSIS — E1122 Type 2 diabetes mellitus with diabetic chronic kidney disease: Secondary | ICD-10-CM | POA: Diagnosis not present

## 2019-04-10 DIAGNOSIS — Z48298 Encounter for aftercare following other organ transplant: Secondary | ICD-10-CM | POA: Diagnosis not present

## 2019-04-10 DIAGNOSIS — R000 Tachycardia, unspecified: Secondary | ICD-10-CM | POA: Diagnosis not present

## 2019-04-10 DIAGNOSIS — I517 Cardiomegaly: Secondary | ICD-10-CM | POA: Diagnosis not present

## 2019-04-10 DIAGNOSIS — I10 Essential (primary) hypertension: Secondary | ICD-10-CM | POA: Diagnosis not present

## 2019-04-10 DIAGNOSIS — D849 Immunodeficiency, unspecified: Secondary | ICD-10-CM | POA: Diagnosis not present

## 2019-04-10 DIAGNOSIS — Z4821 Encounter for aftercare following heart transplant: Secondary | ICD-10-CM | POA: Diagnosis not present

## 2019-04-10 DIAGNOSIS — N183 Chronic kidney disease, stage 3 unspecified: Secondary | ICD-10-CM | POA: Diagnosis not present

## 2019-04-10 DIAGNOSIS — Z941 Heart transplant status: Secondary | ICD-10-CM | POA: Diagnosis not present

## 2019-04-10 DIAGNOSIS — I129 Hypertensive chronic kidney disease with stage 1 through stage 4 chronic kidney disease, or unspecified chronic kidney disease: Secondary | ICD-10-CM | POA: Diagnosis not present

## 2019-05-30 ENCOUNTER — Emergency Department (HOSPITAL_COMMUNITY): Payer: Medicare Other

## 2019-05-30 ENCOUNTER — Other Ambulatory Visit: Payer: Self-pay

## 2019-05-30 ENCOUNTER — Encounter (HOSPITAL_COMMUNITY): Payer: Self-pay | Admitting: Emergency Medicine

## 2019-05-30 ENCOUNTER — Observation Stay (HOSPITAL_COMMUNITY)
Admission: EM | Admit: 2019-05-30 | Discharge: 2019-06-01 | Disposition: A | Discharge status: Home / Self Care | Payer: Medicare Other | Attending: Internal Medicine | Admitting: Internal Medicine

## 2019-05-30 DIAGNOSIS — Z941 Heart transplant status: Secondary | ICD-10-CM | POA: Diagnosis not present

## 2019-05-30 DIAGNOSIS — Z86718 Personal history of other venous thrombosis and embolism: Secondary | ICD-10-CM | POA: Insufficient documentation

## 2019-05-30 DIAGNOSIS — B460 Pulmonary mucormycosis: Secondary | ICD-10-CM

## 2019-05-30 DIAGNOSIS — I129 Hypertensive chronic kidney disease with stage 1 through stage 4 chronic kidney disease, or unspecified chronic kidney disease: Secondary | ICD-10-CM | POA: Diagnosis not present

## 2019-05-30 DIAGNOSIS — E1122 Type 2 diabetes mellitus with diabetic chronic kidney disease: Secondary | ICD-10-CM | POA: Insufficient documentation

## 2019-05-30 DIAGNOSIS — I10 Essential (primary) hypertension: Secondary | ICD-10-CM

## 2019-05-30 DIAGNOSIS — R531 Weakness: Secondary | ICD-10-CM | POA: Diagnosis present

## 2019-05-30 DIAGNOSIS — R000 Tachycardia, unspecified: Secondary | ICD-10-CM | POA: Diagnosis not present

## 2019-05-30 DIAGNOSIS — W19XXXA Unspecified fall, initial encounter: Secondary | ICD-10-CM | POA: Diagnosis present

## 2019-05-30 DIAGNOSIS — N183 Chronic kidney disease, stage 3 unspecified: Secondary | ICD-10-CM

## 2019-05-30 DIAGNOSIS — Z7982 Long term (current) use of aspirin: Secondary | ICD-10-CM | POA: Diagnosis not present

## 2019-05-30 DIAGNOSIS — I25811 Atherosclerosis of native coronary artery of transplanted heart without angina pectoris: Secondary | ICD-10-CM | POA: Insufficient documentation

## 2019-05-30 DIAGNOSIS — U071 COVID-19: Principal | ICD-10-CM | POA: Insufficient documentation

## 2019-05-30 DIAGNOSIS — Z950 Presence of cardiac pacemaker: Secondary | ICD-10-CM | POA: Diagnosis not present

## 2019-05-30 DIAGNOSIS — D696 Thrombocytopenia, unspecified: Secondary | ICD-10-CM

## 2019-05-30 DIAGNOSIS — Z794 Long term (current) use of insulin: Secondary | ICD-10-CM | POA: Diagnosis not present

## 2019-05-30 DIAGNOSIS — N179 Acute kidney failure, unspecified: Secondary | ICD-10-CM

## 2019-05-30 DIAGNOSIS — Z20822 Contact with and (suspected) exposure to covid-19: Secondary | ICD-10-CM | POA: Diagnosis not present

## 2019-05-30 DIAGNOSIS — Z79899 Other long term (current) drug therapy: Secondary | ICD-10-CM | POA: Insufficient documentation

## 2019-05-30 DIAGNOSIS — Z7952 Long term (current) use of systemic steroids: Secondary | ICD-10-CM | POA: Diagnosis not present

## 2019-05-30 DIAGNOSIS — A419 Sepsis, unspecified organism: Secondary | ICD-10-CM

## 2019-05-30 DIAGNOSIS — N1831 Chronic kidney disease, stage 3a: Secondary | ICD-10-CM | POA: Diagnosis not present

## 2019-05-30 DIAGNOSIS — I2510 Atherosclerotic heart disease of native coronary artery without angina pectoris: Secondary | ICD-10-CM

## 2019-05-30 DIAGNOSIS — W07XXXA Fall from chair, initial encounter: Secondary | ICD-10-CM | POA: Insufficient documentation

## 2019-05-30 DIAGNOSIS — Z7902 Long term (current) use of antithrombotics/antiplatelets: Secondary | ICD-10-CM | POA: Insufficient documentation

## 2019-05-30 HISTORY — DX: Presence of cardiac pacemaker: Z95.0

## 2019-05-30 LAB — COMPREHENSIVE METABOLIC PANEL
ALT: 24 U/L (ref 0–44)
AST: 20 U/L (ref 15–41)
Albumin: 4.2 g/dL (ref 3.5–5.0)
Alkaline Phosphatase: 68 U/L (ref 38–126)
Anion gap: 15 (ref 5–15)
BUN: 29 mg/dL — ABNORMAL HIGH (ref 8–23)
CO2: 20 mmol/L — ABNORMAL LOW (ref 22–32)
Calcium: 9 mg/dL (ref 8.9–10.3)
Chloride: 95 mmol/L — ABNORMAL LOW (ref 98–111)
Creatinine, Ser: 1.6 mg/dL — ABNORMAL HIGH (ref 0.61–1.24)
GFR calc Af Amer: 47 mL/min — ABNORMAL LOW (ref >60)
GFR calc non Af Amer: 41 mL/min — ABNORMAL LOW (ref >60)
Glucose, Bld: 381 mg/dL — ABNORMAL HIGH (ref 70–99)
Potassium: 4.3 mmol/L (ref 3.5–5.1)
Sodium: 130 mmol/L — ABNORMAL LOW (ref 135–145)
Total Bilirubin: 1.1 mg/dL (ref 0.3–1.2)
Total Protein: 6.8 g/dL (ref 6.5–8.1)

## 2019-05-30 LAB — CBC WITH DIFFERENTIAL/PLATELET
Abs Immature Granulocytes: 0.02 10*3/uL (ref 0.00–0.07)
Basophils Absolute: 0 10*3/uL (ref 0.0–0.1)
Basophils Relative: 0 %
Eosinophils Absolute: 0 10*3/uL (ref 0.0–0.5)
Eosinophils Relative: 1 %
HCT: 44.6 % (ref 39.0–52.0)
Hemoglobin: 14.7 g/dL (ref 13.0–17.0)
Immature Granulocytes: 1 %
Lymphocytes Relative: 13 %
Lymphs Abs: 0.5 10*3/uL — ABNORMAL LOW (ref 0.7–4.0)
MCH: 31.1 pg (ref 26.0–34.0)
MCHC: 33 g/dL (ref 30.0–36.0)
MCV: 94.5 fL (ref 80.0–100.0)
Monocytes Absolute: 0.7 10*3/uL (ref 0.1–1.0)
Monocytes Relative: 17 %
Neutro Abs: 2.8 10*3/uL (ref 1.7–7.7)
Neutrophils Relative %: 68 %
Platelets: 98 10*3/uL — ABNORMAL LOW (ref 150–400)
RBC: 4.72 MIL/uL (ref 4.22–5.81)
RDW: 12.3 % (ref 11.5–15.5)
WBC: 4 10*3/uL (ref 4.0–10.5)
nRBC: 0 % (ref 0.0–0.2)

## 2019-05-30 MED ORDER — SODIUM CHLORIDE 0.9 % IV SOLN
1000.0000 mL | INTRAVENOUS | Status: DC
  Administered 2019-05-30: 1000 mL via INTRAVENOUS

## 2019-05-30 NOTE — ED Provider Notes (Signed)
[facility] EMERGENCY DEPARTMENT Provider Note   CSN: [id number] Arrival date & time: 05/30/19  2050     History Chief Complaint  Patient presents with   Brandon [name] is a 79 y.o. male with a hx of IDDM, HTN, CVA, Heart transplant 2006 ([facility]), Pacemaker presents to the Emergency Department complaining of gradual, persistent, progressively worsening generalized weakness onset Jan 18th after receiving his first COVID vaccine.  Pt reports since that time he has had weakness, dizziness, nausea, vomiting, diarrhea, thirst, headache.  Pt reports development of SOB and cough in the last week.  Pt reports he has not had polyuria.  He denies fever, chills, neck pain, chest pain, abd pain, syncope, dysuria.  Pt reports taking tylenol for his symptoms with some relief.  He reports tonight he went to sit in a chair and it "slipped out" and he fell.  He denies hitting his head or LOC.  He reports at the time he had back pain, but this has resolved.  He lives at home with his wife.  He does not know what his blood sugar normally runs.   Discussed with Wife Brandon [name] who reports he has been more forgetful lately and intermittently confused in the last 3- 6 mos, but has remained fully independent.  She reports but he began vomiting yesterday.  Wife reports approx 4-5 episodes of vomiting.  She denies bloody or bilious emesis.  She reports he seemed a little "off" since the vaccine, but he didn't get sick until yesterday.  Wife reports only 1 fall which was today. She reports today he seemed very confused this afternoon after the fall.  She reports he never had slurred speech or focal deficit.  She did not witness it, but reports she heard him fall and he was still alert.  She reports his tremors are normal.     The history is provided by the patient and medical records. No language interpreter was used.       Past Medical History:  Diagnosis Date   Diabetes mellitus  without complication ([place])    Hypertension    Pacemaker    Stroke [facility])     There are no problems to display for this patient.   Past Surgical History:  Procedure Laterality Date   CARDIAC SURGERY         No family history on file.  Social History   Tobacco Use   Smoking status: Current Every Day Smoker   Smokeless tobacco: Never Used  Substance Use Topics   Alcohol use: Yes   Drug use: No    Home Medications Prior to Admission medications   Medication Sig Start Date End Date Taking? Authorizing Provider  acetaminophen (TYLENOL) 325 MG tablet Take 650 mg by mouth every 6 (six) hours as needed for moderate pain.    [provider]  aspirin 81 MG tablet Take 81 mg by mouth daily.    [provider]  clopidogrel (PLAVIX) 75 MG tablet Take 75 mg by mouth daily.    [provider]  dextromethorphan (DELSYM) 30 MG/5ML liquid Take 60 mg by mouth every 6 (six) hours as needed. Reported on 06/10/2015    [provider]  diphenhydrAMINE (BENADRYL) 25 MG tablet Take 25 mg by mouth every 6 (six) hours as needed. For cold symptoms    [provider]  insulin glargine (LANTUS) 100 UNIT/ML injection Inject 32 Units into the skin daily with breakfast.  [provider]  insulin lispro (HUMALOG) 100 UNIT/ML injection Inject 15 Units into the skin daily.     [provider]  isosorbide mononitrate (IMDUR) 30 MG 24 hr tablet Take 30 mg by mouth daily.    [provider]  latanoprost (XALATAN) 0.005 % ophthalmic solution 1 drop at bedtime.    [provider]  lisinopril (PRINIVIL,ZESTRIL) 5 MG tablet Take 5 mg by mouth daily.    [provider]  mycophenolate (CELLCEPT) 250 MG capsule Take 1,000 mg by mouth 2 (two) times daily. Reported on 08/10/2015    [provider]  niacin (NIASPAN) 500 MG CR tablet Take 1,000 mg by mouth at bedtime.    [provider]  pravastatin (PRAVACHOL)  40 MG tablet Take 40 mg by mouth daily.    [provider]  predniSONE (DELTASONE) 5 MG tablet Take 5 mg by mouth daily.    [provider]  propranolol (INDERAL) 10 MG tablet Take 10 mg by mouth 2 (two) times daily.    [provider]  ranitidine (ZANTAC) 150 MG tablet Take 150 mg by mouth daily. For acid reflux    [provider]  tacrolimus (PROGRAF) 1 MG capsule Take 2 mg by mouth 2 (two) times daily.    [provider]  temazepam (RESTORIL) 30 MG capsule Take 30 mg by mouth at bedtime as needed. For sleep    [provider]    Allergies    Amoxicillin and Codeine  Review of Systems   Review of Systems  Constitutional: Positive for fatigue. Negative for appetite change, diaphoresis, fever and unexpected weight change.  HENT: Negative for mouth sores.   Eyes: Negative for visual disturbance.  Respiratory: Positive for cough and shortness of breath. Negative for chest tightness and wheezing.   Cardiovascular: Negative for chest pain.  Gastrointestinal: Positive for diarrhea, nausea and vomiting. Negative for abdominal pain and constipation.  Endocrine: Positive for polydipsia. Negative for polyphagia and polyuria.  Genitourinary: Negative for dysuria, frequency, hematuria and urgency.  Musculoskeletal: Positive for back pain. Negative for neck stiffness.  Skin: Negative for rash.  Allergic/Immunologic: Negative for immunocompromised state.  Neurological: Positive for dizziness, weakness, light-headedness and headaches. Negative for syncope.  Hematological: Does not bruise/bleed easily.  Psychiatric/Behavioral: Negative for sleep disturbance. The patient is not nervous/anxious.     Physical Exam Updated Vital Signs BP (!) 156/86 (BP Location: Left Arm)    Pulse (!) 113    Temp 98.9 F (37.2 C) (Oral)    Resp 20    SpO2 97%   Physical Exam Vitals and nursing note reviewed.  Constitutional:      General: He is not in acute  distress.    Appearance: He is not diaphoretic.  HENT:     Head: Normocephalic.     Nose: Nose normal.     Mouth/Throat:     Mouth: Mucous membranes are dry.  Eyes:     General: No scleral icterus.    Conjunctiva/sclera: Conjunctivae normal.  Cardiovascular:     Rate and Rhythm: Regular rhythm. Tachycardia present.     Pulses: Normal pulses.          Radial pulses are 2+ on the right side and 2+ on the left side.  Pulmonary:     Effort: Tachypnea present. No accessory muscle usage, prolonged expiration, respiratory distress or retractions.     Breath sounds: Normal breath sounds. No stridor. No wheezing, rhonchi or rales.     Comments:  Equal chest rise. No increased work of breathing. Abdominal:     General: There is no distension.     Palpations: Abdomen is soft.     Tenderness: There is no abdominal tenderness. There is no right CVA tenderness, left CVA tenderness, guarding or rebound.  Musculoskeletal:     Cervical back: Normal range of motion. No deformity, tenderness or bony tenderness. No pain with movement.     Thoracic back: No deformity, tenderness or bony tenderness. Normal range of motion.     Lumbar back: No deformity, tenderness or bony tenderness. Normal range of motion.     Comments: Moves all extremities equally and without difficulty.  Skin:    General: Skin is warm and dry.     Capillary Refill: Capillary refill takes less than 2 seconds.  Neurological:     Mental Status: He is alert.     GCS: GCS eye subscore is 4. GCS verbal subscore is 5. GCS motor subscore is 6.     Comments: Speech is clear and goal oriented. Follows 2 step commands without difficulty.  Pt tremuolous  Psychiatric:        Mood and Affect: Mood normal.     ED Results / Procedures / Treatments   Labs (all labs ordered are listed, but only abnormal results are displayed) Labs Reviewed  CBC WITH DIFFERENTIAL/PLATELET - Abnormal; Notable for the following components:      Result Value    Platelets 98 (*)    Lymphs Abs 0.5 (*)    All other components within normal limits  COMPREHENSIVE METABOLIC PANEL - Abnormal; Notable for the following components:   Sodium 130 (*)    Chloride 95 (*)    CO2 20 (*)    Glucose, Bld 381 (*)    BUN 29 (*)    Creatinine, Ser 1.60 (*)    GFR calc non Af Amer 41 (*)    GFR calc Af Amer 47 (*)    All other components within normal limits  CBG MONITORING, ED - Abnormal; Notable for the following components:   Glucose-Capillary 347 (*)    All other components within normal limits  POC SARS CORONAVIRUS 2 AG -  ED - Abnormal; Notable for the following components:   SARS Coronavirus 2 Ag POSITIVE (*)    All other components within normal limits  CULTURE, BLOOD (ROUTINE X 2)  CULTURE, BLOOD (ROUTINE X 2)  URINE CULTURE  LACTIC ACID, PLASMA  APTT  PROTIME-INR  URINALYSIS, ROUTINE W REFLEX MICROSCOPIC  LACTIC ACID, PLASMA  BRAIN NATRIURETIC PEPTIDE    EKG EKG Interpretation  Date/Time:  Thursday May 30 2019 20:57:19 EST Ventricular Rate:  120 PR Interval:    QRS Duration: 86 QT Interval:  342 QTC Calculation: 483 R Axis:   66 Text Interpretation: Accelerated Junctional rhythm with frequent Premature ventricular complexes Possible Right ventricular hypertrophy Abnormal ECG No acute changes Nonspecific ST and T wave abnormality Confirmed by Varney Biles [id number]) on 05/30/2019 11:31:30 PM   Radiology DG Chest 1 View  Result Date: 05/30/2019 CLINICAL DATA:  Fall EXAM: CHEST  1 VIEW COMPARISON:  03/18/2012 FINDINGS: Stable chronic elevation of the left hemidiaphragm. Right pacer in place with leads in the right atrium and right ventricle. Postoperative changes on the left. Left basilar scarring. Calcified AP window and hilar lymph nodes compatible with old granulomatous disease. No acute bony abnormality. IMPRESSION: Chronic changes.  No acute cardiopulmonary disease. Electronically Signed   By: Rolm Baptise M.D.  On: 05/30/2019 23:20    DG Thoracic Spine 2 View  Result Date: 05/30/2019 CLINICAL DATA:  Pain after fall EXAM: THORACIC SPINE 2 VIEWS COMPARISON:  None. FINDINGS: Degenerative changes. No fracture or focal bone lesion. Aortic atherosclerosis. Prior median sternotomy. IMPRESSION: No acute bony abnormality. Electronically Signed   By: Rolm Baptise M.D.   On: 05/30/2019 23:14   DG Lumbar Spine Complete  Result Date: 05/30/2019 CLINICAL DATA:  Pain after fall EXAM: LUMBAR SPINE - COMPLETE 4+ VIEW COMPARISON:  None FINDINGS: Normal alignment. No fracture. Early degenerative spurring. SI joints symmetric and unremarkable. Diffuse aortic atherosclerosis. No visible aneurysm. IMPRESSION: No acute bony abnormality. Electronically Signed   By: Rolm Baptise M.D.   On: 05/30/2019 23:15   CT Head Wo Contrast  Result Date: 05/31/2019 CLINICAL DATA:  Fall EXAM: CT HEAD WITHOUT CONTRAST TECHNIQUE: Contiguous axial images were obtained from the base of the skull through the vertex without intravenous contrast. COMPARISON:  03/19/2012 FINDINGS: Brain: Old right posterior frontal infarct, stable. There is atrophy and chronic small vessel disease changes. No acute intracranial abnormality. Specifically, no hemorrhage, hydrocephalus, mass lesion, acute infarction, or significant intracranial injury. Vascular: No hyperdense vessel or unexpected calcification. Skull: No acute calvarial abnormality. Sinuses/Orbits: Visualized paranasal sinuses and mastoids clear. Orbital soft tissues unremarkable. Other: None IMPRESSION: Old right frontal infarct. Atrophy, chronic microvascular disease. No acute intracranial abnormality. Electronically Signed   By: Rolm Baptise M.D.   On: 05/31/2019 00:02   CT Cervical Spine Wo Contrast  Result Date: 05/31/2019 CLINICAL DATA:  Fall EXAM: CT CERVICAL SPINE WITHOUT CONTRAST TECHNIQUE: Multidetector CT imaging of the cervical spine was performed without intravenous contrast. Multiplanar CT image reconstructions  were also generated. COMPARISON:  None. FINDINGS: Alignment: No subluxation Skull base and vertebrae: No acute fracture. No primary bone lesion or focal pathologic process. Soft tissues and spinal canal: No prevertebral fluid or swelling. No visible canal hematoma. Disc levels:  Diffuse degenerative disc and facet disease. Upper chest: No acute findings Other: Carotid artery calcifications. IMPRESSION: No acute bony abnormality. Electronically Signed   By: Rolm Baptise M.D.   On: 05/31/2019 00:03    Procedures Procedures (including critical care time)  Medications Ordered in ED Medications  sodium chloride 0.9 % bolus 500 mL (500 mLs Intravenous New Bag/Given 05/31/19 0044)    ED Course  I have reviewed the triage vital signs and the nursing notes.  Pertinent labs & imaging results that were available during my care of the patient were reviewed by me and considered in my medical decision making (see chart for details).  Clinical Course as of May 30 57  Thu May 30, 2019  2304 Elevated above baseline  Creatinine(!): 1.60 [HM]  2304 hyponatremia  Sodium(!): 130 [HM]  2305 Hyperglycemia noted  Glucose(!): 381 [HM]  2305 No leukocytosis  WBC: 4.0 [HM]  2305 tachycardia  Pulse Rate(!): 110 [HM]  Fri May 31, 2019  0055 Discussed with Dr. Flossie Buffy who will admit   [HM]    Clinical Course User Index [HM] Jamine Highfill, Gwenlyn Perking   MDM Rules/Calculators/A&P                       Brandon [name] was evaluated in Emergency Department on 05/31/2019 for the symptoms described in the history of present illness. He was evaluated in the context of the global COVID-19 pandemic, which necessitated consideration that the patient might be at risk for infection with the SARS-CoV-2 virus that causes COVID-19.  Institutional protocols and algorithms that pertain to the evaluation of patients at risk for COVID-19 are in a state of rapid change based on information released by regulatory bodies including the  CDC and federal and state organizations. These policies and algorithms were followed during the patient's care in the ED.   Patient presents with confusion, weakness, fall and COVID-19.  He is tachycardic and tachypneic on evaluation.  He is not hypoxic or febrile at this time.  Patient is high risk with a history of heart transplant and diabetes.  No leukocytosis noted.  He does have a mild AKI and very dry mucous membranes.  Fluid bolus given.  Given fall and confusion CT head and neck were obtained.  No acute abnormality and no evidence of intracranial hemorrhage.  Plain films of the thoracic and lumbar spine are without evidence of compression fracture.  Chest x-ray without evidence of pneumonia.  Discussed with [facility] hospitalist who will admit.  The patient was discussed with and seen by Dr. Kathrynn Humble who agrees with the treatment plan.  Final Clinical Impression(s) / ED Diagnoses Final diagnoses:  COVID-19 virus infection  Fall, initial encounter  Tachycardia  AKI (acute kidney injury) [facility])    Rx / [place] Orders ED Discharge Orders    None       Niah Heinle, Gwenlyn Perking 05/31/19 [place], Ankit, MD 05/31/19 [phone]

## 2019-05-30 NOTE — ED Notes (Signed)
Sheilah Pigeon wife [id number] looking for an update on the patient

## 2019-05-30 NOTE — ED Triage Notes (Signed)
Patient arrived with EMS from home fell this evening found on the floor by family , patient stated he lost his balance denies injury or pain , CBG= 323, patient added emesis onset yesterday with generalized weakness/fatigue , alert and oriented at triage .

## 2019-05-31 DIAGNOSIS — E1122 Type 2 diabetes mellitus with diabetic chronic kidney disease: Secondary | ICD-10-CM

## 2019-05-31 DIAGNOSIS — U071 COVID-19: Secondary | ICD-10-CM

## 2019-05-31 DIAGNOSIS — Z941 Heart transplant status: Secondary | ICD-10-CM

## 2019-05-31 DIAGNOSIS — I2510 Atherosclerotic heart disease of native coronary artery without angina pectoris: Secondary | ICD-10-CM

## 2019-05-31 DIAGNOSIS — W19XXXA Unspecified fall, initial encounter: Secondary | ICD-10-CM | POA: Diagnosis not present

## 2019-05-31 DIAGNOSIS — N1831 Chronic kidney disease, stage 3a: Secondary | ICD-10-CM | POA: Diagnosis not present

## 2019-05-31 DIAGNOSIS — D696 Thrombocytopenia, unspecified: Secondary | ICD-10-CM

## 2019-05-31 DIAGNOSIS — Z950 Presence of cardiac pacemaker: Secondary | ICD-10-CM

## 2019-05-31 DIAGNOSIS — I10 Essential (primary) hypertension: Secondary | ICD-10-CM

## 2019-05-31 DIAGNOSIS — I25811 Atherosclerosis of native coronary artery of transplanted heart without angina pectoris: Secondary | ICD-10-CM

## 2019-05-31 DIAGNOSIS — B460 Pulmonary mucormycosis: Secondary | ICD-10-CM

## 2019-05-31 DIAGNOSIS — N183 Chronic kidney disease, stage 3 unspecified: Secondary | ICD-10-CM

## 2019-05-31 DIAGNOSIS — A419 Sepsis, unspecified organism: Secondary | ICD-10-CM

## 2019-05-31 LAB — URINALYSIS, ROUTINE W REFLEX MICROSCOPIC
Bilirubin Urine: NEGATIVE
Glucose, UA: >=500 mg/dL — AB
Hgb urine dipstick: NEGATIVE
Ketones, ur: 20 mg/dL — AB
Leukocytes,Ua: NEGATIVE
Nitrite: NEGATIVE
Protein, ur: 100 mg/dL — AB
Specific Gravity, Urine: 1.018 (ref 1.005–1.030)
pH: 5 (ref 5.0–8.0)

## 2019-05-31 LAB — POC SARS CORONAVIRUS 2 AG -  ED: SARS Coronavirus 2 Ag: POSITIVE — AB

## 2019-05-31 LAB — GLUCOSE, CAPILLARY
Glucose-Capillary: 329 mg/dL — ABNORMAL HIGH (ref 70–99)
Glucose-Capillary: 155 mg/dL — ABNORMAL HIGH (ref 70–99)
Glucose-Capillary: 271 mg/dL — ABNORMAL HIGH (ref 70–99)
Glucose-Capillary: 84 mg/dL (ref 70–99)

## 2019-05-31 LAB — ABO/RH: ABO/RH(D): A POS

## 2019-05-31 LAB — APTT: aPTT: 24 seconds (ref 24–36)

## 2019-05-31 LAB — PROTIME-INR
INR: 1 (ref 0.8–1.2)
Prothrombin Time: 12.8 seconds (ref 11.4–15.2)

## 2019-05-31 LAB — HEMOGLOBIN A1C
Hgb A1c MFr Bld: 8.3 % — ABNORMAL HIGH (ref 4.8–5.6)
Mean Plasma Glucose: 191.51 mg/dL

## 2019-05-31 LAB — CBG MONITORING, ED: Glucose-Capillary: 347 mg/dL — ABNORMAL HIGH (ref 70–99)

## 2019-05-31 LAB — LACTIC ACID, PLASMA
Lactic Acid, Venous: 1.5 mmol/L (ref 0.5–1.9)
Lactic Acid, Venous: 2.2 mmol/L (ref 0.5–1.9)

## 2019-05-31 MED ORDER — INSULIN ASPART 100 UNIT/ML ~~LOC~~ SOLN
0.0000 [IU] | Freq: Three times a day (TID) | SUBCUTANEOUS | Status: DC
  Administered 2019-05-31: 11 [IU] via SUBCUTANEOUS
  Administered 2019-05-31: 4 [IU] via SUBCUTANEOUS
  Administered 2019-05-31: 15 [IU] via SUBCUTANEOUS
  Administered 2019-06-01: 20 [IU] via SUBCUTANEOUS

## 2019-05-31 MED ORDER — CLOPIDOGREL BISULFATE 75 MG PO TABS: 75.0000 mg | ORAL_TABLET | Freq: Every day | ORAL | Status: DC

## 2019-05-31 MED ORDER — INSULIN GLARGINE 100 UNIT/ML ~~LOC~~ SOLN
15.0000 [IU] | Freq: Every day | SUBCUTANEOUS | Status: DC
  Administered 2019-05-31: 15 [IU] via SUBCUTANEOUS
  Filled 2019-05-31 (×3): qty 0.15

## 2019-05-31 MED ORDER — SODIUM CHLORIDE 0.9 % IV BOLUS
500.0000 mL | Freq: Once | INTRAVENOUS | Status: AC
  Administered 2019-05-31: 500 mL via INTRAVENOUS

## 2019-05-31 MED ORDER — PREDNISONE 5 MG PO TABS
5.0000 mg | ORAL_TABLET | Freq: Every day | ORAL | Status: DC
  Administered 2019-05-31 – 2019-06-01 (×2): 5 mg via ORAL
  Filled 2019-05-31 (×2): qty 1

## 2019-05-31 MED ORDER — INSULIN ASPART 100 UNIT/ML ~~LOC~~ SOLN
10.0000 [IU] | Freq: Three times a day (TID) | SUBCUTANEOUS | Status: DC
  Administered 2019-05-31 (×2): 10 [IU] via SUBCUTANEOUS

## 2019-05-31 MED ORDER — ENOXAPARIN SODIUM 40 MG/0.4ML ~~LOC~~ SOLN
40.0000 mg | Freq: Every day | SUBCUTANEOUS | Status: DC
  Administered 2019-05-31 – 2019-06-01 (×2): 40 mg via SUBCUTANEOUS
  Filled 2019-05-31 (×2): qty 0.4

## 2019-05-31 MED ORDER — TACROLIMUS 1 MG PO CAPS
1.0000 mg | ORAL_CAPSULE | Freq: Two times a day (BID) | ORAL | Status: DC
  Administered 2019-05-31 – 2019-06-01 (×3): 1 mg via ORAL
  Filled 2019-05-31 (×3): qty 1

## 2019-05-31 NOTE — ED Notes (Signed)
Admitting physician in with patient at this time

## 2019-05-31 NOTE — ED Notes (Signed)
Unable to draw blood

## 2019-05-31 NOTE — H&P (Signed)
History and Physical    Brandon [name] [id number] DOB: 1941/03/17 DOA: 05/30/2019  PCP: Leanna Battles, MD  Patient coming from: Home, lives with wife   I have personally briefly reviewed patient's old medical records in [place]  Chief Complaint: fall   HPI: Brandon [name] is a 79 y.o. 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  He was sitting in his kitchen chair and it slid out from under him when he was trying to get up. He denies any loss of consciousness. No lightheadedness or dizziness.  He received the first dose of his COVID vaccine on 1/15 and since then he has felt nauseous and had an episode of vomiting. Also had diarrhea twice. Has decrease PO intake and feels weak. Has mild cough, runny nose and sore throat. No fever. No sick contact. No known COVID contact.  ED Course: He was afebrile, hypertensive, tachycardic and tachypneic on room air.  CBC showed no leukocytosis, platelet down to 98.  Na of 130, Glucose of 381, creatinine at 1.60 Lactate of 1.6  CT head, CT cervical spine, lumbar/thoracic X-ray and CXR were all negative.   POC COVID test positive.   Review of Systems:  Constitutional: No Weight Change, No Fever ENT/Mouth: + sore throat, + Rhinorrhea Eyes:  No Vision Changes Cardiovascular: No Chest Pain, no SOB Respiratory: + Cough, No Sputum Gastrointestinal: +Nausea, +Vomiting, + Diarrhea, No Constipation, No Pain Genitourinary: no Urinary Incontinence Musculoskeletal: No Arthralgias, No Myalgias Skin: No Skin Lesions, No Pruritus, Neuro: +Weakness, No Numbness,  No Loss of Consciousness, No Syncope Psych: No Anxiety/Panic, No Depression, no decrease appetite Heme/Lymph: No Bruising, No Bleeding  Past Medical History:    Diagnosis Date  . Diabetes mellitus without complication ([place])   . Hypertension   . Pacemaker   . Stroke [facility])     Past Surgical History:  Procedure Laterality Date  . CARDIAC SURGERY       reports that he has been smoking. He has never used smokeless tobacco. He reports current alcohol use. He reports that he does not use drugs.  Allergies  Allergen Reactions  . Amoxicillin Swelling and Rash    Had to come to hospital after taking it.  . Codeine Nausea And Vomiting      Prior to Admission medications   Medication Sig Start Date End Date Taking? Authorizing Provider  acetaminophen (TYLENOL) 325 MG tablet Take 650 mg by mouth every 6 (six) hours as needed for moderate pain.    [provider]  aspirin 81 MG tablet Take 81 mg by mouth daily.    [provider]  clopidogrel (PLAVIX) 75 MG tablet Take 75 mg by mouth daily.    [provider]  dextromethorphan (DELSYM) 30 MG/5ML liquid Take 60 mg by mouth every 6 (six) hours as needed. Reported on 06/10/2015    [provider]  diphenhydrAMINE (BENADRYL) 25 MG tablet Take 25 mg by mouth every 6 (six) hours as needed. For cold symptoms    [provider]  insulin glargine (LANTUS) 100 UNIT/ML injection Inject 32 Units into the skin daily with breakfast.     [provider]  insulin lispro (HUMALOG) 100 UNIT/ML injection Inject 15 Units into the skin daily.     [provider]  isosorbide mononitrate (IMDUR) 30 MG 24 hr tablet Take 30 mg by mouth daily.    [provider]  latanoprost (XALATAN) 0.005 % ophthalmic solution 1 drop at bedtime.    [provider]  lisinopril (PRINIVIL,ZESTRIL) 5 MG tablet Take 5 mg by mouth daily.    [provider]  mycophenolate (CELLCEPT) 250 MG capsule Take 1,000 mg by mouth 2 (two) times daily. Reported on 08/10/2015    [provider]  niacin (NIASPAN) 500 MG CR tablet Take 1,000 mg by mouth at bedtime.     [provider]  pravastatin (PRAVACHOL) 40 MG tablet Take 40 mg by mouth daily.    [provider]  predniSONE (DELTASONE) 5 MG tablet Take 5 mg by mouth daily.    [provider]  propranolol (INDERAL) 10 MG tablet Take 10 mg by mouth 2 (two) times daily.    [provider]  ranitidine (ZANTAC) 150 MG tablet Take 150 mg by mouth daily. For acid reflux    [provider]  tacrolimus (PROGRAF) 1 MG capsule Take 2 mg by mouth 2 (two) times daily.    [provider]  temazepam (RESTORIL) 30 MG capsule Take 30 mg by mouth at bedtime as needed. For sleep    [provider]    Physical Exam: Vitals:   05/30/19 2056 05/30/19 2215 05/30/19 2245 05/31/19 0045  BP: (!) 156/86 (!) 146/94 (!) 157/96 (!) 145/98  Pulse: (!) 113 (!) 110 (!) 109 (!) 107  Resp: 20 (!) 23 (!) 24 (!) 30  Temp: 98.9 F (37.2 C)     TempSrc: Oral     SpO2: 97% 96% 97% 96%    Constitutional: diaphoretic mildly ill appearing male laying in bed Vitals:   05/30/19 2056 05/30/19 2215 05/30/19 2245 05/31/19 0045  BP: (!) 156/86 (!) 146/94 (!) 157/96 (!) 145/98  Pulse: (!) 113 (!) 110 (!) 109 (!) 107  Resp: 20 (!) 23 (!) 24 (!) 30  Temp: 98.9 F (37.2 C)     TempSrc: Oral     SpO2: 97% 96% 97% 96%   Eyes: PERRL, lids and conjunctivae normal ENMT: Mucous membranes are moist.  Neck: normal, supple Respiratory: clear to auscultation bilaterally, no wheezing, no crackles.  Mild tachypnea on room air with O2 saturation at 95%.   Cardiovascular: Tachycardia with normal rhythm, no murmurs / rubs / gallops. No extremity edema. .  Abdomen: no tenderness, no masses palpated.  Bowel sounds positive.  Musculoskeletal: no clubbing / cyanosis. No joint deformity upper and lower extremities. Good ROM, no contractures. Normal muscle tone.  Skin: no rashes, lesions, ulcers. No induration.  Neurologic: CN 2-12 grossly intact. Sensation intact. Strength 4/5 in all 4.    Psychiatric: Normal judgment and insight. Alert and oriented x 3. Normal mood.     Labs on Admission: I have personally reviewed following labs and imaging studies  CBC: Recent Labs  Lab 05/30/19 2116  WBC 4.0  NEUTROABS 2.8  HGB 14.7  HCT 44.6  MCV 94.5  PLT 98*   Basic Metabolic Panel: Recent Labs  Lab 05/30/19 2116  NA 130*  K 4.3  CL 95*  CO2 20*  GLUCOSE 381*  BUN 29*  CREATININE 1.60*  CALCIUM 9.0   GFR: CrCl cannot be calculated (Unknown ideal weight.). Liver Function Tests: Recent Labs  Lab 05/30/19 2116  AST 20  ALT 24  ALKPHOS 68  BILITOT 1.1  PROT 6.8  ALBUMIN 4.2   No results  for input(s): LIPASE, AMYLASE in the last 168 hours. No results for input(s): AMMONIA in the last 168 hours. Coagulation Profile: Recent Labs  Lab 05/30/19 2348  INR 1.0   Cardiac Enzymes: No results for input(s): CKTOTAL, CKMB, CKMBINDEX, TROPONINI in the last 168 hours. BNP (last 3 results) No results for input(s): PROBNP in the last 8760 hours. HbA1C: No results for input(s): HGBA1C in the last 72 hours. CBG: Recent Labs  Lab 05/31/19 0003  GLUCAP 347*   Lipid Profile: No results for input(s): CHOL, HDL, LDLCALC, TRIG, CHOLHDL, LDLDIRECT in the last 72 hours. Thyroid Function Tests: No results for input(s): TSH, T4TOTAL, FREET4, T3FREE, THYROIDAB in the last 72 hours. Anemia Panel: No results for input(s): VITAMINB12, FOLATE, FERRITIN, TIBC, IRON, RETICCTPCT in the last 72 hours. Urine analysis: No results found for: COLORURINE, APPEARANCEUR, LABSPEC, Junior, GLUCOSEU, HGBUR, BILIRUBINUR, KETONESUR, PROTEINUR, UROBILINOGEN, NITRITE, LEUKOCYTESUR  Radiological Exams on Admission: DG Chest 1 View  Result Date: 05/30/2019 CLINICAL DATA:  Fall EXAM: CHEST  1 VIEW COMPARISON:  03/18/2012 FINDINGS: Stable chronic elevation of the left hemidiaphragm. Right pacer in place with leads in the right atrium and right ventricle. Postoperative changes on the left.  Left basilar scarring. Calcified AP window and hilar lymph nodes compatible with old granulomatous disease. No acute bony abnormality. IMPRESSION: Chronic changes.  No acute cardiopulmonary disease. Electronically Signed   By: Rolm Baptise M.D.   On: 05/30/2019 23:20   DG Thoracic Spine 2 View  Result Date: 05/30/2019 CLINICAL DATA:  Pain after fall EXAM: THORACIC SPINE 2 VIEWS COMPARISON:  None. FINDINGS: Degenerative changes. No fracture or focal bone lesion. Aortic atherosclerosis. Prior median sternotomy. IMPRESSION: No acute bony abnormality. Electronically Signed   By: Rolm Baptise M.D.   On: 05/30/2019 23:14   DG Lumbar Spine Complete  Result Date: 05/30/2019 CLINICAL DATA:  Pain after fall EXAM: LUMBAR SPINE - COMPLETE 4+ VIEW COMPARISON:  None FINDINGS: Normal alignment. No fracture. Early degenerative spurring. SI joints symmetric and unremarkable. Diffuse aortic atherosclerosis. No visible aneurysm. IMPRESSION: No acute bony abnormality. Electronically Signed   By: Rolm Baptise M.D.   On: 05/30/2019 23:15   CT Head Wo Contrast  Result Date: 05/31/2019 CLINICAL DATA:  Fall EXAM: CT HEAD WITHOUT CONTRAST TECHNIQUE: Contiguous axial images were obtained from the base of the skull through the vertex without intravenous contrast. COMPARISON:  03/19/2012 FINDINGS: Brain: Old right posterior frontal infarct, stable. There is atrophy and chronic small vessel disease changes. No acute intracranial abnormality. Specifically, no hemorrhage, hydrocephalus, mass lesion, acute infarction, or significant intracranial injury. Vascular: No hyperdense vessel or unexpected calcification. Skull: No acute calvarial abnormality. Sinuses/Orbits: Visualized paranasal sinuses and mastoids clear. Orbital soft tissues unremarkable. Other: None IMPRESSION: Old right frontal infarct. Atrophy, chronic microvascular disease. No acute intracranial abnormality. Electronically Signed   By: Rolm Baptise M.D.   On: 05/31/2019  00:02   CT Cervical Spine Wo Contrast  Result Date: 05/31/2019 CLINICAL DATA:  Fall EXAM: CT CERVICAL SPINE WITHOUT CONTRAST TECHNIQUE: Multidetector CT imaging of the cervical spine was performed without intravenous contrast. Multiplanar CT image reconstructions were also generated. COMPARISON:  None. FINDINGS: Alignment: No subluxation Skull base and vertebrae: No acute fracture. No primary bone lesion or focal pathologic process. Soft tissues and spinal canal: No prevertebral fluid or swelling. No visible canal hematoma. Disc levels:  Diffuse degenerative disc and facet disease. Upper chest: No acute findings Other: Carotid artery calcifications. IMPRESSION: No acute bony abnormality. Electronically Signed   By: Lennette Bihari  [place] M.D.   On: 05/31/2019 00:03    EKG: Independently reviewed.   Assessment/Plan  Fall likely secondary to weakness from COVID infection Negative CT head, spine, L/T spine Xray.  Has weakness at baseline and ambulates with cane. PT eval  Sepsis secondary to COVID infection GI symptoms predominate. Not hypoxic- continuous pulse ox monitoring   Thrombocytopenia Could be due to immunosuppression and COVID infection Plt of 98 - no active bleed  Heart transplant  Immunosuppression regimen: tacrolimus, prednisone, MMF Tacrolimus trough goal :5-8 while on 3 agents with CKD per [facility] cardiology Will need to consult with [place] cardiology in the morning regarding whether to resume immunosuppressive given COVID infection. There has been limited case study where immunosuppressives were reduced or held.  Will continue prednisone for now  CAD Hold plavix due to thrombocytopenia  Hypertension Resume antihypertensives after med rec  CKD stage 3a Creatinine of 1.6 on admit.  baseline around 1.4  Insulin dependent type 2 diabetes  BG of 381  Start with 15U Lantus qHS and 10 units TID with meals   pulmonary mucormycosis s/p left lower lobectomy No hypoxia  DVT  prophylaxis:.Lovenox Code Status: Full Family Communication: Plan discussed with patient at bedside  disposition Plan: Home with at least 2 midnight stays  Consults called:  Admission status: inpatient  Donnovan Stamour T Olena Willy DO [facility]   If 7PM-7AM, please contact night-coverage [email or web address]   05/31/2019, 1:15 AM

## 2019-05-31 NOTE — Progress Notes (Signed)
RUA pt has glucometer

## 2019-05-31 NOTE — ED Notes (Signed)
Reported poc covid test postive to [facility]

## 2019-05-31 NOTE — ED Notes (Signed)
CRITICAL LAB RESULT: Lactic Acid 2.2 Dr. Ileene Musa notified.

## 2019-05-31 NOTE — Evaluation (Signed)
Physical Therapy Evaluation Patient Details Name: Brandon [name] MRN: [id number] DOB: [birth year] Today's Date: 05/31/2019   History of Present Illness   Brandon [name] is a 79 y.o. 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   Positive for Covid.   Clinical Impression  Pt admitted with above diagnosis. Pt was able to ambulate in room and is steadier with RW.  Will benefit from using RW at home for stabiltiy and pt agrees.  Ptshould be able to go home with HHPT.  Pt currently with functional limitations due to the deficits listed below (see PT Problem List). Pt will benefit from skilled PT to increase their independence and safety with mobility to allow discharge to the venue listed below.      Follow Up Recommendations Home health PT;Supervision/Assistance - 24 hour    Equipment Recommendations  Rolling walker with 5" wheels    Recommendations for Other Services       Precautions / Restrictions Precautions Precautions: Fall Restrictions Weight Bearing Restrictions: No      Mobility  Bed Mobility Overal bed mobility: Independent             General bed mobility comments: was sitting on EOB on arrival. Needed to use urinal.  Transfers Overall transfer level: Needs assistance Equipment used: None Transfers: Sit to/from Stand Sit to Stand: Min guard         General transfer comment: Pt needed assist to steady to stand and could stand and use urinal with min guard assist.   Ambulation/Gait Ambulation/Gait assistance: Min assist Gait Distance (Feet): 70 Feet Assistive device: None;Rolling walker (2 wheeled) Gait Pattern/deviations: Step-through pattern;Decreased stride length;Wide base of support;Antalgic;Drifts right/left   Gait velocity  interpretation: <1.31 ft/sec, indicative of household ambulator General Gait Details: Pt slightly unsteady with gait.  Did better with RW for support.   Stairs            Wheelchair Mobility    Modified Rankin (Stroke Patients Only)       Balance Overall balance assessment: Needs assistance Sitting-balance support: No upper extremity supported;Feet supported Sitting balance-Leahy Scale: Fair     Standing balance support: Bilateral upper extremity supported;During functional activity Standing balance-Leahy Scale: Poor Standing balance comment: relies on UE support and RW for support                             Pertinent Vitals/Pain Pain Assessment: No/denies pain    Home Living Family/patient expects to be discharged to:: Private residence Living Arrangements: Spouse/significant other Available Help at Discharge: Family;Available 24 hours/day Type of Home: House Home Access: Level entry     Home Layout: One level Home Equipment: Cane - single point      Prior Function Level of Independence: Independent               Hand Dominance   Dominant Hand: Left    Extremity/Trunk Assessment   Upper Extremity Assessment Upper Extremity Assessment: Defer to OT evaluation    Lower Extremity Assessment Lower Extremity Assessment: Generalized weakness    Cervical / Trunk Assessment Cervical / Trunk Assessment: Normal  Communication   Communication: No difficulties  Cognition Arousal/Alertness: Awake/alert Behavior During Therapy: WFL for tasks assessed/performed Overall Cognitive Status: Within Functional  Limits for tasks assessed                                        General Comments      Exercises     Assessment/Plan    PT Assessment Patient needs continued PT services  PT Problem List Decreased balance;Decreased mobility;Decreased activity tolerance;Decreased knowledge of use of DME;Decreased safety  awareness;Decreased knowledge of precautions;Cardiopulmonary status limiting activity       PT Treatment Interventions DME instruction;Gait training;Functional mobility training;Therapeutic activities;Therapeutic exercise;Balance training;Patient/family education    PT Goals (Current goals can be found in the Care Plan section)  Acute Rehab PT Goals Patient Stated Goal: to go home PT Goal Formulation: With patient Time For Goal Achievement: 06/14/19 Potential to Achieve Goals: Good    Frequency Min 3X/week   Barriers to discharge        Co-evaluation               AM-PAC PT "6 Clicks" Mobility  Outcome Measure Help needed turning from your back to your side while in a flat bed without using bedrails?: None Help needed moving from lying on your back to sitting on the side of a flat bed without using bedrails?: None Help needed moving to and from a bed to a chair (including a wheelchair)?: A Little Help needed standing up from a chair using your arms (e.g., wheelchair or bedside chair)?: A Little Help needed to walk in hospital room?: A Little Help needed climbing 3-5 steps with a railing? : A Little 6 Click Score: 20    End of Session Equipment Utilized During Treatment: Gait belt Activity Tolerance: Patient limited by fatigue Patient left: in chair;with call bell/phone within reach Nurse Communication: Mobility status PT Visit Diagnosis: Unsteadiness on feet (R26.81);Muscle weakness (generalized) (M62.81)    Time: [id number] PT Time Calculation (min) (ACUTE ONLY): 14 min   Charges:   PT Evaluation $PT Eval Moderate Complexity: 1 Mod          Shir Bergman W,PT Acute Rehabilitation Services Pager:  [phone]  Office:  [place] 05/31/2019, 4:16 PM

## 2019-05-31 NOTE — Progress Notes (Addendum)
Patient ID: Brandon [name], male   DOB: [birth year], 79 y.o.   MRN: [id number] Patient was admitted early this morning for fall at home without loss of consciousness/lightheadedness or dizziness.  He tested positive for Covid.  I have reviewed patient medical records including this morning's H&P, labs, vitals, medications myself.  Patient seen and examined at bedside and plan of care discussed with him.  PT eval.   Spoke with [facility] transplant on phone who recommended to continue prednisone and but hold CellCept for 2 weeks.

## 2019-06-01 DIAGNOSIS — A4189 Other specified sepsis: Secondary | ICD-10-CM | POA: Diagnosis not present

## 2019-06-01 DIAGNOSIS — Z794 Long term (current) use of insulin: Secondary | ICD-10-CM

## 2019-06-01 DIAGNOSIS — I10 Essential (primary) hypertension: Secondary | ICD-10-CM | POA: Diagnosis not present

## 2019-06-01 DIAGNOSIS — B460 Pulmonary mucormycosis: Secondary | ICD-10-CM

## 2019-06-01 DIAGNOSIS — N1831 Chronic kidney disease, stage 3a: Secondary | ICD-10-CM

## 2019-06-01 DIAGNOSIS — W19XXXA Unspecified fall, initial encounter: Secondary | ICD-10-CM | POA: Diagnosis not present

## 2019-06-01 DIAGNOSIS — E1121 Type 2 diabetes mellitus with diabetic nephropathy: Secondary | ICD-10-CM

## 2019-06-01 DIAGNOSIS — A419 Sepsis, unspecified organism: Secondary | ICD-10-CM | POA: Diagnosis not present

## 2019-06-01 DIAGNOSIS — Z941 Heart transplant status: Secondary | ICD-10-CM | POA: Diagnosis not present

## 2019-06-01 LAB — CBC WITH DIFFERENTIAL/PLATELET
Abs Immature Granulocytes: 0.01 10*3/uL (ref 0.00–0.07)
Basophils Absolute: 0 10*3/uL (ref 0.0–0.1)
Basophils Relative: 0 %
Eosinophils Absolute: 0 10*3/uL (ref 0.0–0.5)
Eosinophils Relative: 1 %
HCT: 44.6 % (ref 39.0–52.0)
Hemoglobin: 14.5 g/dL (ref 13.0–17.0)
Immature Granulocytes: 0 %
Lymphocytes Relative: 17 %
Lymphs Abs: 0.6 10*3/uL — ABNORMAL LOW (ref 0.7–4.0)
MCH: 30.5 pg (ref 26.0–34.0)
MCHC: 32.5 g/dL (ref 30.0–36.0)
MCV: 93.9 fL (ref 80.0–100.0)
Monocytes Absolute: 0.4 10*3/uL (ref 0.1–1.0)
Monocytes Relative: 13 %
Neutro Abs: 2.3 10*3/uL (ref 1.7–7.7)
Neutrophils Relative %: 69 %
Platelets: 86 10*3/uL — ABNORMAL LOW (ref 150–400)
RBC: 4.75 MIL/uL (ref 4.22–5.81)
RDW: 12.4 % (ref 11.5–15.5)
WBC: 3.3 10*3/uL — ABNORMAL LOW (ref 4.0–10.5)
nRBC: 0 % (ref 0.0–0.2)

## 2019-06-01 LAB — URINE CULTURE: Culture: 20000 — AB

## 2019-06-01 LAB — COMPREHENSIVE METABOLIC PANEL
ALT: 22 U/L (ref 0–44)
AST: 24 U/L (ref 15–41)
Albumin: 3.6 g/dL (ref 3.5–5.0)
Alkaline Phosphatase: 60 U/L (ref 38–126)
Anion gap: 13 (ref 5–15)
BUN: 33 mg/dL — ABNORMAL HIGH (ref 8–23)
CO2: 20 mmol/L — ABNORMAL LOW (ref 22–32)
Calcium: 8.5 mg/dL — ABNORMAL LOW (ref 8.9–10.3)
Chloride: 97 mmol/L — ABNORMAL LOW (ref 98–111)
Creatinine, Ser: 1.68 mg/dL — ABNORMAL HIGH (ref 0.61–1.24)
GFR calc Af Amer: 45 mL/min — ABNORMAL LOW (ref >60)
GFR calc non Af Amer: 39 mL/min — ABNORMAL LOW (ref >60)
Glucose, Bld: 284 mg/dL — ABNORMAL HIGH (ref 70–99)
Potassium: 4.2 mmol/L (ref 3.5–5.1)
Sodium: 130 mmol/L — ABNORMAL LOW (ref 135–145)
Total Bilirubin: 1.1 mg/dL (ref 0.3–1.2)
Total Protein: 6.2 g/dL — ABNORMAL LOW (ref 6.5–8.1)

## 2019-06-01 LAB — GLUCOSE, CAPILLARY
Glucose-Capillary: 348 mg/dL — ABNORMAL HIGH (ref 70–99)
Glucose-Capillary: 245 mg/dL — ABNORMAL HIGH (ref 70–99)

## 2019-06-01 LAB — C-REACTIVE PROTEIN: CRP: 7.6 mg/dL — ABNORMAL HIGH (ref <1.0)

## 2019-06-01 LAB — FERRITIN: Ferritin: 436 ng/mL — ABNORMAL HIGH (ref 24–336)

## 2019-06-01 LAB — MAGNESIUM: Magnesium: 1.7 mg/dL (ref 1.7–2.4)

## 2019-06-01 MED ORDER — PROPRANOLOL HCL 10 MG PO TABS
10.0000 mg | ORAL_TABLET | ORAL | Status: DC
  Filled 2019-06-01 (×3): qty 1

## 2019-06-01 MED ORDER — ISOSORBIDE MONONITRATE ER 30 MG PO TB24
30.0000 mg | ORAL_TABLET | Freq: Every day | ORAL | Status: DC
  Administered 2019-06-01: 30 mg via ORAL
  Filled 2019-06-01: qty 1

## 2019-06-01 MED ORDER — MYCOPHENOLATE MOFETIL 500 MG PO TABS: 1000.0000 mg | ORAL_TABLET | Freq: Two times a day (BID) | ORAL | Status: AC

## 2019-06-01 MED ORDER — ONDANSETRON HCL 4 MG/2ML IJ SOLN: 4.0000 mg | Freq: Four times a day (QID) | INTRAMUSCULAR | Status: DC | PRN

## 2019-06-01 NOTE — TOC Transition Note (Signed)
Transition of Care [facility]) - CM/SW Discharge Note   Patient Details  Name: Brandon [name] MRN: [id number] Date of Birth: 1940-11-11  [facility] CM/SW Contact:  Carles Collet, RN Phone Number: 06/01/2019, 10:31 AM   Clinical Narrative:    RW to be delivered to room. Wife is able to provide transport home. HH PT referral placed to [facility], waiting to hear back     Final next level of care: [place] Barriers to Discharge: No Barriers Identified   Patient Goals and CMS Choice Patient states their goals for this hospitalization and ongoing recovery are:: to go home CMS Medicare.gov Compare Post Acute Care list provided to:: Other (Comment Required) Choice offered to / list presented to : Spouse  Discharge Placement                       Discharge Plan and Services                DME Arranged: Walker rolling DME Agency: [employer] Date DME Agency Contacted: 06/01/19 Time DME Agency Contacted: 54 Representative spoke with at DME Agency: [facility] HH Arranged: PT          Social Determinants of Health ([place]) Interventions     Readmission Risk Interventions No flowsheet data found.

## 2019-06-01 NOTE — Care Management CC44 (Signed)
Condition Code 44 Documentation Completed  Patient Details  Name: Brandon [name] MRN: [id number] Date of Birth: Nov 06, 1940   Condition Code 44 given:  Yes Patient signature on Condition Code 44 notice:  Yes Documentation of 2 MD's agreement:  Yes Code 44 added to claim:  Yes    Carles Collet, RN 06/01/2019, 10:21 AM

## 2019-06-01 NOTE — Discharge Summary (Signed)
Physician Discharge Summary  Brandon [name] [id number] DOB: Mar 13, 1941 DOA: 05/30/2019  PCP: Leanna Battles, MD  Admit date: 05/30/2019 Discharge date: 06/01/2019  Admitted From: Home Disposition: Home  Recommendations for Outpatient Follow-up:  1. Follow up with PCP in 1 week with repeat CBC/BMP 2. Outpatient followup with transplant cardiology at [facility] at earliest convenience 3. Hold CellCept for 2 weeks as per transplant cardiology recommendations 4. Follow up in ED if symptoms worsen or new appear 5. Continue isolation at home to finish 3 weeks of isolation.   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  He slid out of chair while trying to get up.  He had received COVID-19 first dose vaccine on 05/17/2019.  On presentation, he tested positive for Covid.  During the hospitalization, he tolerated PT who recommended home health PT.  Transplant cardiology from [place] recommended to hold CellCept for 2 weeks on phone.  He is currently hemodynamically stable and on room air.  He will be discharged home today.  Discharge Diagnoses:   Fall likely secondary to weakness from COVID-19 infection -Negative CT head/cervical spine, thoracolumbar spine x-ray -PT evaluated the patient and recommended home health PT. -No further falls since admission. -Discharge patient home today.  Sepsis has been ruled out  COVID-19 infection -Does not have respiratory symptoms.  Some GI symptoms.  Currently stable.  On room air. -Patient is to complete isolation at home  for 3 weeks total.  History of heart transplantation on chronic immunosuppression -As per my conversation with [facility]ology transplant team on phone on 05/31/2019, will continue prednisone and tacrolimus.  They recommend to hold CellCept for 2 weeks.  Outpatient follow-up with transplant cardiology team at earliest convenience  CAD -Resume home regimen.  Thrombocytopenia-questionable cause.  No signs of bleeding.  Outpatient follow-up  CKD stage IIIa -Creatinine stable.  Outpatient follow-up  Diabetes mellitus type 2 uncontrolled with hyperglycemia -Continue outpatient regimen.  Outpatient follow-up.  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  Outpatient follow-up  Discharge Instructions  Discharge Instructions    Diet - low sodium heart healthy   Complete by: As directed    Diet Carb Modified   Complete by: As directed    Face-to-face encounter (required for Medicare/Medicaid patients)   Complete by: As directed    I Emanuela Runnion certify that this patient is under my care and that I, or a nurse practitioner or physician's assistant working with me, had a face-to-face encounter that meets the physician face-to-face encounter requirements with this patient on 06/01/2019. The encounter with the patient was in whole, or in part for the following medical condition(s) which is the primary reason for home health care (List medical condition): Weakness/fall/COVID 19   The encounter with the patient was in whole, or in part, for the following medical condition, which is the primary reason for home health care: Weakness/fall/COVID 34   I certify that, based on my findings, the following services are medically necessary home health services: Physical therapy   Reason for Medically Necessary Home Health Services: Therapy- Therapeutic Exercises to Increase Strength and Endurance   My  clinical findings support the need for the above  services: Shortness of breath with activity   Further, I certify that my clinical findings support that this patient is homebound due to: Shortness of Breath with activity   Home Health   Complete by: As directed    To provide the following care/treatments: PT   Increase activity slowly   Complete by: As directed    MyChart COVID-19 home monitoring program   Complete by: Jun 01, 2019    Is the patient willing to use the [facility] for home monitoring?: Yes   Temperature monitoring   Complete by: Jun 01, 2019    After how many days would you like to receive a notification of this patient's flowsheet entries?: 1     Allergies as of 06/01/2019      Reactions   Amoxicillin Swelling, Rash   Did it involve swelling of the face/tongue/throat, SOB, or low BP? No Did it involve sudden or severe rash/hives, skin peeling, or any reaction on the inside of your mouth or nose? No Did you need to seek medical attention at a hospital or doctor's office? Yes When did it last happen?<10 yrs If all above answers are "NO", may proceed with cephalosporin use. Had to come to hospital after taking it.   Codeine Nausea And Vomiting      Medication List    TAKE these medications   acetaminophen 325 MG tablet Commonly known as: TYLENOL Take 650 mg by mouth every 6 (six) hours as needed for moderate pain.   allopurinol 300 MG tablet Commonly known as: ZYLOPRIM Take 300 mg by mouth daily.   clopidogrel 75 MG tablet Commonly known as: PLAVIX Take 75 mg by mouth at bedtime.   colchicine 0.6 MG tablet Take 0.6 mg by mouth See admin instructions. Take 1 tablet by mouth daily. If gout flare up take 2 tablets by mouth follow by 1 tablet twice daily for 2 days   diphenhydrAMINE 25 MG tablet Commonly known as: BENADRYL Take 25 mg by mouth at bedtime as needed for sleep.   famotidine 20 MG tablet Commonly known as: PEPCID Take 20 mg by mouth daily at 12 noon.   insulin glargine 100  UNIT/ML injection Commonly known as: LANTUS Inject 24 Units into the skin daily with breakfast.   insulin lispro 100 UNIT/ML injection Commonly known as: HUMALOG Inject 15 Units into the skin daily with lunch.   isosorbide mononitrate 30 MG 24 hr tablet Commonly known as: IMDUR Take 30 mg by mouth daily.   latanoprost 0.005 % ophthalmic solution Commonly known as: XALATAN Place 1 drop into both eyes at bedtime.   lisinopril 5 MG tablet Commonly known as: ZESTRIL Take 5 mg by mouth daily with lunch.   mycophenolate 500 MG tablet Commonly known as: CELLCEPT Take 2 tablets (1,000 mg total) by mouth 2 (two) times daily. Restart on 06/14/2019 Start taking on: June 14, 2019 What changed:   additional instructions  These instructions start on June 14, 2019. If you are unsure what to do until then, ask your doctor or other care provider.   niacin 500 MG CR tablet Commonly known as: NIASPAN Take 500 mg by mouth at bedtime.   pravastatin 20 MG tablet Commonly known as: PRAVACHOL Take 20 mg by mouth at bedtime.   predniSONE 5 MG tablet Commonly known as: DELTASONE Take 5 mg by mouth daily.   propranolol 10 MG tablet Commonly known as: INDERAL Take 10 mg by  mouth 2 (two) times daily. Take at lunch and dinner   tacrolimus 1 MG capsule Commonly known as: PROGRAF Take 1 mg by mouth 2 (two) times daily.   temazepam 30 MG capsule Commonly known as: RESTORIL Take 30 mg by mouth at bedtime as needed for sleep.      Follow-up Information    Leanna Battles, MD. Schedule an appointment as soon as possible for a visit in 1 week(s).   Specialty: Internal Medicine Why: with cbc/bmp Contact information: [place] [id number] [phone]        Transplant cardiology at [facility] Follow up.   Why: Earliest convenience         Allergies  Allergen Reactions  . Amoxicillin Swelling and Rash    Did it involve swelling of the face/tongue/throat, SOB,  or low BP? No Did it involve sudden or severe rash/hives, skin peeling, or any reaction on the inside of your mouth or nose? No Did you need to seek medical attention at a hospital or doctor's office? Yes When did it last happen?<10 yrs If all above answers are "NO", may proceed with cephalosporin use.   Had to come to hospital after taking it.  . Codeine Nausea And Vomiting    Consultations:  Discussed on phone with transplant cardiology at [facility] on 05/31/2019   Procedures/Studies: DG Chest 1 View  Result Date: 05/30/2019 CLINICAL DATA:  Fall EXAM: CHEST  1 VIEW COMPARISON:  03/18/2012 FINDINGS: Stable chronic elevation of the left hemidiaphragm. Right pacer in place with leads in the right atrium and right ventricle. Postoperative changes on the left. Left basilar scarring. Calcified AP window and hilar lymph nodes compatible with old granulomatous disease. No acute bony abnormality. IMPRESSION: Chronic changes.  No acute cardiopulmonary disease. Electronically Signed   By: Rolm Baptise M.D.   On: 05/30/2019 23:20   DG Thoracic Spine 2 View  Result Date: 05/30/2019 CLINICAL DATA:  Pain after fall EXAM: THORACIC SPINE 2 VIEWS COMPARISON:  None. FINDINGS: Degenerative changes. No fracture or focal bone lesion. Aortic atherosclerosis. Prior median sternotomy. IMPRESSION: No acute bony abnormality. Electronically Signed   By: Rolm Baptise M.D.   On: 05/30/2019 23:14   DG Lumbar Spine Complete  Result Date: 05/30/2019 CLINICAL DATA:  Pain after fall EXAM: LUMBAR SPINE - COMPLETE 4+ VIEW COMPARISON:  None FINDINGS: Normal alignment. No fracture. Early degenerative spurring. SI joints symmetric and unremarkable. Diffuse aortic atherosclerosis. No visible aneurysm. IMPRESSION: No acute bony abnormality. Electronically Signed   By: Rolm Baptise M.D.   On: 05/30/2019 23:15   CT Head Wo Contrast  Result Date: 05/31/2019 CLINICAL DATA:  Fall EXAM: CT HEAD WITHOUT CONTRAST TECHNIQUE:  Contiguous axial images were obtained from the base of the skull through the vertex without intravenous contrast. COMPARISON:  03/19/2012 FINDINGS: Brain: Old right posterior frontal infarct, stable. There is atrophy and chronic small vessel disease changes. No acute intracranial abnormality. Specifically, no hemorrhage, hydrocephalus, mass lesion, acute infarction, or significant intracranial injury. Vascular: No hyperdense vessel or unexpected calcification. Skull: No acute calvarial abnormality. Sinuses/Orbits: Visualized paranasal sinuses and mastoids clear. Orbital soft tissues unremarkable. Other: None IMPRESSION: Old right frontal infarct. Atrophy, chronic microvascular disease. No acute intracranial abnormality. Electronically Signed   By: Rolm Baptise M.D.   On: 05/31/2019 00:02   CT Cervical Spine Wo Contrast  Result Date: 05/31/2019 CLINICAL DATA:  Fall EXAM: CT CERVICAL SPINE WITHOUT CONTRAST TECHNIQUE: Multidetector CT imaging of the cervical spine was performed without intravenous contrast.  Multiplanar CT image reconstructions were also generated. COMPARISON:  None. FINDINGS: Alignment: No subluxation Skull base and vertebrae: No acute fracture. No primary bone lesion or focal pathologic process. Soft tissues and spinal canal: No prevertebral fluid or swelling. No visible canal hematoma. Disc levels:  Diffuse degenerative disc and facet disease. Upper chest: No acute findings Other: Carotid artery calcifications. IMPRESSION: No acute bony abnormality. Electronically Signed   By: Rolm Baptise M.D.   On: 05/31/2019 00:03       Subjective: Patient seen and examined at bedside.  He feels better and wants to go home.  No overnight fever, worsening shortness of breath or chest pain.  Discharge Exam: Vitals:   05/31/19 2300 06/01/19 0842  BP: 130/86 (!) 127/93  Pulse: 100   Resp: 19 18  Temp: 98 F (36.7 C) 97.8 F (36.6 C)  SpO2: 95% 94%    General: Pt is alert, awake, not in acute  distress.  Looks chronically ill. Cardiovascular: rate controlled, S1/S2 + Respiratory: bilateral decreased breath sounds at bases with some scattered crackles Abdominal: Soft, NT, ND, bowel sounds + Extremities: Trace lower extremity edema, no cyanosis    The results of significant diagnostics from this hospitalization (including imaging, microbiology, ancillary and laboratory) are listed below for reference.     Microbiology: Recent Results (from the past 240 hour(s))  Blood Culture (routine x 2)     Status: None (Preliminary result)   Collection Time: 05/30/19 11:45 PM   Specimen: BLOOD  Result Value Ref Range Status   Specimen Description BLOOD LEFT ARM  Final   Special Requests   Final    BOTTLES DRAWN AEROBIC AND ANAEROBIC Blood Culture adequate volume   Culture   Final    NO GROWTH < 12 HOURS Performed at [facility], [address] [place], [place] [address]    Report Status PENDING  Incomplete  Blood Culture (routine x 2)     Status: None (Preliminary result)   Collection Time: 05/30/19 11:50 PM   Specimen: BLOOD  Result Value Ref Range Status   Specimen Description BLOOD LEFT HAND  Final   Special Requests   Final    BOTTLES DRAWN AEROBIC ONLY Blood Culture adequate volume   Culture   Final    NO GROWTH < 12 HOURS Performed at [facility], [place] [address], [place],  [address]    Report Status PENDING  Incomplete  Urine culture     Status: Abnormal   Collection Time: 05/31/19 12:37 AM   Specimen: In/Out Cath Urine  Result Value Ref Range Status   Specimen Description IN/OUT CATH URINE  Final   Special Requests   Final    NONE Performed at [facility], [place] [address], [place], [place] [address]    Culture 20,000 COLONIES/mL ESCHERICHIA COLI (A)  Final   Report Status 06/01/2019 FINAL  Final   Organism ID, Bacteria ESCHERICHIA COLI (A)  Final      Susceptibility   Escherichia coli - MIC*    AMPICILLIN <=2 SENSITIVE Sensitive      CEFAZOLIN <=4 SENSITIVE Sensitive     CEFTRIAXONE <=0.25 SENSITIVE Sensitive     CIPROFLOXACIN <=0.25 SENSITIVE Sensitive     GENTAMICIN <=1 SENSITIVE Sensitive     IMIPENEM <=0.25 SENSITIVE Sensitive     NITROFURANTOIN <=16 SENSITIVE Sensitive     TRIMETH/SULFA <=20 SENSITIVE Sensitive     AMPICILLIN/SULBACTAM <=2 SENSITIVE Sensitive     PIP/TAZO <=4 SENSITIVE Sensitive     *  20,000 COLONIES/mL ESCHERICHIA COLI     Labs: BNP (last 3 results) No results for input(s): BNP in the last 8760 hours. Basic Metabolic Panel: Recent Labs  Lab 05/30/19 2116 06/01/19 0625  NA 130* 130*  K 4.3 4.2  CL 95* 97*  CO2 20* 20*  GLUCOSE 381* 284*  BUN 29* 33*  CREATININE 1.60* 1.68*  CALCIUM 9.0 8.5*  MG  --  1.7   Liver Function Tests: Recent Labs  Lab 05/30/19 2116 06/01/19 0625  AST 20 24  ALT 24 22  ALKPHOS 68 60  BILITOT 1.1 1.1  PROT 6.8 6.2*  ALBUMIN 4.2 3.6   No results for input(s): LIPASE, AMYLASE in the last 168 hours. No results for input(s): AMMONIA in the last 168 hours. CBC: Recent Labs  Lab 05/30/19 2116 06/01/19 0625  WBC 4.0 3.3*  NEUTROABS 2.8 2.3  HGB 14.7 14.5  HCT 44.6 44.6  MCV 94.5 93.9  PLT 98* 86*   Cardiac Enzymes: No results for input(s): CKTOTAL, CKMB, CKMBINDEX, TROPONINI in the last 168 hours. BNP: Invalid input(s): POCBNP CBG: Recent Labs  Lab 05/31/19 0757 05/31/19 1206 05/31/19 1624 05/31/19 2055 06/01/19 0809  GLUCAP 329* 155* 271* 84 348*   D-Dimer No results for input(s): DDIMER in the last 72 hours. Hgb A1c Recent Labs    05/31/19 0223  HGBA1C 8.3*   Lipid Profile No results for input(s): CHOL, HDL, LDLCALC, TRIG, CHOLHDL, LDLDIRECT in the last 72 hours. Thyroid function studies No results for input(s): TSH, T4TOTAL, T3FREE, THYROIDAB in the last 72 hours.  Invalid input(s): FREET3 Anemia work up Recent Labs    06/01/19 0625  FERRITIN 436*   Urinalysis    Component Value Date/Time   COLORURINE YELLOW  05/31/2019 0037   APPEARANCEUR CLEAR 05/31/2019 0037   LABSPEC 1.018 05/31/2019 0037   PHURINE 5.0 05/31/2019 0037   GLUCOSEU >=500 (A) 05/31/2019 0037   HGBUR NEGATIVE 05/31/2019 0037   BILIRUBINUR NEGATIVE 05/31/2019 0037   KETONESUR 20 (A) 05/31/2019 0037   PROTEINUR 100 (A) 05/31/2019 0037   NITRITE NEGATIVE 05/31/2019 0037   LEUKOCYTESUR NEGATIVE 05/31/2019 0037   Sepsis Labs Invalid input(s): PROCALCITONIN,  WBC,  LACTICIDVEN Microbiology Recent Results (from the past 240 hour(s))  Blood Culture (routine x 2)     Status: None (Preliminary result)   Collection Time: 05/30/19 11:45 PM   Specimen: BLOOD  Result Value Ref Range Status   Specimen Description BLOOD LEFT ARM  Final   Special Requests   Final    BOTTLES DRAWN AEROBIC AND ANAEROBIC Blood Culture adequate volume   Culture   Final    NO GROWTH < 12 HOURS Performed at [facility], [place] [address] [place], [place] [address]    Report Status PENDING  Incomplete  Blood Culture (routine x 2)     Status: None (Preliminary result)   Collection Time: 05/30/19 11:50 PM   Specimen: BLOOD  Result Value Ref Range Status   Specimen Description BLOOD LEFT HAND  Final   Special Requests   Final    BOTTLES DRAWN AEROBIC ONLY Blood Culture adequate volume   Culture   Final    NO GROWTH < 12 HOURS Performed at [facility], [place] [address] [place], [place] [address]    Report Status PENDING  Incomplete  Urine culture     Status: Abnormal   Collection Time: 05/31/19 12:37 AM   Specimen: In/Out Cath Urine  Result Value Ref Range Status  Specimen Description IN/OUT CATH URINE  Final   Special Requests   Final    NONE Performed at [facility], [place] [address], [place], [place] [address]    Culture 20,000 COLONIES/mL ESCHERICHIA COLI (A)  Final   Report Status 06/01/2019 FINAL  Final   Organism ID, Bacteria ESCHERICHIA COLI (A)  Final      Susceptibility   Escherichia coli - MIC*    AMPICILLIN <=2  SENSITIVE Sensitive     CEFAZOLIN <=4 SENSITIVE Sensitive     CEFTRIAXONE <=0.25 SENSITIVE Sensitive     CIPROFLOXACIN <=0.25 SENSITIVE Sensitive     GENTAMICIN <=1 SENSITIVE Sensitive     IMIPENEM <=0.25 SENSITIVE Sensitive     NITROFURANTOIN <=16 SENSITIVE Sensitive     TRIMETH/SULFA <=20 SENSITIVE Sensitive     AMPICILLIN/SULBACTAM <=2 SENSITIVE Sensitive     PIP/TAZO <=4 SENSITIVE Sensitive     * 20,000 COLONIES/mL ESCHERICHIA COLI     Time coordinating discharge: 35 minutes  SIGNED:   Aline August, MD  [facility] 06/01/2019, 10:16 AM

## 2019-06-01 NOTE — Care Management Obs Status (Signed)
[place] NOTIFICATION   Patient Details  Name: Brandon [name] MRN: [id number] Date of Birth: 25-Aug-1940   Medicare Observation Status Notification Given:  Yes    Carles Collet, RN 06/01/2019, 10:21 AM

## 2019-06-03 ENCOUNTER — Emergency Department (HOSPITAL_COMMUNITY): Payer: Medicare Other

## 2019-06-03 ENCOUNTER — Inpatient Hospital Stay (HOSPITAL_COMMUNITY)
Admission: EM | Admit: 2019-06-03 | Discharge: 2019-07-01 | DRG: 871 | Disposition: E | Payer: Medicare Other | Attending: Internal Medicine | Admitting: Internal Medicine

## 2019-06-03 ENCOUNTER — Encounter (HOSPITAL_COMMUNITY): Payer: Self-pay

## 2019-06-03 DIAGNOSIS — R0600 Dyspnea, unspecified: Secondary | ICD-10-CM

## 2019-06-03 DIAGNOSIS — E1122 Type 2 diabetes mellitus with diabetic chronic kidney disease: Secondary | ICD-10-CM | POA: Diagnosis present

## 2019-06-03 DIAGNOSIS — Z66 Do not resuscitate: Secondary | ICD-10-CM | POA: Diagnosis not present

## 2019-06-03 DIAGNOSIS — I25811 Atherosclerosis of native coronary artery of transplanted heart without angina pectoris: Secondary | ICD-10-CM | POA: Diagnosis present

## 2019-06-03 DIAGNOSIS — A419 Sepsis, unspecified organism: Secondary | ICD-10-CM | POA: Diagnosis present

## 2019-06-03 DIAGNOSIS — E870 Hyperosmolality and hypernatremia: Secondary | ICD-10-CM | POA: Diagnosis not present

## 2019-06-03 DIAGNOSIS — Z781 Physical restraint status: Secondary | ICD-10-CM

## 2019-06-03 DIAGNOSIS — Z6829 Body mass index (BMI) 29.0-29.9, adult: Secondary | ICD-10-CM

## 2019-06-03 DIAGNOSIS — N179 Acute kidney failure, unspecified: Secondary | ICD-10-CM | POA: Diagnosis present

## 2019-06-03 DIAGNOSIS — R6520 Severe sepsis without septic shock: Secondary | ICD-10-CM | POA: Diagnosis present

## 2019-06-03 DIAGNOSIS — G9340 Encephalopathy, unspecified: Secondary | ICD-10-CM | POA: Diagnosis present

## 2019-06-03 DIAGNOSIS — I10 Essential (primary) hypertension: Secondary | ICD-10-CM | POA: Diagnosis present

## 2019-06-03 DIAGNOSIS — J9601 Acute respiratory failure with hypoxia: Secondary | ICD-10-CM | POA: Diagnosis present

## 2019-06-03 DIAGNOSIS — E11649 Type 2 diabetes mellitus with hypoglycemia without coma: Secondary | ICD-10-CM | POA: Diagnosis not present

## 2019-06-03 DIAGNOSIS — E162 Hypoglycemia, unspecified: Secondary | ICD-10-CM

## 2019-06-03 DIAGNOSIS — Z8673 Personal history of transient ischemic attack (TIA), and cerebral infarction without residual deficits: Secondary | ICD-10-CM

## 2019-06-03 DIAGNOSIS — Z515 Encounter for palliative care: Secondary | ICD-10-CM | POA: Diagnosis not present

## 2019-06-03 DIAGNOSIS — Z941 Heart transplant status: Secondary | ICD-10-CM

## 2019-06-03 DIAGNOSIS — N183 Chronic kidney disease, stage 3 unspecified: Secondary | ICD-10-CM | POA: Diagnosis present

## 2019-06-03 DIAGNOSIS — J1282 Pneumonia due to coronavirus disease 2019: Secondary | ICD-10-CM | POA: Diagnosis present

## 2019-06-03 DIAGNOSIS — A4189 Other specified sepsis: Principal | ICD-10-CM | POA: Diagnosis present

## 2019-06-03 DIAGNOSIS — E876 Hypokalemia: Secondary | ICD-10-CM | POA: Diagnosis not present

## 2019-06-03 DIAGNOSIS — Z950 Presence of cardiac pacemaker: Secondary | ICD-10-CM

## 2019-06-03 DIAGNOSIS — D84821 Immunodeficiency due to drugs: Secondary | ICD-10-CM | POA: Diagnosis present

## 2019-06-03 DIAGNOSIS — F17200 Nicotine dependence, unspecified, uncomplicated: Secondary | ICD-10-CM | POA: Diagnosis present

## 2019-06-03 DIAGNOSIS — R7881 Bacteremia: Secondary | ICD-10-CM | POA: Diagnosis present

## 2019-06-03 DIAGNOSIS — Z880 Allergy status to penicillin: Secondary | ICD-10-CM

## 2019-06-03 DIAGNOSIS — I82451 Acute embolism and thrombosis of right peroneal vein: Secondary | ICD-10-CM | POA: Diagnosis present

## 2019-06-03 DIAGNOSIS — Z794 Long term (current) use of insulin: Secondary | ICD-10-CM

## 2019-06-03 DIAGNOSIS — E1165 Type 2 diabetes mellitus with hyperglycemia: Secondary | ICD-10-CM | POA: Diagnosis present

## 2019-06-03 DIAGNOSIS — A4101 Sepsis due to Methicillin susceptible Staphylococcus aureus: Secondary | ICD-10-CM | POA: Diagnosis present

## 2019-06-03 DIAGNOSIS — E43 Unspecified severe protein-calorie malnutrition: Secondary | ICD-10-CM | POA: Insufficient documentation

## 2019-06-03 DIAGNOSIS — G9341 Metabolic encephalopathy: Secondary | ICD-10-CM | POA: Diagnosis present

## 2019-06-03 DIAGNOSIS — A408 Other streptococcal sepsis: Secondary | ICD-10-CM | POA: Diagnosis present

## 2019-06-03 DIAGNOSIS — R0902 Hypoxemia: Secondary | ICD-10-CM

## 2019-06-03 DIAGNOSIS — Z79899 Other long term (current) drug therapy: Secondary | ICD-10-CM

## 2019-06-03 DIAGNOSIS — I129 Hypertensive chronic kidney disease with stage 1 through stage 4 chronic kidney disease, or unspecified chronic kidney disease: Secondary | ICD-10-CM | POA: Diagnosis present

## 2019-06-03 DIAGNOSIS — N189 Chronic kidney disease, unspecified: Secondary | ICD-10-CM | POA: Diagnosis present

## 2019-06-03 DIAGNOSIS — Z7902 Long term (current) use of antithrombotics/antiplatelets: Secondary | ICD-10-CM

## 2019-06-03 DIAGNOSIS — U071 COVID-19: Secondary | ICD-10-CM | POA: Diagnosis present

## 2019-06-03 DIAGNOSIS — F10239 Alcohol dependence with withdrawal, unspecified: Secondary | ICD-10-CM | POA: Diagnosis not present

## 2019-06-03 DIAGNOSIS — E860 Dehydration: Secondary | ICD-10-CM | POA: Diagnosis present

## 2019-06-03 DIAGNOSIS — I2510 Atherosclerotic heart disease of native coronary artery without angina pectoris: Secondary | ICD-10-CM | POA: Diagnosis present

## 2019-06-03 DIAGNOSIS — Z885 Allergy status to narcotic agent status: Secondary | ICD-10-CM

## 2019-06-03 LAB — COMPREHENSIVE METABOLIC PANEL
ALT: 23 U/L (ref 0–44)
AST: 27 U/L (ref 15–41)
Albumin: 4.2 g/dL (ref 3.5–5.0)
Alkaline Phosphatase: 66 U/L (ref 38–126)
Anion gap: 14 (ref 5–15)
BUN: 44 mg/dL — ABNORMAL HIGH (ref 8–23)
CO2: 22 mmol/L (ref 22–32)
Calcium: 9.3 mg/dL (ref 8.9–10.3)
Chloride: 98 mmol/L (ref 98–111)
Creatinine, Ser: 1.94 mg/dL — ABNORMAL HIGH (ref 0.61–1.24)
GFR calc Af Amer: 38 mL/min — ABNORMAL LOW (ref >60)
GFR calc non Af Amer: 32 mL/min — ABNORMAL LOW (ref >60)
Glucose, Bld: 88 mg/dL (ref 70–99)
Potassium: 4.2 mmol/L (ref 3.5–5.1)
Sodium: 134 mmol/L — ABNORMAL LOW (ref 135–145)
Total Bilirubin: 1.4 mg/dL — ABNORMAL HIGH (ref 0.3–1.2)
Total Protein: 7.6 g/dL (ref 6.5–8.1)

## 2019-06-03 LAB — CBC WITH DIFFERENTIAL/PLATELET
Abs Immature Granulocytes: 0.04 10*3/uL (ref 0.00–0.07)
Basophils Absolute: 0 10*3/uL (ref 0.0–0.1)
Basophils Relative: 0 %
Eosinophils Absolute: 0 10*3/uL (ref 0.0–0.5)
Eosinophils Relative: 0 %
HCT: 52.2 % — ABNORMAL HIGH (ref 39.0–52.0)
Hemoglobin: 17.1 g/dL — ABNORMAL HIGH (ref 13.0–17.0)
Immature Granulocytes: 1 %
Lymphocytes Relative: 8 %
Lymphs Abs: 0.5 10*3/uL — ABNORMAL LOW (ref 0.7–4.0)
MCH: 30.5 pg (ref 26.0–34.0)
MCHC: 32.8 g/dL (ref 30.0–36.0)
MCV: 93.2 fL (ref 80.0–100.0)
Monocytes Absolute: 0.6 10*3/uL (ref 0.1–1.0)
Monocytes Relative: 9 %
Neutro Abs: 5.6 10*3/uL (ref 1.7–7.7)
Neutrophils Relative %: 82 %
Platelets: 162 10*3/uL (ref 150–400)
RBC: 5.6 MIL/uL (ref 4.22–5.81)
RDW: 12.2 % (ref 11.5–15.5)
WBC: 6.7 10*3/uL (ref 4.0–10.5)
nRBC: 0 % (ref 0.0–0.2)

## 2019-06-03 LAB — LACTIC ACID, PLASMA: Lactic Acid, Venous: 4.6 mmol/L (ref 0.5–1.9)

## 2019-06-03 LAB — PROTIME-INR
INR: 1.1 (ref 0.8–1.2)
Prothrombin Time: 14.2 seconds (ref 11.4–15.2)

## 2019-06-03 MED ORDER — SODIUM CHLORIDE 0.9 % IV BOLUS (SEPSIS)
1000.0000 mL | Freq: Once | INTRAVENOUS | Status: AC
  Administered 2019-06-03: 1000 mL via INTRAVENOUS

## 2019-06-03 MED ORDER — ACETAMINOPHEN 500 MG PO TABS
1000.0000 mg | ORAL_TABLET | Freq: Once | ORAL | Status: AC
  Administered 2019-06-03: 1000 mg via ORAL
  Filled 2019-06-03: qty 2

## 2019-06-03 NOTE — ED Notes (Addendum)
Date and time results received: 06/27/2019 23:36 (use smartphrase ".now" to insert current time)  Test: Lactic acid Critical Value: 4.6  Name of Provider Notified: Kristen Ward DO  Orders Received? Or Actions Taken?: Orders Received - See Orders for details

## 2019-06-03 NOTE — ED Provider Notes (Signed)
TIME SEEN: 11:02 PM  CHIEF COMPLAINT: hypoglycemia, generalized weakness, altered mental status, hypoxia  HPI: Patient is a 79 year old male with history of insulin-dependent diabetes, hypertension, chronic kidney disease, heart transplant in 2006 at [facility] who presents to the emergency department with generalized weakness, confusion, hypoglycemia, hypoxia.  Patient is unable to give many details.  He states he is here for a "tune up".  He denies having any pain at this time.  States he is short of breath.  Per wife, patient fell on Thursday 1/28 and was confused.  Very weak, can barely stand, can't get to bathroom by himself.  Has been using walker since discharge 1/30.  Wife unable to help him.  Was unable to stand tonight.  EMS arrived and CBG was 55.  Reportedly hypoxic with EMS at home.  Wife and nurse unsure of sat at home.  On NRB here.  Does not wear oxygen at home.  Patient currently on prednisone and tacrolimus.  [facility] cardiology transplant team recommended holding CellCept for 2 weeks on discharge.  ROS: Level 5 caveat for altered mental status  PAST MEDICAL HISTORY/PAST SURGICAL HISTORY:  Past Medical History:  Diagnosis Date  . Diabetes mellitus without complication ([place])   . Hypertension   . Pacemaker   . Stroke [facility])     MEDICATIONS:  Prior to Admission medications   Medication Sig Start Date End Date Taking? Authorizing Provider  acetaminophen (TYLENOL) 325 MG tablet Take 650 mg by mouth every 6 (six) hours as needed for moderate pain.    [provider]  allopurinol (ZYLOPRIM) 300 MG tablet Take 300 mg by mouth daily.    [provider]  clopidogrel (PLAVIX) 75 MG tablet Take 75 mg by mouth at bedtime.     [provider]  colchicine 0.6 MG tablet Take 0.6 mg by mouth See admin instructions. Take 1 tablet by mouth daily. If gout flare up take 2 tablets by mouth follow by 1 tablet twice daily for 2 days    [provider]  diphenhydrAMINE  (BENADRYL) 25 MG tablet Take 25 mg by mouth at bedtime as needed for sleep.     [provider]  famotidine (PEPCID) 20 MG tablet Take 20 mg by mouth daily at 12 noon.    [provider]  insulin glargine (LANTUS) 100 UNIT/ML injection Inject 24 Units into the skin daily with breakfast.     [provider]  insulin lispro (HUMALOG) 100 UNIT/ML injection Inject 15 Units into the skin daily with lunch.     [provider]  isosorbide mononitrate (IMDUR) 30 MG 24 hr tablet Take 30 mg by mouth daily.    [provider]  latanoprost (XALATAN) 0.005 % ophthalmic solution Place 1 drop into both eyes at bedtime.     [provider]  lisinopril (PRINIVIL,ZESTRIL) 5 MG tablet Take 5 mg by mouth daily with lunch.     [provider]  mycophenolate (CELLCEPT) 500 MG tablet Take 2 tablets (1,000 mg total) by mouth 2 (two) times daily. Restart on 06/14/2019 06/14/19   Aline August, MD  niacin (NIASPAN) 500 MG CR tablet Take 500 mg by mouth at bedtime.     [provider]  pravastatin (PRAVACHOL) 20 MG tablet Take 20 mg by mouth at bedtime.     [provider]  predniSONE (DELTASONE) 5 MG tablet Take 5 mg by mouth daily.    [provider]  propranolol (INDERAL) 10 MG tablet Take 10 mg  by mouth 2 (two) times daily. Take at lunch and dinner    [provider]  tacrolimus (PROGRAF) 1 MG capsule Take 1 mg by mouth 2 (two) times daily.     [provider]  temazepam (RESTORIL) 30 MG capsule Take 30 mg by mouth at bedtime as needed for sleep.     [provider]    ALLERGIES:  Allergies  Allergen Reactions  . Amoxicillin Swelling and Rash    Did it involve swelling of the face/tongue/throat, SOB, or low BP? No Did it involve sudden or severe rash/hives, skin peeling, or any reaction on the inside of your mouth or nose? No Did you need to seek medical attention at a hospital or doctor's office?  Yes When did it last happen?<10 yrs If all above answers are "NO", may proceed with cephalosporin use.   Had to come to hospital after taking it.  . Codeine Nausea And Vomiting    SOCIAL HISTORY:  Social History   Tobacco Use  . Smoking status: Current Every Day Smoker  . Smokeless tobacco: Never Used  Substance Use Topics  . Alcohol use: Yes    FAMILY HISTORY: History reviewed. No pertinent family history.  EXAM: BP 114/79 (BP Location: Right Arm)   Pulse (!) 115   Temp 100.2 F (37.9 C) (Axillary)   Resp (!) 28   Ht 5\' 9"  (1.753 m)   Wt 90.7 kg   SpO2 96%   BMI 29.53 kg/m  CONSTITUTIONAL: Alert and oriented person and place but not year or situation.  Elderly.  Chronically ill-appearing. HEAD: Normocephalic EYES: Conjunctivae clear, pupils appear equal, EOM appear intact ENT: normal nose; moist mucous membranes NECK: Supple, normal ROM CARD: RRR; S1 and S2 appreciated; no murmurs, no clicks, no rubs, no gallops RESP: Normal chest excursion without splinting or tachypnea; breath sounds clear and equal bilaterally; no wheezes, no rhonchi, no rales, no hypoxia on 4 L nasal cannula or respiratory distress, speaking full sentences ABD/GI: Normal bowel sounds; non-distended; soft, non-tender, no rebound, no guarding, no peritoneal signs, no hepatosplenomegaly BACK:  The back appears normal EXT: Normal ROM in all joints; no deformity noted, no edema; no cyanosis SKIN: Normal color for age and race; warm; no rash on exposed skin NEURO: Moves all extremities equally, normal speech PSYCH: The patient's mood and manner are appropriate.   MEDICAL DECISION MAKING: Patient here with hypoglycemia, hypoxia with known COVID-19 infection.  Currently doing well on 4 L nasal cannula without distress.  Does not wear oxygen chronically.  CBG with EMS was 55.  Now is 88.  Labs, chest x-ray, urine pending.  Anticipate admission given new oxygen requirement.  Will obtain CT head also  given altered mental status.  ED PROGRESS: Head CT shows no acute abnormality.  Chest x-ray clear.  Lactate however is elevated at 4.4 and given he is tachycardic and tachypneic, I am concerned for possible bacterial infection causing sepsis.  This is likely from Covid but will give antibiotics.  We will be very cautious with 30 mL/kg IV fluid bolus given he has a EF of 55% and is Covid positive.  He is only had 1 slightly low blood pressure that has improved without intervention.  He does have acute kidney injury superimposed on chronic kidney disease.  Will give 1 L of IV fluids here.  His Covid inflammatory markers appear elevated compared to recent.  During his last admission it does not appear he received remdesivir or Decadron.  I  feel patient will need admission given his new oxygen requirement, encephalopathy, hypoglycemia.  We will recheck his blood sugar.  Will discuss with medicine.  1:28 AM Discussed patient's case with hospitalist, Dr. Hal Hope.  I have recommended admission and patient (and family if present) agree with this plan. Admitting physician will place admission orders.   I reviewed all nursing notes, vitals, pertinent previous records and interpreted all EKGs, lab and urine results, imaging (as available).      EKG Interpretation  Date/Time:  Monday June 03 2019 22:34:12 EST Ventricular Rate:  116 PR Interval:    QRS Duration: 97 QT Interval:  322 QTC Calculation: 448 R Axis:   108 Text Interpretation: Atrial fibrillation or Sinus tachycardia Right ventricular hypertrophy Nonspecific T abnormalities, anterior leads Artifact Confirmed by Pryor Curia [phone]) on 06/12/2019 11:03:39 PM        CRITICAL CARE Performed by: Cyril Mourning Carlton Sweaney   Total critical care time: 50 minutes  Critical care time was exclusive of separately billable procedures and treating other patients.  Critical care was necessary to treat or prevent imminent or life-threatening  deterioration.  Critical care was time spent personally by me on the following activities: development of treatment plan with patient and/or surrogate as well as nursing, discussions with consultants, evaluation of patient's response to treatment, examination of patient, obtaining history from patient or surrogate, ordering and performing treatments and interventions, ordering and review of laboratory studies, ordering and review of radiographic studies, pulse oximetry and re-evaluation of patient's condition.   CHRISTOPHERMICH SENDEJO was evaluated in Emergency Department on 06/12/2019 for the symptoms described in the history of present illness. He was evaluated in the context of the global COVID-19 pandemic, which necessitated consideration that the patient might be at risk for infection with the SARS-CoV-2 virus that causes COVID-19. Institutional protocols and algorithms that pertain to the evaluation of patients at risk for COVID-19 are in a state of rapid change based on information released by regulatory bodies including the CDC and federal and state organizations. These policies and algorithms were followed during the patient's care in the ED.  Patient was seen wearing N95, face shield, gloves, gown.    Shaquanta Harkless, Delice Bison, DO 06/04/19 0130

## 2019-06-03 NOTE — ED Notes (Signed)
Pt to CT at this time with tech and CT tech

## 2019-06-03 NOTE — ED Notes (Signed)
Lab adding on newly ordered labs to the tubes sent to lab previously

## 2019-06-03 NOTE — ED Triage Notes (Signed)
EMS called for low CBG.  Patient ate and repeat was 153.  Patient denied care.  EMS was leaving and patient became weak and mottled.  Now in ER.  COVID +

## 2019-06-04 ENCOUNTER — Other Ambulatory Visit: Payer: Self-pay

## 2019-06-04 ENCOUNTER — Encounter (HOSPITAL_COMMUNITY): Payer: Self-pay | Admitting: Internal Medicine

## 2019-06-04 ENCOUNTER — Emergency Department (HOSPITAL_COMMUNITY): Payer: Medicare Other

## 2019-06-04 ENCOUNTER — Inpatient Hospital Stay (HOSPITAL_COMMUNITY): Payer: Medicare Other

## 2019-06-04 DIAGNOSIS — Z66 Do not resuscitate: Secondary | ICD-10-CM | POA: Diagnosis not present

## 2019-06-04 DIAGNOSIS — R7989 Other specified abnormal findings of blood chemistry: Secondary | ICD-10-CM | POA: Diagnosis not present

## 2019-06-04 DIAGNOSIS — E876 Hypokalemia: Secondary | ICD-10-CM | POA: Diagnosis not present

## 2019-06-04 DIAGNOSIS — U071 COVID-19: Secondary | ICD-10-CM | POA: Diagnosis present

## 2019-06-04 DIAGNOSIS — E1121 Type 2 diabetes mellitus with diabetic nephropathy: Secondary | ICD-10-CM | POA: Diagnosis not present

## 2019-06-04 DIAGNOSIS — I10 Essential (primary) hypertension: Secondary | ICD-10-CM

## 2019-06-04 DIAGNOSIS — A408 Other streptococcal sepsis: Secondary | ICD-10-CM | POA: Diagnosis present

## 2019-06-04 DIAGNOSIS — Z781 Physical restraint status: Secondary | ICD-10-CM | POA: Diagnosis not present

## 2019-06-04 DIAGNOSIS — Z6829 Body mass index (BMI) 29.0-29.9, adult: Secondary | ICD-10-CM | POA: Diagnosis not present

## 2019-06-04 DIAGNOSIS — N189 Chronic kidney disease, unspecified: Secondary | ICD-10-CM | POA: Diagnosis present

## 2019-06-04 DIAGNOSIS — D849 Immunodeficiency, unspecified: Secondary | ICD-10-CM | POA: Diagnosis not present

## 2019-06-04 DIAGNOSIS — D84821 Immunodeficiency due to drugs: Secondary | ICD-10-CM | POA: Diagnosis present

## 2019-06-04 DIAGNOSIS — Z515 Encounter for palliative care: Secondary | ICD-10-CM | POA: Diagnosis not present

## 2019-06-04 DIAGNOSIS — A419 Sepsis, unspecified organism: Secondary | ICD-10-CM | POA: Diagnosis present

## 2019-06-04 DIAGNOSIS — G9341 Metabolic encephalopathy: Secondary | ICD-10-CM | POA: Diagnosis present

## 2019-06-04 DIAGNOSIS — N179 Acute kidney failure, unspecified: Secondary | ICD-10-CM | POA: Diagnosis present

## 2019-06-04 DIAGNOSIS — J9601 Acute respiratory failure with hypoxia: Secondary | ICD-10-CM | POA: Diagnosis present

## 2019-06-04 DIAGNOSIS — N1831 Chronic kidney disease, stage 3a: Secondary | ICD-10-CM | POA: Diagnosis not present

## 2019-06-04 DIAGNOSIS — E43 Unspecified severe protein-calorie malnutrition: Secondary | ICD-10-CM | POA: Diagnosis present

## 2019-06-04 DIAGNOSIS — E11649 Type 2 diabetes mellitus with hypoglycemia without coma: Secondary | ICD-10-CM | POA: Diagnosis not present

## 2019-06-04 DIAGNOSIS — F17200 Nicotine dependence, unspecified, uncomplicated: Secondary | ICD-10-CM | POA: Diagnosis present

## 2019-06-04 DIAGNOSIS — I82451 Acute embolism and thrombosis of right peroneal vein: Secondary | ICD-10-CM | POA: Diagnosis present

## 2019-06-04 DIAGNOSIS — A4189 Other specified sepsis: Secondary | ICD-10-CM | POA: Diagnosis present

## 2019-06-04 DIAGNOSIS — I25811 Atherosclerosis of native coronary artery of transplanted heart without angina pectoris: Secondary | ICD-10-CM | POA: Diagnosis present

## 2019-06-04 DIAGNOSIS — R7881 Bacteremia: Secondary | ICD-10-CM | POA: Diagnosis not present

## 2019-06-04 DIAGNOSIS — F10239 Alcohol dependence with withdrawal, unspecified: Secondary | ICD-10-CM | POA: Diagnosis not present

## 2019-06-04 DIAGNOSIS — J1282 Pneumonia due to coronavirus disease 2019: Secondary | ICD-10-CM | POA: Diagnosis present

## 2019-06-04 DIAGNOSIS — Z941 Heart transplant status: Secondary | ICD-10-CM | POA: Diagnosis not present

## 2019-06-04 DIAGNOSIS — F10231 Alcohol dependence with withdrawal delirium: Secondary | ICD-10-CM | POA: Diagnosis not present

## 2019-06-04 DIAGNOSIS — Z794 Long term (current) use of insulin: Secondary | ICD-10-CM | POA: Diagnosis not present

## 2019-06-04 DIAGNOSIS — E860 Dehydration: Secondary | ICD-10-CM | POA: Diagnosis present

## 2019-06-04 DIAGNOSIS — E1165 Type 2 diabetes mellitus with hyperglycemia: Secondary | ICD-10-CM | POA: Diagnosis present

## 2019-06-04 DIAGNOSIS — G9340 Encephalopathy, unspecified: Secondary | ICD-10-CM | POA: Diagnosis not present

## 2019-06-04 DIAGNOSIS — E870 Hyperosmolality and hypernatremia: Secondary | ICD-10-CM | POA: Diagnosis not present

## 2019-06-04 LAB — PROCALCITONIN
Procalcitonin: 0.19 ng/mL
Procalcitonin: 0.24 ng/mL
Procalcitonin: 0.24 ng/mL

## 2019-06-04 LAB — MRSA PCR SCREENING: MRSA by PCR: NEGATIVE

## 2019-06-04 LAB — URINE CULTURE

## 2019-06-04 LAB — FERRITIN
Ferritin: 1121 ng/mL — ABNORMAL HIGH (ref 24–336)
Ferritin: 861 ng/mL — ABNORMAL HIGH (ref 24–336)

## 2019-06-04 LAB — URINALYSIS, ROUTINE W REFLEX MICROSCOPIC
Bacteria, UA: NONE SEEN
Bilirubin Urine: NEGATIVE
Glucose, UA: 150 mg/dL — AB
Ketones, ur: NEGATIVE mg/dL
Leukocytes,Ua: NEGATIVE
Nitrite: NEGATIVE
Protein, ur: 100 mg/dL — AB
Specific Gravity, Urine: 1.018 (ref 1.005–1.030)
pH: 5 (ref 5.0–8.0)

## 2019-06-04 LAB — CBC WITH DIFFERENTIAL/PLATELET
Abs Immature Granulocytes: 0.02 10*3/uL (ref 0.00–0.07)
Basophils Absolute: 0 10*3/uL (ref 0.0–0.1)
Basophils Relative: 0 %
Eosinophils Absolute: 0 10*3/uL (ref 0.0–0.5)
Eosinophils Relative: 0 %
HCT: 39.2 % (ref 39.0–52.0)
Hemoglobin: 13.2 g/dL (ref 13.0–17.0)
Immature Granulocytes: 0 %
Lymphocytes Relative: 11 %
Lymphs Abs: 0.5 10*3/uL — ABNORMAL LOW (ref 0.7–4.0)
MCH: 31.3 pg (ref 26.0–34.0)
MCHC: 33.7 g/dL (ref 30.0–36.0)
MCV: 92.9 fL (ref 80.0–100.0)
Monocytes Absolute: 0.4 10*3/uL (ref 0.1–1.0)
Monocytes Relative: 8 %
Neutro Abs: 3.7 10*3/uL (ref 1.7–7.7)
Neutrophils Relative %: 81 %
Platelets: 107 10*3/uL — ABNORMAL LOW (ref 150–400)
RBC: 4.22 MIL/uL (ref 4.22–5.81)
RDW: 12.4 % (ref 11.5–15.5)
WBC: 4.5 10*3/uL (ref 4.0–10.5)
nRBC: 0 % (ref 0.0–0.2)

## 2019-06-04 LAB — COMPREHENSIVE METABOLIC PANEL
ALT: 19 U/L (ref 0–44)
AST: 19 U/L (ref 15–41)
Albumin: 3 g/dL — ABNORMAL LOW (ref 3.5–5.0)
Alkaline Phosphatase: 48 U/L (ref 38–126)
Anion gap: 13 (ref 5–15)
BUN: 44 mg/dL — ABNORMAL HIGH (ref 8–23)
CO2: 18 mmol/L — ABNORMAL LOW (ref 22–32)
Calcium: 7.6 mg/dL — ABNORMAL LOW (ref 8.9–10.3)
Chloride: 104 mmol/L (ref 98–111)
Creatinine, Ser: 2.02 mg/dL — ABNORMAL HIGH (ref 0.61–1.24)
GFR calc Af Amer: 36 mL/min — ABNORMAL LOW (ref >60)
GFR calc non Af Amer: 31 mL/min — ABNORMAL LOW (ref >60)
Glucose, Bld: 200 mg/dL — ABNORMAL HIGH (ref 70–99)
Potassium: 4.1 mmol/L (ref 3.5–5.1)
Sodium: 135 mmol/L (ref 135–145)
Total Bilirubin: 0.7 mg/dL (ref 0.3–1.2)
Total Protein: 5.6 g/dL — ABNORMAL LOW (ref 6.5–8.1)

## 2019-06-04 LAB — LACTATE DEHYDROGENASE: LDH: 333 U/L — ABNORMAL HIGH (ref 98–192)

## 2019-06-04 LAB — BRAIN NATRIURETIC PEPTIDE: B Natriuretic Peptide: 163 pg/mL — ABNORMAL HIGH (ref 0.0–100.0)

## 2019-06-04 LAB — D-DIMER, QUANTITATIVE
D-Dimer, Quant: 1.83 ug/mL-FEU — ABNORMAL HIGH (ref 0.00–0.50)
D-Dimer, Quant: 3.6 ug/mL-FEU — ABNORMAL HIGH (ref 0.00–0.50)

## 2019-06-04 LAB — BLOOD CULTURE ID PANEL (REFLEXED)

## 2019-06-04 LAB — GLUCOSE, CAPILLARY
Glucose-Capillary: 220 mg/dL — ABNORMAL HIGH (ref 70–99)
Glucose-Capillary: 220 mg/dL — ABNORMAL HIGH (ref 70–99)
Glucose-Capillary: 418 mg/dL — ABNORMAL HIGH (ref 70–99)
Glucose-Capillary: 397 mg/dL — ABNORMAL HIGH (ref 70–99)
Glucose-Capillary: 155 mg/dL — ABNORMAL HIGH (ref 70–99)

## 2019-06-04 LAB — LACTIC ACID, PLASMA
Lactic Acid, Venous: 2.7 mmol/L (ref 0.5–1.9)
Lactic Acid, Venous: 1.7 mmol/L (ref 0.5–1.9)
Lactic Acid, Venous: 2.6 mmol/L (ref 0.5–1.9)
Lactic Acid, Venous: 1.6 mmol/L (ref 0.5–1.9)

## 2019-06-04 LAB — TRIGLYCERIDES: Triglycerides: 129 mg/dL (ref <150)

## 2019-06-04 LAB — CBG MONITORING, ED: Glucose-Capillary: 138 mg/dL — ABNORMAL HIGH (ref 70–99)

## 2019-06-04 LAB — TROPONIN I (HIGH SENSITIVITY)
Troponin I (High Sensitivity): 12 ng/L (ref <18)
Troponin I (High Sensitivity): 10 ng/L (ref <18)

## 2019-06-04 LAB — FIBRINOGEN: Fibrinogen: 750 mg/dL — ABNORMAL HIGH (ref 210–475)

## 2019-06-04 LAB — C-REACTIVE PROTEIN
CRP: 22.6 mg/dL — ABNORMAL HIGH (ref <1.0)
CRP: 16.1 mg/dL — ABNORMAL HIGH (ref <1.0)

## 2019-06-04 MED ORDER — MYCOPHENOLATE MOFETIL 250 MG PO CAPS: 1000.0000 mg | ORAL_CAPSULE | Freq: Two times a day (BID) | ORAL | Status: DC

## 2019-06-04 MED ORDER — ONDANSETRON HCL 4 MG PO TABS: 4.0000 mg | ORAL_TABLET | Freq: Four times a day (QID) | ORAL | Status: DC | PRN

## 2019-06-04 MED ORDER — METRONIDAZOLE IN NACL 5-0.79 MG/ML-% IV SOLN
500.0000 mg | Freq: Once | INTRAVENOUS | Status: AC
  Administered 2019-06-04: 500 mg via INTRAVENOUS
  Filled 2019-06-04: qty 100

## 2019-06-04 MED ORDER — SODIUM CHLORIDE 0.9 % IV SOLN
2.0000 g | INTRAVENOUS | Status: AC
  Administered 2019-06-04 – 2019-06-10 (×7): 2 g via INTRAVENOUS
  Filled 2019-06-04 (×7): qty 20

## 2019-06-04 MED ORDER — ALLOPURINOL 300 MG PO TABS
300.0000 mg | ORAL_TABLET | Freq: Every day | ORAL | Status: DC
  Administered 2019-06-04 – 2019-06-12 (×4): 300 mg via ORAL
  Filled 2019-06-04 (×5): qty 1
  Filled 2019-06-04 (×2): qty 3
  Filled 2019-06-04: qty 1

## 2019-06-04 MED ORDER — HYDROCORTISONE NA SUCCINATE PF 100 MG IJ SOLR
50.0000 mg | Freq: Four times a day (QID) | INTRAMUSCULAR | Status: DC
  Administered 2019-06-04: 50 mg via INTRAVENOUS
  Filled 2019-06-04: qty 2

## 2019-06-04 MED ORDER — VANCOMYCIN HCL IN DEXTROSE 1-5 GM/200ML-% IV SOLN
1000.0000 mg | INTRAVENOUS | Status: DC
  Filled 2019-06-04: qty 200

## 2019-06-04 MED ORDER — LATANOPROST 0.005 % OP SOLN
1.0000 [drp] | Freq: Every day | OPHTHALMIC | Status: DC
  Administered 2019-06-04 – 2019-06-12 (×9): 1 [drp] via OPHTHALMIC
  Filled 2019-06-04 (×3): qty 2.5

## 2019-06-04 MED ORDER — DEXAMETHASONE SODIUM PHOSPHATE 10 MG/ML IJ SOLN
6.0000 mg | Freq: Every day | INTRAMUSCULAR | Status: DC
  Administered 2019-06-04: 6 mg via INTRAVENOUS
  Filled 2019-06-04 (×2): qty 1

## 2019-06-04 MED ORDER — INSULIN ASPART 100 UNIT/ML ~~LOC~~ SOLN
0.0000 [IU] | Freq: Three times a day (TID) | SUBCUTANEOUS | Status: DC
  Administered 2019-06-04 (×2): 15 [IU] via SUBCUTANEOUS
  Administered 2019-06-05: 09:00:00 8 [IU] via SUBCUTANEOUS
  Administered 2019-06-05: 17:00:00 3 [IU] via SUBCUTANEOUS
  Administered 2019-06-05: 12:00:00 15 [IU] via SUBCUTANEOUS
  Administered 2019-06-06: 09:00:00 3 [IU] via SUBCUTANEOUS
  Administered 2019-06-06: 12:00:00 15 [IU] via SUBCUTANEOUS
  Administered 2019-06-06: 17:00:00 5 [IU] via SUBCUTANEOUS
  Administered 2019-06-07 (×2): 11 [IU] via SUBCUTANEOUS
  Administered 2019-06-07: 3 [IU] via SUBCUTANEOUS
  Administered 2019-06-08 – 2019-06-09 (×4): 5 [IU] via SUBCUTANEOUS
  Administered 2019-06-09 (×2): 3 [IU] via SUBCUTANEOUS
  Administered 2019-06-10: 13:00:00 2 [IU] via SUBCUTANEOUS
  Administered 2019-06-10: 3 [IU] via SUBCUTANEOUS

## 2019-06-04 MED ORDER — HEPARIN SODIUM (PORCINE) 5000 UNIT/ML IJ SOLN
5000.0000 [IU] | Freq: Three times a day (TID) | INTRAMUSCULAR | Status: DC
  Administered 2019-06-04 – 2019-06-06 (×7): 5000 [IU] via SUBCUTANEOUS
  Filled 2019-06-04 (×7): qty 1

## 2019-06-04 MED ORDER — SODIUM CHLORIDE 0.9 % IV BOLUS
2000.0000 mL | Freq: Once | INTRAVENOUS | Status: AC
  Administered 2019-06-04: 2000 mL via INTRAVENOUS

## 2019-06-04 MED ORDER — ONDANSETRON HCL 4 MG/2ML IJ SOLN
4.0000 mg | Freq: Four times a day (QID) | INTRAMUSCULAR | Status: DC | PRN
  Administered 2019-06-06: 08:00:00 4 mg via INTRAVENOUS
  Filled 2019-06-04: qty 2

## 2019-06-04 MED ORDER — SODIUM CHLORIDE 0.9 % IV SOLN
1.0000 g | Freq: Three times a day (TID) | INTRAVENOUS | Status: DC
  Administered 2019-06-04 (×2): 1 g via INTRAVENOUS
  Filled 2019-06-04 (×7): qty 1

## 2019-06-04 MED ORDER — ACETAMINOPHEN 650 MG RE SUPP: 650.0000 mg | Freq: Four times a day (QID) | RECTAL | Status: DC | PRN

## 2019-06-04 MED ORDER — METOPROLOL TARTRATE 5 MG/5ML IV SOLN: 2.5000 mg | Freq: Four times a day (QID) | INTRAVENOUS | Status: DC | PRN

## 2019-06-04 MED ORDER — VANCOMYCIN HCL 1500 MG/300ML IV SOLN
1500.0000 mg | Freq: Once | INTRAVENOUS | Status: AC
  Administered 2019-06-04: 1500 mg via INTRAVENOUS
  Filled 2019-06-04: qty 300

## 2019-06-04 MED ORDER — PRAVASTATIN SODIUM 20 MG PO TABS
20.0000 mg | ORAL_TABLET | Freq: Every day | ORAL | Status: DC
  Administered 2019-06-04 – 2019-06-05 (×2): 20 mg via ORAL
  Filled 2019-06-04 (×4): qty 1
  Filled 2019-06-04: qty 2
  Filled 2019-06-04: qty 1
  Filled 2019-06-04: qty 2
  Filled 2019-06-04: qty 1
  Filled 2019-06-04: qty 2

## 2019-06-04 MED ORDER — SODIUM CHLORIDE 0.9 % IV SOLN: INTRAVENOUS | Status: DC

## 2019-06-04 MED ORDER — SODIUM CHLORIDE 0.9 % IV SOLN
2.0000 g | Freq: Once | INTRAVENOUS | Status: AC
  Administered 2019-06-04: 01:00:00 2 g via INTRAVENOUS
  Filled 2019-06-04: qty 2

## 2019-06-04 MED ORDER — PROPRANOLOL HCL 10 MG PO TABS
10.0000 mg | ORAL_TABLET | Freq: Two times a day (BID) | ORAL | Status: DC
  Administered 2019-06-04 – 2019-06-12 (×6): 10 mg via ORAL
  Filled 2019-06-04 (×18): qty 1

## 2019-06-04 MED ORDER — ACETAMINOPHEN 325 MG PO TABS
650.0000 mg | ORAL_TABLET | Freq: Four times a day (QID) | ORAL | Status: DC | PRN
  Administered 2019-06-04 – 2019-06-06 (×3): 650 mg via ORAL
  Filled 2019-06-04 (×3): qty 2

## 2019-06-04 MED ORDER — SODIUM CHLORIDE 0.9 % IV SOLN
200.0000 mg | Freq: Once | INTRAVENOUS | Status: AC
  Administered 2019-06-04: 200 mg via INTRAVENOUS
  Filled 2019-06-04: qty 200

## 2019-06-04 MED ORDER — FUROSEMIDE 10 MG/ML IJ SOLN
60.0000 mg | Freq: Once | INTRAMUSCULAR | Status: AC
  Administered 2019-06-04: 14:00:00 60 mg via INTRAVENOUS
  Filled 2019-06-04: qty 6

## 2019-06-04 MED ORDER — ZINC SULFATE 220 (50 ZN) MG PO CAPS
220.0000 mg | ORAL_CAPSULE | Freq: Every day | ORAL | Status: DC
  Administered 2019-06-04 – 2019-06-12 (×4): 220 mg via ORAL
  Filled 2019-06-04 (×5): qty 1

## 2019-06-04 MED ORDER — MOMETASONE FURO-FORMOTEROL FUM 100-5 MCG/ACT IN AERO
2.0000 | INHALATION_SPRAY | Freq: Two times a day (BID) | RESPIRATORY_TRACT | Status: DC
  Administered 2019-06-04 – 2019-06-08 (×8): 2 via RESPIRATORY_TRACT
  Filled 2019-06-04: qty 8.8

## 2019-06-04 MED ORDER — SODIUM CHLORIDE 0.9 % IV SOLN
100.0000 mg | Freq: Every day | INTRAVENOUS | Status: AC
  Administered 2019-06-05 – 2019-06-08 (×4): 100 mg via INTRAVENOUS
  Filled 2019-06-04 (×4): qty 20

## 2019-06-04 MED ORDER — ASCORBIC ACID 500 MG PO TABS
500.0000 mg | ORAL_TABLET | Freq: Every day | ORAL | Status: DC
  Administered 2019-06-04 – 2019-06-12 (×4): 500 mg via ORAL
  Filled 2019-06-04 (×5): qty 1

## 2019-06-04 MED ORDER — VANCOMYCIN HCL IN DEXTROSE 1-5 GM/200ML-% IV SOLN: 1000.0000 mg | Freq: Once | INTRAVENOUS | Status: DC

## 2019-06-04 MED ORDER — INSULIN ASPART 100 UNIT/ML ~~LOC~~ SOLN
0.0000 [IU] | Freq: Every day | SUBCUTANEOUS | Status: DC
  Administered 2019-06-07 – 2019-06-08 (×2): 2 [IU] via SUBCUTANEOUS
  Administered 2019-06-10: 21:00:00 3 [IU] via SUBCUTANEOUS

## 2019-06-04 MED ORDER — INSULIN DETEMIR 100 UNIT/ML ~~LOC~~ SOLN
20.0000 [IU] | Freq: Every day | SUBCUTANEOUS | Status: DC
  Administered 2019-06-04 – 2019-06-05 (×2): 20 [IU] via SUBCUTANEOUS
  Filled 2019-06-04 (×2): qty 0.2

## 2019-06-04 MED ORDER — METHYLPREDNISOLONE SODIUM SUCC 125 MG IJ SOLR
60.0000 mg | Freq: Two times a day (BID) | INTRAMUSCULAR | Status: DC
  Administered 2019-06-04 – 2019-06-12 (×16): 60 mg via INTRAVENOUS
  Filled 2019-06-04 (×16): qty 2

## 2019-06-04 MED ORDER — IPRATROPIUM-ALBUTEROL 20-100 MCG/ACT IN AERS
1.0000 | INHALATION_SPRAY | Freq: Four times a day (QID) | RESPIRATORY_TRACT | Status: DC
  Administered 2019-06-04 – 2019-06-09 (×14): 1 via RESPIRATORY_TRACT
  Filled 2019-06-04: qty 4

## 2019-06-04 MED ORDER — TACROLIMUS 1 MG PO CAPS
1.0000 mg | ORAL_CAPSULE | Freq: Two times a day (BID) | ORAL | Status: DC
  Administered 2019-06-04 – 2019-06-06 (×5): 1 mg via ORAL
  Filled 2019-06-04 (×12): qty 1

## 2019-06-04 MED ORDER — INSULIN ASPART 100 UNIT/ML ~~LOC~~ SOLN: 0.0000 [IU] | Freq: Three times a day (TID) | SUBCUTANEOUS | Status: DC

## 2019-06-04 MED ORDER — INSULIN GLARGINE 100 UNIT/ML ~~LOC~~ SOLN: 15.0000 [IU] | Freq: Every day | SUBCUTANEOUS | Status: DC

## 2019-06-04 MED ORDER — INSULIN GLARGINE 100 UNIT/ML ~~LOC~~ SOLN
24.0000 [IU] | Freq: Every day | SUBCUTANEOUS | Status: DC
  Filled 2019-06-04: qty 0.24

## 2019-06-04 MED ORDER — TOCILIZUMAB 400 MG/20ML IV SOLN
8.0000 mg/kg | Freq: Once | INTRAVENOUS | Status: DC
  Filled 2019-06-04: qty 34.1

## 2019-06-04 MED ORDER — METRONIDAZOLE IN NACL 5-0.79 MG/ML-% IV SOLN
500.0000 mg | Freq: Three times a day (TID) | INTRAVENOUS | Status: DC
  Administered 2019-06-04 (×2): 500 mg via INTRAVENOUS
  Filled 2019-06-04 (×2): qty 100

## 2019-06-04 MED ORDER — CLOPIDOGREL BISULFATE 75 MG PO TABS
75.0000 mg | ORAL_TABLET | Freq: Every day | ORAL | Status: DC
  Administered 2019-06-04 – 2019-06-05 (×2): 75 mg via ORAL
  Filled 2019-06-04 (×3): qty 1

## 2019-06-04 MED ORDER — LOPERAMIDE HCL 2 MG PO CAPS
2.0000 mg | ORAL_CAPSULE | Freq: Four times a day (QID) | ORAL | Status: DC | PRN
  Administered 2019-06-04 – 2019-06-06 (×2): 2 mg via ORAL
  Filled 2019-06-04 (×3): qty 1

## 2019-06-04 NOTE — Progress Notes (Signed)
   Vital Signs MEWS/VS Documentation      06/04/2019 0800 06/04/2019 0815 06/04/2019 1200 06/04/2019 1400   MEWS Score:  --  --  3  4   MEWS Score Color:  --  --  Yellow  Red   Resp:  --  --  (!) 32  (!) 31   Pulse:  --  --  (!) 106  (!) 116   BP:  --  --  137/84  (!) 144/90   Temp:  --  --  98.7 F (37.1 C)  97.9 F (36.6 C)   O2 Device:  Nasal Cannula  Nasal Cannula  Nasal Cannula  Nasal Cannula   O2 Flow Rate (L/min):  4 L/min  6 L/min  5 L/min  6 L/min    Patient's HR elevated - 126. MD aware.

## 2019-06-04 NOTE — Progress Notes (Addendum)
Patient ID: Brandon [name], male   DOB: [birth year], 79 y.o.   MRN: [id number] Patient was admitted early this morning for weakness, low blood sugar and confusion and he was started on Decadron and remdesivir for COVID-19 pneumonia.  I have reviewed patient's current medical records including this morning's H&P, vitals, labs and medications myself.  I have also reviewed patient's recent discharge summary.  I have seen and examined the patient at bedside and plan of care discussed with him.  Currently on 6 L oxygen via nasal cannula.  Will discontinue IV fluids.  Repeat chest x-ray in a.m.  Will monitor inflammatory markers.  Continue steroids and remdesivir.  Continue CBGs with SSI.  Hold long-acting insulin for now.  Might have to restart later if blood sugars remain elevated.  Currently much more awake and answering questions.  Spoke to Dr. Helmut Muster, who follows the patient at [facility] transplant cardiology, on phone on 06/04/2019 and he recommended to check tacrolimus level in a.m. and to monitor for thrombocytopenia.  He also mentioned that patient can be managed in our facility and at this time does not recommend transfer to [facility].  Addendum at 2:08 PM I had ordered repeat inflammatory markers for today which seem to be improving including CRP of 16.1 from 22.6, ferritin of 861 from 1121.  Lactic acid has not worsened.  Procalcitonin is 0.24; was 0.24 prior as well.  Repeated chest x-ray this afternoon shows progressively worsening interstitial infiltrates.  This is progression of viral pneumonia.  I have discontinued antibiotics.  I will give a dose of intravenous Lasix 60 mg.  Spoke with the patient on phone and he stated that his breathing has not gotten any worse; not stated that he was currently on 6 L oxygen.  I explained to the patient on phone and subsequently called and spoke to wife/[name] on phone and told him that I would start Actemra since the patient currently does not have any bacterial  pneumonia and though the chances of having superimposed bacterial infection will be high with Actemra; at this point, the benefit of Actemra outweighs the risk and would help in preventing worsening respiratory status.  They were both agreeable to Actemra.  I have requested pharmacy to dose Actemra.  We will request transfer the patient to [facility].  Addendum 5:05pm Blood culture growing Gram positive cocci in pairs. Will start Rocephin 2gm iv q24 hours. Will discontinue Actemra given recent findings of bacteremia.

## 2019-06-04 NOTE — Progress Notes (Signed)
CRITICAL VALUE ALERT  Critical Value:  Lactic acid 2.6  Date & Time Notied:  02/02/021; 08:23  Provider Notified: Dr. Starla Link  Orders Received/Actions taken:

## 2019-06-04 NOTE — Progress Notes (Signed)
PHARMACY - PHYSICIAN COMMUNICATION CRITICAL VALUE ALERT - BLOOD CULTURE IDENTIFICATION (BCID)  Brandon [name] is an 79 y.o. male who presented to [facility] on 06/18/2019 with a chief complaint of CoVid-19 infection.  Assessment:   2 of 4 BC (1 ana/1aer) + for GPC in pairs  BCID run on 2nd set: staph species, mecA NOT detected  Name of physician (or Provider) Contacted: Dr. Myna Hidalgo  Current antibiotics: Ceftriaxone 2g IV q24h  Changes to prescribed antibiotics recommended:   Cefazolin could be used to narrow tx, but pt has amoxicillin allergy and has tolerated ceftriaxone.   Results for orders placed or performed during the hospital encounter of 06/11/2019  Blood Culture ID Panel (Reflexed) (Collected: 06/04/2019  1:10 AM)  Result Value Ref Range   Enterococcus species NOT DETECTED NOT DETECTED   Listeria monocytogenes NOT DETECTED NOT DETECTED   Staphylococcus species DETECTED (A) NOT DETECTED   Staphylococcus aureus (BCID) NOT DETECTED NOT DETECTED   Methicillin resistance NOT DETECTED NOT DETECTED   Streptococcus species NOT DETECTED NOT DETECTED   Streptococcus agalactiae NOT DETECTED NOT DETECTED   Streptococcus pneumoniae NOT DETECTED NOT DETECTED   Streptococcus pyogenes NOT DETECTED NOT DETECTED   Acinetobacter baumannii NOT DETECTED NOT DETECTED   Enterobacteriaceae species NOT DETECTED NOT DETECTED   Enterobacter cloacae complex NOT DETECTED NOT DETECTED   Escherichia coli NOT DETECTED NOT DETECTED   Klebsiella oxytoca NOT DETECTED NOT DETECTED   Klebsiella pneumoniae NOT DETECTED NOT DETECTED   Proteus species NOT DETECTED NOT DETECTED   Serratia marcescens NOT DETECTED NOT DETECTED   Haemophilus influenzae NOT DETECTED NOT DETECTED   Neisseria meningitidis NOT DETECTED NOT DETECTED   Pseudomonas aeruginosa NOT DETECTED NOT DETECTED   Candida albicans NOT DETECTED NOT DETECTED   Candida glabrata NOT DETECTED NOT DETECTED   Candida krusei NOT DETECTED NOT DETECTED   Candida parapsilosis NOT DETECTED NOT DETECTED   Candida tropicalis NOT DETECTED NOT DETECTED    Despina Pole 06/04/2019  11:53 PM

## 2019-06-04 NOTE — Progress Notes (Signed)
Patient's wife [name] was called and updated on patient's condition. She was informed that patient will be transferred to [facility].

## 2019-06-04 NOTE — Progress Notes (Signed)
Patient was transfer to [facility] by MD order. Patient stable at this time; no acute distress noted, no complaints. Report was given to the nurse who is going to receive the patient. Patient will be transported to the facility via [facility]. Patient's wife is aware about the transfer.

## 2019-06-04 NOTE — Progress Notes (Signed)
Patient's RR elevated in high 20s and low 30s; he is not complaining of any distress. Sat O2 90% on 5 L [place]. MD aware. Will continue to monitor.

## 2019-06-04 NOTE — ED Notes (Signed)
Spoke with Dr. Hal Hope regarding BP.  Orders being placed.  Patient sitting in bed without complaints eating a sandwich meal at this time.

## 2019-06-04 NOTE — Progress Notes (Signed)
Pharmacy Antibiotic Note  Brandon [name] is a 79 y.o. male admitted on 06/19/2019 with weakness/altered mental status.  Pharmacy has been consulted for Vancomycin/Aztreonam dosing for r/o sepsis, unknown source. WBC ok. Renal function worsening. Lactic acid elevated.   Plan: Vancomycin 1000 mg IV q24h >>Estimated AUC: 495 Aztreonam 1g IV q8h Trend WBC, temp, renal function  F/U infectious work-up Drug levels as indicated  Height: 5\' 9"  (175.3 cm) Weight: 200 lb (90.7 kg) IBW/kg (Calculated) : 70.7  Temp (24hrs), Avg:100.2 F (37.9 C), Min:100.2 F (37.9 C), Max:100.2 F (37.9 C)  Recent Labs  Lab 05/30/19 2116 05/30/19 2348 05/31/19 0046 06/01/19 0625 06/18/2019 2257  WBC 4.0  --   --  3.3* 6.7  CREATININE 1.60*  --   --  1.68* 1.94*  LATICACIDVEN  --  1.5 2.2*  --  4.6*    Estimated Creatinine Clearance: 35.5 mL/min (A) (by C-G formula based on SCr of 1.94 mg/dL (H)).    Allergies  Allergen Reactions  . Amoxicillin Swelling and Rash    Did it involve swelling of the face/tongue/throat, SOB, or low BP? No Did it involve sudden or severe rash/hives, skin peeling, or any reaction on the inside of your mouth or nose? No Did you need to seek medical attention at a hospital or doctor's office? Yes When did it last happen?<10 yrs If all above answers are "NO", may proceed with cephalosporin use.   Had to come to hospital after taking it.  . Codeine Nausea And Vomiting   Narda Bonds, PharmD, BCPS Clinical Pharmacist Phone: [phone]

## 2019-06-04 NOTE — Progress Notes (Signed)
PHARMACY - PHYSICIAN COMMUNICATION CRITICAL VALUE ALERT - BLOOD CULTURE IDENTIFICATION (BCID)  Brandon [name] is an 79 y.o. male who presented to [facility] on 06/30/2019 with a chief complaint of covid-19.  Name of physician (or Provider) ContactedStarla Link  Current antibiotics: none  Changes to prescribed antibiotics recommended:  -Add Rocephin 2g IV q24h (MD aware of amoxicillin allergy)  Micro: 1of2 with GPCs in pairs  Arrie Senate, PharmD, BCPS Clinical Pharmacist [phone] Please check AMION for all [place] numbers 06/04/2019

## 2019-06-04 NOTE — H&P (Signed)
History and Physical    Brandon [name] [id number] DOB: 06-27-1940 DOA: 06/14/2019  PCP: Leanna Battles, MD  Patient coming from: Home.  Chief Complaint: Weakness low blood sugar confusion.  History obtained from patient's wife Brandon [name].  HPI: Brandon [name] is a 79 y.o. male with history of cardiac transplant, diabetes mellitus type 2, hypertension, pacemaker, CVA, chronic kidney disease stage III was recently admitted after a fall at that time patient was diagnosed with COVID-19 infection was discharged home after physical therapy on June 01, 2019.  For the last 2 days per patient's wife patient has not been doing well not eating well and had some diarrhea and was increasingly confused.  Patient blood sugar was running low and EMS was called.  After patient was given some food blood sugar improved but since patient was getting more weak he was brought to the ER.  ED Course: In the ER patient was hypotensive with lactic acid of 4.6 CRP 22.6 creatinine worsened to 1.9 from 1.6 sodium 134 procalcitonin 0.19 CBC largely unremarkable except for hemoglobin 17.1 D-dimer 1.83 INR 1.1 UA chest x-ray unremarkable.  CT had unremarkable patient is oriented to his name and place after patient was started on fluids and empiric antibiotics for sepsis.  Prior to that patient was confused.  Patient admitted for sepsis with Covid infection.  EKG shows sinus tachycardia.  Patient's blood pressure improved with fluids and stress dose steroids.  Review of Systems: As per HPI, rest all negative.   Past Medical History:  Diagnosis Date  . Diabetes mellitus without complication ([place])   . Hypertension   . Pacemaker   . Stroke [facility])     Past Surgical History:  Procedure Laterality Date  . CARDIAC SURGERY       reports that he has been smoking. He has never used smokeless tobacco. He reports current alcohol use. He reports that he does not use drugs.  Allergies  Allergen Reactions  .  Amoxicillin Swelling and Rash    Did it involve swelling of the face/tongue/throat, SOB, or low BP? No Did it involve sudden or severe rash/hives, skin peeling, or any reaction on the inside of your mouth or nose? No Did you need to seek medical attention at a hospital or doctor's office? Yes When did it last happen?<10 yrs If all above answers are "NO", may proceed with cephalosporin use.   Had to come to hospital after taking it.  . Codeine Nausea And Vomiting    Family History  Family history unknown: Yes    Prior to Admission medications   Medication Sig Start Date End Date Taking? Authorizing Provider  acetaminophen (TYLENOL) 325 MG tablet Take 650 mg by mouth every 6 (six) hours as needed for moderate pain.    [provider]  allopurinol (ZYLOPRIM) 300 MG tablet Take 300 mg by mouth daily.    [provider]  clopidogrel (PLAVIX) 75 MG tablet Take 75 mg by mouth at bedtime.     [provider]  colchicine 0.6 MG tablet Take 0.6 mg by mouth See admin instructions. Take 1 tablet by mouth daily. If gout flare up take 2 tablets by mouth follow by 1 tablet twice daily for 2 days    [provider]  diphenhydrAMINE (BENADRYL) 25 MG tablet Take 25 mg by mouth at bedtime as needed for sleep.     [provider]  famotidine (PEPCID) 20 MG tablet Take 20 mg by mouth daily at 12  noon.    [provider]  insulin glargine (LANTUS) 100 UNIT/ML injection Inject 24 Units into the skin daily with breakfast.     [provider]  insulin lispro (HUMALOG) 100 UNIT/ML injection Inject 15 Units into the skin daily with lunch.     [provider]  isosorbide mononitrate (IMDUR) 30 MG 24 hr tablet Take 30 mg by mouth daily.    [provider]  latanoprost (XALATAN) 0.005 % ophthalmic solution Place 1 drop into both eyes at bedtime.     [provider]  lisinopril (PRINIVIL,ZESTRIL) 5 MG tablet Take 5 mg by  mouth daily with lunch.     [provider]  mycophenolate (CELLCEPT) 500 MG tablet Take 2 tablets (1,000 mg total) by mouth 2 (two) times daily. Restart on 06/14/2019 06/14/19   Brandon August, MD  niacin (NIASPAN) 500 MG CR tablet Take 500 mg by mouth at bedtime.     [provider]  pravastatin (PRAVACHOL) 20 MG tablet Take 20 mg by mouth at bedtime.     [provider]  predniSONE (DELTASONE) 5 MG tablet Take 5 mg by mouth daily.    [provider]  propranolol (INDERAL) 10 MG tablet Take 10 mg by mouth 2 (two) times daily. Take at lunch and dinner    [provider]  tacrolimus (PROGRAF) 1 MG capsule Take 1 mg by mouth 2 (two) times daily.     [provider]  temazepam (RESTORIL) 30 MG capsule Take 30 mg by mouth at bedtime as needed for sleep.     [provider]    Physical Exam: Constitutional: Moderately built and nourished. Vitals:   06/04/19 0230 06/04/19 0315 06/04/19 0417 06/04/19 0500  BP: (!) 106/58 112/69 95/73   Pulse: 94 95 87   Resp: 20 20 17    Temp:   97.9 F (36.6 C)   TempSrc:   Oral   SpO2: 100% 100% 100%   Weight:    85.3 kg  Height:       Eyes: Anicteric no pallor. ENMT: No discharge from the ears eyes nose or mouth. Neck: No mass or.  No neck rigidity.  No JVD appreciated. Respiratory: No rhonchi or crepitations. Cardiovascular: S1-S2 heard. Abdomen: Soft nontender bowel sounds present. Musculoskeletal: No edema.  No joint effusion. Skin: No rash. Neurologic: Alert awake oriented to his name and place.  Moves all extremities. Psychiatric: Oriented to his name and place.   Labs on Admission: I have personally reviewed following labs and imaging studies  CBC: Recent Labs  Lab 05/30/19 2116 06/01/19 0625 06/06/2019 2257  WBC 4.0 3.3* 6.7  NEUTROABS 2.8 2.3 5.6  HGB 14.7 14.5 17.1*  HCT 44.6 44.6 52.2*  MCV 94.5 93.9 93.2  PLT 98* 86* 0000000   Basic Metabolic Panel: Recent Labs  Lab  05/30/19 2116 06/01/19 0625 06/18/2019 2257  NA 130* 130* 134*  K 4.3 4.2 4.2  CL 95* 97* 98  CO2 20* 20* 22  GLUCOSE 381* 284* 88  BUN 29* 33* 44*  CREATININE 1.60* 1.68* 1.94*  CALCIUM 9.0 8.5* 9.3  MG  --  1.7  --    GFR: Estimated Creatinine Clearance: 34.5 mL/min (A) (by C-G formula based on SCr of 1.94 mg/dL (H)). Liver Function Tests: Recent Labs  Lab 05/30/19 2116 06/01/19 0625 06/12/2019 2257  AST 20 24 27   ALT 24 22 23   ALKPHOS 68 60 66  BILITOT 1.1 1.1 1.4*  PROT 6.8 6.2* 7.6  ALBUMIN 4.2 3.6 4.2   No results for input(s): LIPASE, AMYLASE in the last 168 hours. No results for input(s): AMMONIA in the last 168 hours. Coagulation Profile: Recent Labs  Lab 05/30/19 2348 06/09/2019 2257  INR 1.0 1.1   Cardiac Enzymes: No results for input(s): CKTOTAL, CKMB, CKMBINDEX, TROPONINI in the last 168 hours. BNP (last 3 results) No results for input(s): PROBNP in the last 8760 hours. HbA1C: No results for input(s): HGBA1C in the last 72 hours. CBG: Recent Labs  Lab 05/31/19 1624 05/31/19 2055 06/01/19 0809 06/01/19 1211 06/04/19 0157  GLUCAP 271* 84 348* 245* 138*   Lipid Profile: Recent Labs    06/04/2019 2257  TRIG 129   Thyroid Function Tests: No results for input(s): TSH, T4TOTAL, FREET4, T3FREE, THYROIDAB in the last 72 hours. Anemia Panel: Recent Labs    06/01/19 0625 06/19/2019 2257  FERRITIN 436* 1,121*   Urine analysis:    Component Value Date/Time   COLORURINE YELLOW 06/04/2019 0110   APPEARANCEUR HAZY (A) 06/04/2019 0110   LABSPEC 1.018 06/04/2019 0110   PHURINE 5.0 06/04/2019 0110   GLUCOSEU 150 (A) 06/04/2019 0110   HGBUR SMALL (A) 06/04/2019 0110   BILIRUBINUR NEGATIVE 06/04/2019 0110   KETONESUR NEGATIVE 06/04/2019 0110   PROTEINUR 100 (A) 06/04/2019 0110   NITRITE NEGATIVE 06/04/2019 0110   LEUKOCYTESUR NEGATIVE 06/04/2019 0110   Sepsis Labs: @LABRCNTIP (procalcitonin:4,lacticidven:4) ) Recent Results (from the past 240  hour(s))  Blood Culture (routine x 2)     Status: None (Preliminary result)   Collection Time: 05/30/19 11:45 PM   Specimen: BLOOD  Result Value Ref Range Status   Specimen Description BLOOD LEFT ARM  Final   Special Requests   Final    BOTTLES DRAWN AEROBIC AND ANAEROBIC Blood Culture adequate volume   Culture   Final    NO GROWTH 3 DAYS Performed at [facility], [place] [address] [place], [place] [address]    Report Status PENDING  Incomplete  Blood Culture (routine x 2)     Status: None (Preliminary result)   Collection Time: 05/30/19 11:50 PM   Specimen: BLOOD  Result Value Ref Range Status   Specimen Description BLOOD LEFT HAND  Final   Special Requests   Final    BOTTLES DRAWN AEROBIC ONLY Blood Culture adequate volume   Culture   Final    NO GROWTH 3 DAYS Performed at [facility], [place] [address], [place], [place] [address]    Report Status PENDING  Incomplete  Urine culture     Status: Abnormal   Collection Time: 05/31/19 12:37 AM   Specimen: In/Out Cath Urine  Result Value Ref Range Status   Specimen Description IN/OUT CATH URINE  Final   Special Requests   Final    NONE Performed at [facility], [place] [address], [place], [place] [address]    Culture 20,000 COLONIES/mL ESCHERICHIA COLI (A)  Final   Report Status 06/01/2019 FINAL  Final   Organism ID, Bacteria ESCHERICHIA COLI (A)  Final      Susceptibility   Escherichia coli - MIC*    AMPICILLIN <=2 SENSITIVE Sensitive     CEFAZOLIN <=4 SENSITIVE Sensitive     CEFTRIAXONE <=0.25 SENSITIVE Sensitive     CIPROFLOXACIN <=0.25 SENSITIVE Sensitive     GENTAMICIN <=1 SENSITIVE Sensitive     IMIPENEM <=0.25 SENSITIVE Sensitive     NITROFURANTOIN <=16 SENSITIVE Sensitive     TRIMETH/SULFA <=20 SENSITIVE Sensitive  AMPICILLIN/SULBACTAM <=2 SENSITIVE Sensitive     PIP/TAZO <=4 SENSITIVE Sensitive     * 20,000 COLONIES/mL ESCHERICHIA COLI     Radiological Exams on Admission: CT Head Wo  Contrast  Result Date: 06/04/2019 CLINICAL DATA:  Encephalopathy. COVID-19. EXAM: CT HEAD WITHOUT CONTRAST TECHNIQUE: Contiguous axial images were obtained from the base of the skull through the vertex without intravenous contrast. COMPARISON:  05/30/2019 FINDINGS: Brain: There is no mass, hemorrhage or extra-axial collection. There is generalized atrophy without lobar predilection. Hypodensity of the white matter is most commonly associated with chronic microvascular disease. Old right frontal lobe infarct, unchanged. Vascular: No abnormal hyperdensity of the major intracranial arteries or dural venous sinuses. No intracranial atherosclerosis. Skull: The visualized skull base, calvarium and extracranial soft tissues are normal. Sinuses/Orbits: No fluid levels or advanced mucosal thickening of the visualized paranasal sinuses. No mastoid or middle ear effusion. The orbits are normal. 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 Electronically Signed   By: Ulyses Jarred M.D.   On: 06/04/2019 00:18   DG Chest Port 1 View  Result Date: 06/11/2019 CLINICAL DATA:  Hypoglycemia EXAM: PORTABLE CHEST 1 VIEW COMPARISON:  05/30/2019 FINDINGS: Cardiac shadow is enlarged but stable. Pacing device is again seen as well as postsurgical changes. Scarring is noted in the left lung. No focal infiltrate or sizable effusion is seen. Changes consistent with prior granulomatous disease are noted. No acute abnormality is seen. IMPRESSION: No acute abnormality noted.  Chronic changes as described. Electronically Signed   By: Inez Catalina M.D.   On: 06/23/2019 23:23    EKG: Independently reviewed.  Sinus tachycardia.  Assessment/Plan Principal Problem:   Sepsis ([place]) Active Problems:   Heart transplanted ([place])   CAD (coronary artery disease)   Essential hypertension   Type 2 diabetes mellitus with stage 3 chronic kidney disease (H[facility])    Encephalopathy   COVID-19   Acute kidney injury superimposed on chronic kidney disease ([place])    1. Sepsis with COVID-19 infection presently given immunosuppressed state patient is on empiric antibiotics and will also start patient on IV remdesivir and IV Decadron.  Follow lactate levels continue with hydration follow respiratory status. 2. Cardiac transplant -presently on Prograf and prednisone.  As per the recent discussion with patient's transplant team by discharging MD 2 days ago mycophenolate is to be held for 2 weeks and to be restarted on June 14, 2019.  I had ordered 1 dose of IV hydrocortisone and presently on IV Decadron for Covid. 3. Acute encephalopathy likely from sepsis and hypoglycemia and metabolic derangements.  CT head unremarkable.  Closely monitor. 4. Diabetes mellitus type 2 with hyperglycemia recent globin A1c was 8.3 done last week.  Presently on IV steroids.  Will hold off Lantus and sliding scale insulin for now I am keeping patient on CBG checks every 2 hours until blood sugar improves and is steady. 5. Acute on chronic kidney disease stage III likely from dehydration diarrhea and sepsis.  Holding patient's lisinopril continue hydration follow metabolic panel intake output. 6. Hypertension presently holding antihypertensives due to hypotensive and septic picture.  Given the septic patient patient will need inpatient status.   DVT prophylaxis: Heparin. Code Status: Full code confirmed with patient's wife. Family Communication: Patient's wife. Disposition Plan: Home when stable. Consults called: None. Admission status: Inpatient.   Rise Patience MD [facility] Pager [phone].  If 7PM-7AM, please contact night-coverage [email or web address] Password [facility]  06/04/2019, 6:04 AM

## 2019-06-05 ENCOUNTER — Inpatient Hospital Stay (HOSPITAL_COMMUNITY): Payer: PRIVATE HEALTH INSURANCE | Attending: Internal Medicine

## 2019-06-05 DIAGNOSIS — R7881 Bacteremia: Secondary | ICD-10-CM

## 2019-06-05 LAB — HEMOGLOBIN A1C
Hgb A1c MFr Bld: 9 % — ABNORMAL HIGH (ref 4.8–5.6)
Mean Plasma Glucose: 211.6 mg/dL

## 2019-06-05 LAB — CBC
HCT: 40.5 % (ref 39.0–52.0)
Hemoglobin: 13.5 g/dL (ref 13.0–17.0)
MCH: 30.6 pg (ref 26.0–34.0)
MCHC: 33.3 g/dL (ref 30.0–36.0)
MCV: 91.8 fL (ref 80.0–100.0)
Platelets: 134 10*3/uL — ABNORMAL LOW (ref 150–400)
RBC: 4.41 MIL/uL (ref 4.22–5.81)
RDW: 12.5 % (ref 11.5–15.5)
WBC: 5 10*3/uL (ref 4.0–10.5)
nRBC: 0 % (ref 0.0–0.2)

## 2019-06-05 LAB — CULTURE, BLOOD (ROUTINE X 2)
Culture: NO GROWTH
Culture: NO GROWTH
Special Requests: ADEQUATE
Special Requests: ADEQUATE

## 2019-06-05 LAB — GLUCOSE, CAPILLARY
Glucose-Capillary: 270 mg/dL — ABNORMAL HIGH (ref 70–99)
Glucose-Capillary: 354 mg/dL — ABNORMAL HIGH (ref 70–99)
Glucose-Capillary: 156 mg/dL — ABNORMAL HIGH (ref 70–99)

## 2019-06-05 LAB — ECHOCARDIOGRAM COMPLETE
Height: 69 in
Weight: 3008.84 oz

## 2019-06-05 LAB — FERRITIN: Ferritin: 857 ng/mL — ABNORMAL HIGH (ref 24–336)

## 2019-06-05 LAB — D-DIMER, QUANTITATIVE: D-Dimer, Quant: 1.65 ug/mL-FEU — ABNORMAL HIGH (ref 0.00–0.50)

## 2019-06-05 LAB — C-REACTIVE PROTEIN: CRP: 12.4 mg/dL — ABNORMAL HIGH (ref <1.0)

## 2019-06-05 MED ORDER — INSULIN ASPART 100 UNIT/ML ~~LOC~~ SOLN
5.0000 [IU] | Freq: Three times a day (TID) | SUBCUTANEOUS | Status: DC
  Administered 2019-06-05 – 2019-06-06 (×4): 5 [IU] via SUBCUTANEOUS

## 2019-06-05 MED ORDER — INSULIN DETEMIR 100 UNIT/ML ~~LOC~~ SOLN
25.0000 [IU] | Freq: Every day | SUBCUTANEOUS | Status: DC
  Administered 2019-06-06 – 2019-06-08 (×3): 25 [IU] via SUBCUTANEOUS
  Filled 2019-06-05 (×3): qty 0.25

## 2019-06-05 NOTE — Progress Notes (Signed)
PROGRESS NOTE    Brandon [name]  [id number] DOB: [birth year] DOA: 06/18/2019 PCP: Brandon Battles, MD    Brief Narrative:  Brandon [name] is a 79 y.o. male with history of cardiac transplant, diabetes mellitus type 2, hypertension, pacemaker, CVA, chronic kidney disease stage III was recently admitted after a fall at that time patient was diagnosed with COVID-19 infection was discharged home after physical therapy on June 01, 2019.  For the last 2 days per patient's wife patient has not been doing well not eating well and had some diarrhea and was increasingly confused.  Patient blood sugar was running low and EMS was called.  After patient was given some food blood sugar improved but since patient was getting more weak he was brought to the ER.  In the ER patient was hypotensive with lactic acid of 4.6 CRP 22.6 creatinine worsened to 1.9 from 1.6 sodium 134 procalcitonin 0.19 CBC largely unremarkable except for hemoglobin 17.1 D-dimer 1.83 INR 1.1 UA chest x-ray unremarkable.  CT head unremarkable. Patient is oriented to his name and place after patient was started on fluids and empiric antibiotics for sepsis.  Prior to that patient was confused.  Patient admitted for sepsis with Covid infection.  EKG shows sinus tachycardia. Patient's blood pressure improved with fluids and stress dose steroids.   Assessment & Plan:   Principal Problem:   Sepsis ([place]) Active Problems:   Heart transplanted ([place])   CAD (coronary artery disease)   Essential hypertension   Type 2 diabetes mellitus with stage 3 chronic kidney disease (HCC)   Encephalopathy   COVID-19   Acute kidney injury superimposed on chronic kidney disease ([place])   Acute metabolic encephalopathy Etiology likely multifactorial given staph aureus septicemia combined with hypoxic respiratory failure secondary to Covid-19 viral pneumonia.  CT head with chronic ischemic microangiopathy and old right frontal lobe infarct without acute  intracranial abnormality.  Continue treatment with ceftriaxone for septicemia and remdesivir/steroids for Covid-pneumonia as below.  Sepsis, present on admission Lactic acidosis Staph aureus septicemia Blood cultures 2 out of 4 positive for GPC's.  Complicated by history of pacemaker placement.  Discussed with infectious disease, Dr. Megan Salon on 06/05/2019, and believes he could have a true bacteremia with methicillin sensitive coagulase-negative Staphylococcus. --Lactic acid 4.6-->1.6 --Continue ceftriaxone 2 g IV every 24 hours --TTE pending --Awaiting for full speciation and antibiotic susceptibility on the 2 isolates to see if they are the same; which would support true bacteremia or different which would indicate insignificant contaminants.   Acute hypoxic respiratory failure secondary to acute Covid-19 viral pneumonia during the ongoing 2020 Covid 19 Pandemic - POA Diagnosed 05/31/2019.  Patient presents with progressive dyspnea, found to be hypoxic on room air.  0.83, fibrinogen 750, CRP 22.6.  Chest x-ray with progressive bibasilar interstitial and groundglass opacities consistent with multifocal pneumonia. --ddimer 1.83-->3.60-->1.65 --CRP 22.6-->16.1-->12.4 --Continue remdesivir, plan 5-day course; day #2/5 --Continue Solu-Medrol 60 mg every 12 hours --Continue supplemental oxygen, titrate to maintain SPO2 greater than 92%; currently on 5 L heated high flow nasal cannula --Continue supportive care with vitamin C, zinc, Tylenol, Dulera MDI --Follow CBC, CMP, D-dimer, ferritin, and CRP daily --Continue airborne/contact isolation precautions  History of cardiac transplant Patient on mycophenolate 1000 mg p.o. twice daily, prednisone 5 mg p.o. daily, tacrolimus 1 mg p.o. twice daily at home. --Continue tacrolimus 1 mg p.o. BID --On high-dose steroids as above for Covid-19 pneumonia --Holding mycophenolate per transplant team until restarts on 06/14/2019  Type 2 diabetes  mellitus  Hemoglobin A1c 9.0, not optimally controlled.  On Lantus 24 units subcutaneously daily, Humalog 15 units subcutaneously daily with lunch. --Increase Levemir to 25 units subcutaneously daily today --Start NovoLog 5 units subcutaneously 3 times daily AC --Insulin sliding scale for further coverage  Acute on chronic kidney disease stage III Baseline creatinine 1.6, creatinine 2.0 on admission --Continue to hold home lisinopril --Avoid nephrotoxins, renally dose all medications --Monitor renal function daily  Essential hypertension On lisinopril 5 mg p.o. daily, propranolol 10 mg p.o. twice daily at home.  Patient was hypotensive on presentation. --BP 140/82 today --Restart home propranolol 10 mg p.o. twice daily today --Continue to hold home lisinopril for now --Monitor blood pressure closely  Weakness, debility: --PT/OT evaluation   DVT prophylaxis: Heparin Code Status: Full code Family Communication: Discussed with patient extensively at bedside Disposition Plan: From home with spouse, to be determined, awaiting PT/OT evaluation, awaiting further identification and susceptibility testing from blood cultures, continues on heated high flow nasal cannula   Consultants:   ID - discussed with Dr. Megan Salon on 06/05/2019  Procedures:   TTE: Pending  Antimicrobials:   Remdesivir 2   Subjective: Patient seen and examined bedside, resting comfortably.  Slightly confused.  Continues with shortness of breath, reports slightly improved.  No other significant planes at this time.  Denies fever/chills/night sweats, no nausea/vomiting/diarrhea, no chest pain, no palpitations, no abdominal pain.  No acute events overnight per nursing staff.  Objective: Vitals:   06/05/19 0724 06/05/19 0725 06/05/19 1159 06/05/19 1212  BP:   135/86   Pulse:  99 (!) 107   Resp: (!) 28 (!) 21 (!) 29 (!) 22  Temp:   97.9 F (36.6 C)   TempSrc:   Oral   SpO2: (!) 85% 93% 97% 90%  Weight:       Height:        Intake/Output Summary (Last 24 hours) at 06/05/2019 1342 Last data filed at 06/05/2019 1230 Gross per 24 hour  Intake 500 ml  Output 1750 ml  Net -1250 ml   Filed Weights   06/07/2019 2239 06/04/19 0500  Weight: 90.7 kg 85.3 kg    Examination:  General exam: Appears calm and comfortable  Respiratory system: Clear to auscultation. Respiratory effort normal.  On 5 L heated high flow nasal cannula Cardiovascular system: S1 & S2 heard, RRR. No JVD, murmurs, rubs, gallops or clicks. No pedal edema. Gastrointestinal system: Abdomen is nondistended, soft and nontender. No organomegaly or masses felt. Normal bowel sounds heard. Central nervous system: Alert, oriented to place Brandon [name]) and time (2021) but not person (president - unknown). No focal neurological deficits. Extremities: Symmetric 5 x 5 power. Skin: No rashes, lesions or ulcers Psychiatry: Judgement and insight appear poor. Mood & affect appropriate.     Data Reviewed: I have personally reviewed following labs and imaging studies  CBC: Recent Labs  Lab 05/30/19 2116 06/01/19 0625 06/29/2019 2257 06/04/19 0444 06/05/19 0205  WBC 4.0 3.3* 6.7 4.5 5.0  NEUTROABS 2.8 2.3 5.6 3.7  --   HGB 14.7 14.5 17.1* 13.2 13.5  HCT 44.6 44.6 52.2* 39.2 40.5  MCV 94.5 93.9 93.2 92.9 91.8  PLT 98* 86* 162 107* [id number]*   Basic Metabolic Panel: Recent Labs  Lab 05/30/19 2116 06/01/19 0625 06/08/2019 2257 06/04/19 0444  NA 130* 130* 134* 135  K 4.3 4.2 4.2 4.1  CL 95* 97* 98 104  CO2 20* 20* 22 18*  GLUCOSE 381* 284* 88 200*  BUN 29* 33* 44* 44*  CREATININE 1.60* 1.68* 1.94* 2.02*  CALCIUM 9.0 8.5* 9.3 7.6*  MG  --  1.7  --   --    GFR: Estimated Creatinine Clearance: 33.1 mL/min (A) (by C-G formula based on SCr of 2.02 mg/dL (H)). Liver Function Tests: Recent Labs  Lab 05/30/19 2116 06/01/19 0625 06/22/2019 2257 06/04/19 0444  AST 20 24 27 19   ALT 24 22 23 19   ALKPHOS 68 60 66 48  BILITOT 1.1 1.1 1.4* 0.7   PROT 6.8 6.2* 7.6 5.6*  ALBUMIN 4.2 3.6 4.2 3.0*   No results for input(s): LIPASE, AMYLASE in the last 168 hours. No results for input(s): AMMONIA in the last 168 hours. Coagulation Profile: Recent Labs  Lab 05/30/19 2348 06/23/2019 2257  INR 1.0 1.1   Cardiac Enzymes: No results for input(s): CKTOTAL, CKMB, CKMBINDEX, TROPONINI in the last 168 hours. BNP (last 3 results) No results for input(s): PROBNP in the last 8760 hours. HbA1C: Recent Labs    06/05/19 0205  HGBA1C 9.0*   CBG: Recent Labs  Lab 06/04/19 1240 06/04/19 1626 06/04/19 2241 06/05/19 0732 06/05/19 1158  GLUCAP 418* 397* 155* 270* 354*   Lipid Profile: Recent Labs    06/17/2019 2257  TRIG 129   Thyroid Function Tests: No results for input(s): TSH, T4TOTAL, FREET4, T3FREE, THYROIDAB in the last 72 hours. Anemia Panel: Recent Labs    06/04/19 1155 06/05/19 0205  FERRITIN 861* 857*   Sepsis Labs: Recent Labs  Lab 06/09/2019 2257 06/20/2019 2257 06/04/19 0110 06/04/19 0444 06/04/19 0706 06/04/19 1155  PROCALCITON 0.19  --   --  0.24  --  0.24  LATICACIDVEN 4.6*   < > 2.7* 1.7 2.6* 1.6   < > = values in this interval not displayed.    Recent Results (from the past 240 hour(s))  Blood Culture (routine x 2)     Status: None   Collection Time: 05/30/19 11:45 PM   Specimen: BLOOD  Result Value Ref Range Status   Specimen Description BLOOD LEFT ARM  Final   Special Requests   Final    BOTTLES DRAWN AEROBIC AND ANAEROBIC Blood Culture adequate volume   Culture   Final    NO GROWTH 5 DAYS Performed at [facility], [address], [place], [place] [address]    Report Status 06/05/2019 FINAL  Final  Blood Culture (routine x 2)     Status: None   Collection Time: 05/30/19 11:50 PM   Specimen: BLOOD  Result Value Ref Range Status   Specimen Description BLOOD LEFT HAND  Final   Special Requests   Final    BOTTLES DRAWN AEROBIC ONLY Blood Culture adequate volume   Culture   Final    NO  GROWTH 5 DAYS Performed at [facility], [place] [address], [place], [place] [address]    Report Status 06/05/2019 FINAL  Final  Urine culture     Status: Abnormal   Collection Time: 05/31/19 12:37 AM   Specimen: In/Out Cath Urine  Result Value Ref Range Status   Specimen Description IN/OUT CATH URINE  Final   Special Requests   Final    NONE Performed at [facility], [place] [address], [place], [place] [address]    Culture 20,000 COLONIES/mL ESCHERICHIA COLI (A)  Final   Report Status 06/01/2019 FINAL  Final   Organism ID, Bacteria ESCHERICHIA COLI (A)  Final      Susceptibility   Escherichia coli - MIC*  AMPICILLIN <=2 SENSITIVE Sensitive     CEFAZOLIN <=4 SENSITIVE Sensitive     CEFTRIAXONE <=0.25 SENSITIVE Sensitive     CIPROFLOXACIN <=0.25 SENSITIVE Sensitive     GENTAMICIN <=1 SENSITIVE Sensitive     IMIPENEM <=0.25 SENSITIVE Sensitive     NITROFURANTOIN <=16 SENSITIVE Sensitive     TRIMETH/SULFA <=20 SENSITIVE Sensitive     AMPICILLIN/SULBACTAM <=2 SENSITIVE Sensitive     PIP/TAZO <=4 SENSITIVE Sensitive     * 20,000 COLONIES/mL ESCHERICHIA COLI  Culture, blood (Routine x 2)     Status: None (Preliminary result)   Collection Time: 06/23/2019 10:57 PM   Specimen: BLOOD  Result Value Ref Range Status   Specimen Description BLOOD LEFT ARM  Final   Special Requests   Final    BOTTLES DRAWN AEROBIC AND ANAEROBIC Blood Culture adequate volume   Culture  Setup Time   Final    GRAM POSITIVE COCCI IN PAIRS IN BOTH AEROBIC AND ANAEROBIC BOTTLES CRITICAL RESULT CALLED TO, READ BACK BY AND VERIFIED WITH: [place] [id number] 06/04/19 A BROWNING Performed at [facility], [place] [address], [place], [place] [address]    Culture GRAM POSITIVE COCCI  Final   Report Status PENDING  Incomplete  Urine culture     Status: Abnormal   Collection Time: 06/30/2019 11:38 PM   Specimen: Urine, Clean Catch  Result Value Ref Range Status   Specimen Description URINE, CLEAN CATCH   Final   Special Requests   Final    NONE Performed at [facility], [place] [address], [place], [place] [address]    Culture MULTIPLE SPECIES PRESENT, SUGGEST RECOLLECTION (A)  Final   Report Status 06/04/2019 FINAL  Final  Culture, blood (Routine x 2)     Status: Abnormal (Preliminary result)   Collection Time: 06/04/19  1:10 AM   Specimen: BLOOD  Result Value Ref Range Status   Specimen Description BLOOD RIGHT FOREARM  Final   Special Requests   Final    BOTTLES DRAWN AEROBIC AND ANAEROBIC Blood Culture adequate volume   Culture  Setup Time   Final    ANAEROBIC BOTTLE ONLY GRAM POSITIVE COCCI CRITICAL RESULT CALLED TO, READ BACK BY AND VERIFIED WITH: T MEYER PHARMD 06/04/19 2254 JDW    Culture (A)  Final    STAPHYLOCOCCUS EPIDERMIDIS SUSCEPTIBILITIES TO FOLLOW Performed at [facility], [place] [address] [place], [place] [address]    Report Status PENDING  Incomplete  Blood Culture ID Panel (Reflexed)     Status: Abnormal   Collection Time: 06/04/19  1:10 AM  Result Value Ref Range Status   Enterococcus species NOT DETECTED NOT DETECTED Final   Listeria monocytogenes NOT DETECTED NOT DETECTED Final   Staphylococcus species DETECTED (A) NOT DETECTED Final    Comment: Methicillin (oxacillin) susceptible coagulase negative staphylococcus. Possible blood culture contaminant (unless isolated from more than one blood culture draw or clinical case suggests pathogenicity). No antibiotic treatment is indicated for blood  culture contaminants. CRITICAL RESULT CALLED TO, READ BACK BY AND VERIFIED WITH: T MEYER PHARMD 06/04/19 2254 JDW    Staphylococcus aureus (BCID) NOT DETECTED NOT DETECTED Final   Methicillin resistance NOT DETECTED NOT DETECTED Final   Streptococcus species NOT DETECTED NOT DETECTED Final   Streptococcus agalactiae NOT DETECTED NOT DETECTED Final   Streptococcus pneumoniae NOT DETECTED NOT DETECTED Final   Streptococcus pyogenes NOT DETECTED NOT DETECTED Final    Acinetobacter baumannii NOT DETECTED NOT DETECTED Final  Enterobacteriaceae species NOT DETECTED NOT DETECTED Final   Enterobacter cloacae complex NOT DETECTED NOT DETECTED Final   Escherichia coli NOT DETECTED NOT DETECTED Final   Klebsiella oxytoca NOT DETECTED NOT DETECTED Final   Klebsiella pneumoniae NOT DETECTED NOT DETECTED Final   Proteus species NOT DETECTED NOT DETECTED Final   Serratia marcescens NOT DETECTED NOT DETECTED Final   Haemophilus influenzae NOT DETECTED NOT DETECTED Final   Neisseria meningitidis NOT DETECTED NOT DETECTED Final   Pseudomonas aeruginosa NOT DETECTED NOT DETECTED Final   Candida albicans NOT DETECTED NOT DETECTED Final   Candida glabrata NOT DETECTED NOT DETECTED Final   Candida krusei NOT DETECTED NOT DETECTED Final   Candida parapsilosis NOT DETECTED NOT DETECTED Final   Candida tropicalis NOT DETECTED NOT DETECTED Final    Comment: Performed at [facility], [place] [address], [place], [place] [address]  MRSA PCR Screening     Status: None   Collection Time: 06/04/19  8:35 AM   Specimen: Nasopharyngeal  Result Value Ref Range Status   MRSA by PCR NEGATIVE NEGATIVE Final    Comment:        The GeneXpert MRSA Assay (FDA approved for NASAL specimens only), is one component of a comprehensive MRSA colonization surveillance program. It is not intended to diagnose MRSA infection nor to guide or monitor treatment for MRSA infections. Performed at [facility], [place] [address] [place], [place] [address]          Radiology Studies: CT Head Wo Contrast  Result Date: 06/04/2019 CLINICAL DATA:  Encephalopathy. COVID-19. EXAM: CT HEAD WITHOUT CONTRAST TECHNIQUE: Contiguous axial images were obtained from the base of the skull through the vertex without intravenous contrast. COMPARISON:  05/30/2019 FINDINGS: Brain: There is no mass, hemorrhage or extra-axial collection. There is generalized atrophy without lobar predilection. Hypodensity  of the white matter is most commonly associated with chronic microvascular disease. Old right frontal lobe infarct, unchanged. Vascular: No abnormal hyperdensity of the major intracranial arteries or dural venous sinuses. No intracranial atherosclerosis. Skull: The visualized skull base, calvarium and extracranial soft tissues are normal. Sinuses/Orbits: No fluid levels or advanced mucosal thickening of the visualized paranasal sinuses. No mastoid or middle ear effusion. The orbits are normal. 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 Electronically Signed   By: Ulyses Jarred M.D.   On: 06/04/2019 00:18   DG Chest Port 1 View  Result Date: 06/04/2019 CLINICAL DATA:  Dyspnea EXAM: PORTABLE CHEST 1 VIEW COMPARISON:  06/25/2019 FINDINGS: Single frontal view of the chest demonstrates stable dual lead pacemaker. Postsurgical changes from median sternotomy. Cardiac silhouette is unchanged. Since 05/30/2019, there has been progression of basilar predominant interstitial and ground-glass opacities, most pronounced at the right lung base. This is superimposed upon chronic scarring at the left lung base. There is chronic elevation of the left hemidiaphragm. No effusion or pneumothorax. IMPRESSION: 1. Progressive bibasilar interstitial and ground-glass opacities as above. Differential includes interstitial edema versus atypical multifocal pneumonia. Electronically Signed   By: Randa Ngo M.D.   On: 06/04/2019 13:13   DG Chest Port 1 View  Result Date: 06/26/2019 CLINICAL DATA:  Hypoglycemia EXAM: PORTABLE CHEST 1 VIEW COMPARISON:  05/30/2019 FINDINGS: Cardiac shadow is enlarged but stable. Pacing device is again seen as well as postsurgical changes. Scarring is noted in the left lung. No focal infiltrate or sizable effusion is seen. Changes consistent with prior granulomatous disease are noted. No acute abnormality is  seen. IMPRESSION: No  acute abnormality noted.  Chronic changes as described. Electronically Signed   By: Inez Catalina M.D.   On: 06/16/2019 23:23        Scheduled Meds:  allopurinol  300 mg Oral Daily   vitamin C  500 mg Oral Daily   clopidogrel  75 mg Oral QHS   heparin  5,000 Units Subcutaneous Q8H   insulin aspart  0-15 Units Subcutaneous TID WC   insulin aspart  0-5 Units Subcutaneous QHS   insulin aspart  5 Units Subcutaneous TID WC   [START ON 06/06/2019] insulin detemir  25 Units Subcutaneous Daily   Ipratropium-Albuterol  1 puff Inhalation Q6H   latanoprost  1 drop Both Eyes QHS   methylPREDNISolone (SOLU-MEDROL) injection  60 mg Intravenous Q12H   mometasone-formoterol  2 puff Inhalation BID   [START ON 06/14/2019] mycophenolate  1,000 mg Oral BID   pravastatin  20 mg Oral QHS   propranolol  10 mg Oral BID AC   tacrolimus  1 mg Oral BID   zinc sulfate  220 mg Oral Daily   Continuous Infusions:  cefTRIAXone (ROCEPHIN)  IV 2 g (06/04/19 1719)   remdesivir 100 mg in NS 100 mL Stopped (06/05/19 1318)     LOS: 1 day    Time spent: 39 minutes spent on chart review, discussion with nursing staff, consultants, updating family and interview/physical exam; more than 50% of that time was spent in counseling and/or coordination of care.    Karess Harner J [place] DO [facility] Available via Epic secure chat 7am-7pm After these hours, please refer to coverage provider listed on [email or web address] 06/05/2019, 1:42 PM

## 2019-06-05 NOTE — Progress Notes (Signed)
Echo attempted.  OT set to work w patient

## 2019-06-05 NOTE — Progress Notes (Signed)
  Echocardiogram 2D Echocardiogram has been performed.  Brandon [name] 06/05/2019, 4:18 PM

## 2019-06-05 NOTE — Consult Note (Signed)
         [facility] for Infectious Disease    Date of Admission:  06/20/2019    Day 3 remdesivir and steroids        Day 2 ceftriaxone  Brandon [name] is a 79 year old with a history of heart transplant, diabetes, hypertension, permanent pacemaker and chronic renal insufficiency who was admitted recently after a fall.  He was found to have COVID-19 but was able to be discharged home fairly quickly.  However, he became weaker and more confused leading to readmission on 06/22/2019.  He had low-grade fever at that time.  Blood cultures are positive.  I spoke with the lab and one set is growing methicillin susceptible coag negative staph and the second set is growing viridans strep.  While it is possible that both of these may represent insignificant contaminants I would certainly continue ceftriaxone given the fact that he has a permanent pacemaker.  I will repeat blood cultures and order an echocardiogram.         Michel Bickers, MD [facility] for [place] [phone] pager   [phone] cell 06/05/2019, 2:22 PM

## 2019-06-05 NOTE — Progress Notes (Signed)
Occupational Therapy Evaluation Patient Details Name: Brandon [name] MRN: [id number] DOB: [birth year] Today's Date: 06/05/2019    History of Present Illness Brandon [name] is a 79 y.o.malewithhistory of cardiac transplant, diabetes mellitus type 2, hypertension, pacemaker, CVA, chronic kidney disease stage III was recently admitted after a fall at that time patient was diagnosed with COVID-19 infection was discharged home after physical therapy on June 01, 2019. For the last 2 days per patient's wife patient has not been doing well not eating well and had some diarrhea and was increasingly confused. Patient blood sugar was running low and EMS was called. After patient was given some food blood sugar improved but since patient was getting more weak he was brought to the ER. In the ER patient was hypotensive with lactic acid of 4.6 CRP 22.6 creatinine worsened to 1.9 from 1.6 sodium 134 procalcitonin 0.19 CBC largely unremarkable except for hemoglobin 17.1 D-dimer 1.83 INR 1.1 UA chest x-ray unremarkable. CT head unremarkable. Patient is oriented to his name and place after patient was started on fluids and empiric antibiotics for sepsis. Prior to that patient was confused. Patient admitted for sepsis with Covid infection. EKG shows sinus tachycardia. Patient's blood pressure improved with fluids and stress dose steroids.   Clinical Impression   PTA pt resided with wife, independent in ADL and mobility tasks. Pt does not ambulate with an assistive device and reports 6 falls in the last 6 months. Pt does not use oxygen at home and is currently on 7L HFNC with SpO2 93% at rest. Pt tolerated sitting edge of bed 10+ min with supervision while engaging in self-care tasks. Pt able to stand and transfer to bedside chair with RW and min assist. Noted 0 instances of loss of balance, however pt unsteady on feet. Pt reported increased dizziness with activity and mod shortness of breath. SpO2 decreased to  78% with mobility with BP: 144/33mmHg. Pt required ~4 min seated rest break to return to 90%. Educated/instructed pt on breathing strategies with poor understanding and follow through. Pt demonstrates decreased cognition, strength, endurance, balance, standing tolerance, activity tolerance, and safety awareness impacting ability to complete self-care and functional transfer tasks. Recommend skilled OT services to address above deficits in order to promote function and prevent further decline. Pay may benefit from additional rehab prior to discharge home.    Follow Up Recommendations  SNF;Supervision/Assistance - 24 hour(May progress to only needing HH OT and 24/7 assist)    Equipment Recommendations  None recommended by OT    Recommendations for Other Services       Precautions / Restrictions Precautions Precautions: Fall;Other (comment) Precaution Comments: Monitor SpO2, desats quickly. 7L HFNC Restrictions Weight Bearing Restrictions: No      Mobility Bed Mobility Overal bed mobility: Needs Assistance Bed Mobility: Supine to Sit     Supine to sit: Supervision     General bed mobility comments: HOB elevated, use of bedrail. Supervision for safety and line management.  Transfers Overall transfer level: Needs assistance Equipment used: Rolling walker (2 wheeled) Transfers: Sit to/from [facility] Sit to Stand: Min assist Stand pivot transfers: Min assist       General transfer comment: Noted 0 instances of LOB, however pt unsteady on feet    Balance Overall balance assessment: Needs assistance Sitting-balance support: Feet supported Sitting balance-Leahy Scale: Good       Standing balance-Leahy Scale: Poor  ADL either performed or assessed with clinical judgement   ADL Overall ADL's : Needs assistance/impaired Eating/Feeding: Set up;Sitting   Grooming: Set up;Supervision/safety;Sitting   Upper Body  Bathing: Set up;Supervision/ safety;Sitting   Lower Body Bathing: Minimal assistance;Sit to/from stand;Sitting/lateral leans   Upper Body Dressing : Supervision/safety;Set up;Sitting   Lower Body Dressing: Minimal assistance;Sit to/from stand;Sitting/lateral leans   Toilet Transfer: Minimal assistance;BSC   Toileting- Clothing Manipulation and Hygiene: Minimal assistance;Sit to/from stand;Sitting/lateral lean       Functional mobility during ADLs: Minimal assistance;Rolling walker General ADL Comments: Pt able to ambulate 4 feet over to bedside chair with RW. Pt required min assist. Noted 0 instances of LOB, however pt unsteady on feet.      Vision Baseline Vision/History: Wears glasses Wears Glasses: Reading only       Perception     Praxis      Pertinent Vitals/Pain Pain Assessment: No/denies pain     Hand Dominance Left   Extremity/Trunk Assessment Upper Extremity Assessment Upper Extremity Assessment: Overall WFL for tasks assessed(However pt has BUE tremors)   Lower Extremity Assessment Lower Extremity Assessment: Defer to PT evaluation       Communication Communication Communication: HOH   Cognition Arousal/Alertness: Awake/alert Behavior During Therapy: Restless Overall Cognitive Status: No family/caregiver present to determine baseline cognitive functioning                                 General Comments: Pt A&O x 4, however appears confused   General Comments  Pt on 7L HFNC with SpO2 93% at rest. SpO2 decreased to 78% following transfer to chair with pt requiring ~4 min seated recovery to return to 90%. Pt reported increased dizziness during activity. BP 144/46mmHg.      Exercises Exercises: Other exercises Other Exercises Other Exercises: Encouraged pursed lip breathing throughout Other Exercises: Incentive spirometer x 5 with min cues on technique. Pt pulling 815mL.   Shoulder Instructions      Home Living Family/patient expects  to be discharged to:: Private residence Living Arrangements: Spouse/significant other Available Help at Discharge: Family;Available 24 hours/day Type of Home: House Home Access: Level entry     Home Layout: One level     Bathroom Shower/Tub: Occupational psychologist: Handicapped height     Home Equipment: Environmental consultant - 4 wheels;Cane - single point;Shower seat;Grab bars - toilet;Grab bars - tub/shower   Additional Comments: PLOF obtained from previous admission and pt report      Prior Functioning/Environment Level of Independence: Independent        Comments: Pt independent with ADLs and mobility. Pt does not ambulate with an assistive device and reports 6 falls in the last 6 months. Pt reports that he and his wife share IADL responsibilities. Pt does not use oxygen at home.         OT Problem List: Decreased strength;Decreased activity tolerance;Impaired balance (sitting and/or standing);Decreased cognition;Decreased safety awareness;Cardiopulmonary status limiting activity      OT Treatment/Interventions: Self-care/ADL training;Therapeutic exercise;Neuromuscular education;DME and/or AE instruction;Energy conservation;Therapeutic activities;Patient/family education;Balance training    OT Goals(Current goals can be found in the care plan section) Acute Rehab OT Goals Patient Stated Goal: to go home Time For Goal Achievement: 06/19/19 Potential to Achieve Goals: Good ADL Goals Pt Will Perform Grooming: with modified independence;standing Pt Will Perform Lower Body Bathing: with modified independence;sit to/from stand Pt Will Perform Lower Body Dressing: with modified independence;sit to/from stand  Pt Will Transfer to Toilet: with modified independence;ambulating;regular height toilet Pt Will Perform Toileting - Clothing Manipulation and hygiene: with modified independence;sit to/from stand Additional ADL Goal #1: Pt to recall and verbalize 3 fall prevention strategies  with min verbal cues. Additional ADL Goal #2: Pt to tolerate standing up to 5 min with modified independence and SpO2 maintaining in 90s, in preparation for ADLs.  OT Frequency: Min 3X/week   Barriers to D/C:            Co-evaluation              AM-PAC OT "6 Clicks" Daily Activity     Outcome Measure Help from another person eating meals?: A Little Help from another person taking care of personal grooming?: A Little Help from another person toileting, which includes using toliet, bedpan, or urinal?: A Little Help from another person bathing (including washing, rinsing, drying)?: A Little Help from another person to put on and taking off regular upper body clothing?: A Little Help from another person to put on and taking off regular lower body clothing?: A Little 6 Click Score: 18   End of Session Equipment Utilized During Treatment: Gait belt;Rolling walker;Oxygen Nurse Communication: Mobility status  Activity Tolerance: Other (comment);Patient limited by fatigue(Limited by SOB) Patient left: in chair;with call bell/phone within reach;with chair alarm set  OT Visit Diagnosis: Unsteadiness on feet (R26.81);Muscle weakness (generalized) (M62.81)                Time: [id number] OT Time Calculation (min): 30 min Charges:  OT General Charges $OT Visit: 1 Visit OT Evaluation $OT Eval Moderate Complexity: 1 Mod OT Treatments $Self Care/Home Management : 8-22 mins  Mauri Brooklyn OTR/L [phone]  Mauri Brooklyn 06/05/2019, 3:06 PM

## 2019-06-05 NOTE — Progress Notes (Signed)
Inpatient Diabetes Program Recommendations  AACE/ADA: New Consensus Statement on Inpatient Glycemic Control   Target Ranges:  Prepandial:   less than 140 mg/dL      Peak postprandial:   less than 180 mg/dL (1-2 hours)      Critically ill patients:  140 - 180 mg/dL   Results for GILAD, VANWIE (MRN [id number]) as of 06/05/2019 12:00  Ref. Range 06/04/2019 08:01 06/04/2019 12:40 06/04/2019 16:26 06/04/2019 22:41 06/05/2019 07:32  Glucose-Capillary Latest Ref Range: 70 - 99 mg/dL 220 (H) 418 (H) 397 (H) 155 (H) 270 (H)   Review of Glycemic Control  Diabetes history: DM2 Outpatient Diabetes medications: Lantus 25 units daily, Humalog 15 units with lunch Current orders for Inpatient glycemic control: Levemir 20 units daily, Novolog 0-15 units TID with meals, Novolog 0-5 units QHS; Solumedrol 60 mg Q12H  Inpatient Diabetes Program Recommendations:     Insulin-Basal: Please consider increasing Levemir to 25 units daily.  Insulin-Meal Coverage: If steroids are continued and if post prandial glucose is consistently greater than 180 mg/dl, please consider ordering Novolog 5 units TID with meals for meal coverage if patient eats at least 50% of meals.   Thanks, Barnie Alderman, RN, MSN, CDE Diabetes Coordinator Inpatient Diabetes Program [phone] (Team Pager from 8am to 5pm)

## 2019-06-06 LAB — BASIC METABOLIC PANEL
Anion gap: 15 (ref 5–15)
BUN: 55 mg/dL — ABNORMAL HIGH (ref 8–23)
CO2: 25 mmol/L (ref 22–32)
Calcium: 8.4 mg/dL — ABNORMAL LOW (ref 8.9–10.3)
Chloride: 102 mmol/L (ref 98–111)
Creatinine, Ser: 1.29 mg/dL — ABNORMAL HIGH (ref 0.61–1.24)
GFR calc Af Amer: >60 mL/min (ref >60)
GFR calc non Af Amer: 53 mL/min — ABNORMAL LOW (ref >60)
Glucose, Bld: 113 mg/dL — ABNORMAL HIGH (ref 70–99)
Potassium: 3.3 mmol/L — ABNORMAL LOW (ref 3.5–5.1)
Sodium: 142 mmol/L (ref 135–145)

## 2019-06-06 LAB — CBC
HCT: 42.9 % (ref 39.0–52.0)
Hemoglobin: 14.3 g/dL (ref 13.0–17.0)
MCH: 30.5 pg (ref 26.0–34.0)
MCHC: 33.3 g/dL (ref 30.0–36.0)
MCV: 91.5 fL (ref 80.0–100.0)
Platelets: 234 10*3/uL (ref 150–400)
RBC: 4.69 MIL/uL (ref 4.22–5.81)
RDW: 12.6 % (ref 11.5–15.5)
WBC: 8.8 10*3/uL (ref 4.0–10.5)
nRBC: 0 % (ref 0.0–0.2)

## 2019-06-06 LAB — D-DIMER, QUANTITATIVE: D-Dimer, Quant: 1.88 ug/mL-FEU — ABNORMAL HIGH (ref 0.00–0.50)

## 2019-06-06 LAB — GLUCOSE, CAPILLARY
Glucose-Capillary: 100 mg/dL — ABNORMAL HIGH (ref 70–99)
Glucose-Capillary: 113 mg/dL — ABNORMAL HIGH (ref 70–99)
Glucose-Capillary: 196 mg/dL — ABNORMAL HIGH (ref 70–99)
Glucose-Capillary: 378 mg/dL — ABNORMAL HIGH (ref 70–99)
Glucose-Capillary: 202 mg/dL — ABNORMAL HIGH (ref 70–99)

## 2019-06-06 LAB — CULTURE, BLOOD (ROUTINE X 2): Special Requests: ADEQUATE

## 2019-06-06 LAB — C-REACTIVE PROTEIN: CRP: 7.5 mg/dL — ABNORMAL HIGH (ref <1.0)

## 2019-06-06 LAB — FERRITIN: Ferritin: 1077 ng/mL — ABNORMAL HIGH (ref 24–336)

## 2019-06-06 MED ORDER — LORAZEPAM 2 MG/ML IJ SOLN
1.0000 mg | INTRAMUSCULAR | Status: DC | PRN
  Administered 2019-06-06 (×2): 2 mg via INTRAVENOUS
  Administered 2019-06-06: 23:00:00 4 mg via INTRAVENOUS
  Administered 2019-06-07 (×9): 2 mg via INTRAVENOUS
  Administered 2019-06-07: 3 mg via INTRAVENOUS
  Administered 2019-06-07: 1 mg via INTRAVENOUS
  Administered 2019-06-08 – 2019-06-09 (×6): 2 mg via INTRAVENOUS
  Administered 2019-06-09 (×3): 4 mg via INTRAVENOUS
  Administered 2019-06-10 (×5): 2 mg via INTRAVENOUS
  Administered 2019-06-10: 21:00:00 1 mg via INTRAVENOUS
  Administered 2019-06-10: 07:00:00 2 mg via INTRAVENOUS
  Administered 2019-06-10: 03:00:00 4 mg via INTRAVENOUS
  Administered 2019-06-11 (×5): 2 mg via INTRAVENOUS
  Administered 2019-06-11: 1 mg via INTRAVENOUS
  Administered 2019-06-11 – 2019-06-12 (×5): 2 mg via INTRAVENOUS
  Filled 2019-06-06: qty 1
  Filled 2019-06-06: qty 2
  Filled 2019-06-06 (×6): qty 1
  Filled 2019-06-06 (×2): qty 2
  Filled 2019-06-06 (×15): qty 1
  Filled 2019-06-06: qty 2
  Filled 2019-06-06 (×5): qty 1
  Filled 2019-06-06: qty 2
  Filled 2019-06-06 (×12): qty 1
  Filled 2019-06-06: qty 2

## 2019-06-06 MED ORDER — FOLIC ACID 1 MG PO TABS
1.0000 mg | ORAL_TABLET | Freq: Every day | ORAL | Status: DC
  Administered 2019-06-06: 1 mg via ORAL
  Filled 2019-06-06 (×2): qty 1

## 2019-06-06 MED ORDER — LORAZEPAM 1 MG PO TABS
1.0000 mg | ORAL_TABLET | ORAL | Status: DC | PRN
  Administered 2019-06-06: 13:00:00 1 mg via ORAL
  Administered 2019-06-06 (×2): 2 mg via ORAL
  Filled 2019-06-06: qty 2
  Filled 2019-06-06: qty 1

## 2019-06-06 MED ORDER — THIAMINE HCL 100 MG PO TABS
100.0000 mg | ORAL_TABLET | Freq: Every day | ORAL | Status: DC
  Administered 2019-06-06: 100 mg via ORAL
  Filled 2019-06-06 (×2): qty 1

## 2019-06-06 MED ORDER — POTASSIUM CHLORIDE CRYS ER 20 MEQ PO TBCR
40.0000 meq | EXTENDED_RELEASE_TABLET | Freq: Once | ORAL | Status: AC
  Administered 2019-06-06: 09:00:00 40 meq via ORAL
  Filled 2019-06-06: qty 2

## 2019-06-06 MED ORDER — TRAZODONE HCL 50 MG PO TABS: 25.0000 mg | ORAL_TABLET | Freq: Every evening | ORAL | Status: DC | PRN

## 2019-06-06 MED ORDER — ENOXAPARIN SODIUM 40 MG/0.4ML ~~LOC~~ SOLN
40.0000 mg | SUBCUTANEOUS | Status: DC
  Administered 2019-06-06 – 2019-06-07 (×2): 40 mg via SUBCUTANEOUS
  Filled 2019-06-06 (×2): qty 0.4

## 2019-06-06 MED ORDER — THIAMINE HCL 100 MG/ML IJ SOLN
100.0000 mg | Freq: Every day | INTRAMUSCULAR | Status: DC
  Administered 2019-06-07 – 2019-06-12 (×5): 100 mg via INTRAVENOUS
  Filled 2019-06-06 (×5): qty 2

## 2019-06-06 MED ORDER — ADULT MULTIVITAMIN W/MINERALS CH
1.0000 | ORAL_TABLET | Freq: Every day | ORAL | Status: DC
  Administered 2019-06-06 – 2019-06-12 (×2): 1 via ORAL
  Filled 2019-06-06 (×3): qty 1

## 2019-06-06 NOTE — Evaluation (Signed)
Physical Therapy Evaluation Patient Details Name: Brandon [name] MRN: [id number] DOB: November 17, 1940 Today's Date: 06/06/2019   History of Present Illness  Brandon [name] is a 79 y.o. male with history of cardiac transplant, diabetes mellitus type 2, hypertension, pacemaker, CVA, chronic kidney disease stage III was recently admitted (1/28-1/30/21) after a fall at that time patient was diagnosed with COVID-19 infection was discharged home.  For 2 days PTA patient was not doing well with increased confusion and EMS was called and he was brought to the ER (06/30/2019).  Pt dx with acute metabolic encephalopathy, sepsis, lactic acidosis, staph aureus septicemia, acute hypoxic respiratory failure due to COVID 19 viral PNA, ETOH withdrawal.    Clinical Impression  Pt presents with moderate cognitive deficts related to safety, impulsivity and awareness.  He is oriented, but a bit slow to process at times.  He did desat on 10L HFNC to 86% with (+) 3/4 DOE, but rebounded quickly with     Follow Up Recommendations Home health PT;Supervision/Assistance - 24 hour    Equipment Recommendations  Other (comment)(possible home O2)    Recommendations for Other Services       Precautions / Restrictions Precautions Precautions: Fall;Other (comment) Precaution Comments: Monitor SpO2, desats quickly. 7L HFNC Restrictions Weight Bearing Restrictions: No      Mobility  Bed Mobility Overal bed mobility: Needs Assistance Bed Mobility: Sit to Supine;Supine to Sit     Supine to sit: Supervision;HOB elevated(use of bedrail) Sit to supine: Supervision;HOB elevated   General bed mobility comments: Pt was OOB in the recliner chair when PT entered the room, observed earlier getting himself EOB without physical assist.   Transfers Overall transfer level: Needs assistance Equipment used: Rolling walker (2 wheeled) Transfers: Sit to/from [facility] Sit to Stand: Min assist Stand pivot transfers:  Min assist       General transfer comment: Min assist for balance and stability during transitions.  Cues for safe UE placement during transitions.       Balance Overall balance assessment: Needs assistance Sitting-balance support: Feet supported Sitting balance-Leahy Scale: Good     Standing balance support: Bilateral upper extremity supported Standing balance-Leahy Scale: Poor Standing balance comment: needs external support from RW and therapist.                              Pertinent Vitals/Pain Pain Assessment: Faces Faces Pain Scale: Hurts little more Pain Location: generalized (L arm is bandaged) Pain Descriptors / Indicators: Grimacing;Guarding Pain Intervention(s): Limited activity within patient's tolerance;Monitored during session;Repositioned    Home Living Family/patient expects to be discharged to:: Private residence Living Arrangements: Spouse/significant other(Brandon [name] is retired) Available Help at Discharge: Family;Available 24 hours/day Type of Home: House Home Access: Level entry     Home Layout: One level Home Equipment: Walker - 4 wheels;Cane - single point;Shower seat;Grab bars - toilet;Grab bars - tub/shower Additional Comments: PLOF obtained from previous admission and pt report    Prior Function Level of Independence: Independent         Comments: Pt independent with ADLs and mobility. Pt does not ambulate with an assistive device and reports 6 falls in the last 6 months. Pt reports that he and his wife share IADL responsibilities. Pt does not use oxygen at home.      Hand Dominance   Dominant Hand: Left    Extremity/Trunk Assessment   Upper Extremity Assessment Upper Extremity Assessment: Defer to OT evaluation  Lower Extremity Assessment Lower Extremity Assessment: Generalized weakness(grossly at least 3/5 and equal bil per functional assessment)    Cervical / Trunk Assessment Cervical / Trunk Assessment: Normal   Communication   Communication: HOH  Cognition Arousal/Alertness: Awake/alert Behavior During Therapy: Restless(fidgety) Overall Cognitive Status: Impaired/Different from baseline Area of Impairment: Orientation;Attention;Memory;Following commands;Safety/judgement;Awareness;Problem solving                 Orientation Level: (pt was oriented, but took some time to get to right answers) Current Attention Level: Selective Memory: Decreased recall of precautions(found getting out of bed pulling off his O2 earlier today) Following Commands: Follows one step commands with increased time Safety/Judgement: Decreased awareness of safety;Decreased awareness of deficits Awareness: Intellectual   General Comments: Pt had no idea he was on O2 when I walked into the room (it was in his nose and on 10 L HFNC).  He was very restless, fidgeting in his chair, found earlier today EOB with bed alarm ringing, pulling his O2 off to try to get up to go to the bathroom.  Pt could get orientation questions out, but some answers were vague and he was slow to get to the right answer.       General Comments General comments (skin integrity, edema, etc.): Pt on 10 L HFNC and dropped to 86% on that during recovery after standing marches with RW.  O2 being measured by earlobe pedi probe.  Pt rebounds to the low 90s or upper 80s in <5 mins.      Exercises Other Exercises Other Exercises: Encouraged pursed lip breathing throughout Other Exercises: Incentive spirometer x 5 with min cues on technique. Pt pulling 1000 mL    Assessment/Plan    PT Assessment Patient needs continued PT services  PT Problem List Decreased strength;Decreased activity tolerance;Decreased balance;Decreased mobility;Decreased knowledge of use of DME;Decreased knowledge of precautions;Cardiopulmonary status limiting activity;Pain;Decreased safety awareness;Decreased cognition       PT Treatment Interventions DME instruction;Gait  training;Stair training;Functional mobility training;Therapeutic activities;Therapeutic exercise;Neuromuscular re-education;Balance training;Cognitive remediation;Patient/family education    PT Goals (Current goals can be found in the Care Plan section)  Acute Rehab PT Goals Patient Stated Goal: to go home PT Goal Formulation: With patient Time For Goal Achievement: 06/20/19 Potential to Achieve Goals: Good    Frequency Min 3X/week    AM-PAC PT "6 Clicks" Mobility  Outcome Measure Help needed turning from your back to your side while in a flat bed without using bedrails?: None Help needed moving from lying on your back to sitting on the side of a flat bed without using bedrails?: None Help needed moving to and from a bed to a chair (including a wheelchair)?: A Little Help needed standing up from a chair using your arms (e.g., wheelchair or bedside chair)?: A Little Help needed to walk in hospital room?: A Little Help needed climbing 3-5 steps with a railing? : A Little 6 Click Score: 20    End of Session Equipment Utilized During Treatment: Gait belt;Oxygen Activity Tolerance: Patient limited by fatigue Patient left: in chair;with call bell/phone within reach;with chair alarm set   PT Visit Diagnosis: Repeated falls (R29.6);Muscle weakness (generalized) (M62.81);Difficulty in walking, not elsewhere classified (R26.2)    Time:  -  1221 -1239     Charges:  1 EV mod  Verdene Lennert, PT, DPT  Acute Rehabilitation [phone] pager #[phone] office  @ [facility]: [phone]             06/06/2019, 1:51  PM   

## 2019-06-06 NOTE — Progress Notes (Addendum)
PROGRESS NOTE    Brandon [name]  [id number] DOB: May 02, 1941 DOA: 06/08/2019 PCP: Leanna Battles, MD    Brief Narrative:   Brandon [name] is a 79 y.o. male with history of cardiac transplant, diabetes mellitus type 2, hypertension, pacemaker, CVA, chronic kidney disease stage III was recently admitted after a fall at that time patient was diagnosed with COVID-19 infection was discharged home after physical therapy on June 01, 2019.  For the last 2 days per patient's wife patient has not been doing well not eating well and had some diarrhea and was increasingly confused.  Patient blood sugar was running low and EMS was called.  After patient was given some food blood sugar improved but since patient was getting more weak he was brought to the ER.  In the ER patient was hypotensive with lactic acid of 4.6 CRP 22.6 creatinine worsened to 1.9 from 1.6 sodium 134 procalcitonin 0.19 CBC largely unremarkable except for hemoglobin 17.1 D-dimer 1.83 INR 1.1 UA chest x-ray unremarkable.  CT head unremarkable. Patient is oriented to his name and place after patient was started on fluids and empiric antibiotics for sepsis.  Prior to that patient was confused.  Patient admitted for sepsis with Covid infection.  EKG shows sinus tachycardia. Patient's blood pressure improved with fluids and stress dose steroids.   Assessment & Plan:   Principal Problem:   COVID-19 Active Problems:   Sepsis ([place])   Heart transplanted ([place])   CAD (coronary artery disease)   Essential hypertension   Type 2 diabetes mellitus with stage 3 chronic kidney disease (HCC)   Encephalopathy   Acute kidney injury superimposed on chronic kidney disease ([place])   Positive blood culture   Acute metabolic encephalopathy Etiology likely multifactorial given staph aureus septicemia combined with hypoxic respiratory failure secondary to Covid-19 viral pneumonia.  CT head with chronic ischemic microangiopathy and old right  frontal lobe infarct without acute intracranial abnormality.  Continue treatment with ceftriaxone for septicemia and remdesivir/steroids for Covid-pneumonia as below.  Sepsis, present on admission Lactic acidosis Staph aureus septicemia Blood cultures 2 out of 4 positive for GPC's.  Complicated by history of pacemaker placement.  Discussed with infectious disease, Dr. Megan Salon on 06/05/2019, and believes he could have a true bacteremia with methicillin sensitive coagulase-negative Staphylococcus. TTE 2/3 with EF [id number], grade 1 diastolic dysfunction, LA mildly dilated, normal IVC, valves not well seen but no obvious vegetations. --Lactic acid 4.6-->1.6 --Continue ceftriaxone 2 g IV every 24 hours --Repeat blood cultures 06/05/2019 pending --Awaiting for full speciation and antibiotic susceptibility on the 2 isolates to see if they are the same; which would support true bacteremia or different which would indicate insignificant contaminants.  Acute hypoxic respiratory failure secondary to acute Covid-19 viral pneumonia during the ongoing 2020 Covid 19 Pandemic - POA Diagnosed 05/31/2019.  Patient presents with progressive dyspnea, found to be hypoxic on room air.  0.83, fibrinogen 750, CRP 22.6.  Chest x-ray with progressive bibasilar interstitial and groundglass opacities consistent with multifocal pneumonia. --ddimer 1.83-->3.60-->1.65-->1.88 --CRP 22.6-->16.1-->12.4-->7.5 --Continue remdesivir, plan 5-day course; day #3/5 --Continue Solu-Medrol 60 mg every 12 hours --Continue supplemental oxygen, titrate to maintain SPO2 greater than 92%; currently on 5 L heated high flow nasal cannula --Continue supportive care with vitamin C, zinc, Tylenol, Dulera MDI --Follow CBC, CMP, D-dimer, ferritin, and CRP daily --Continue airborne/contact isolation precautions  EtOH abuse/withdrawl Patient endorses 1.5 bottles of wine daily.  Starting to have some tremors. --CIWAA protocol with symptom triggered  Ativan --Folic acid,  thiamine, multivitamin  History of cardiac transplant Patient on mycophenolate 1000 mg p.o. twice daily, prednisone 5 mg p.o. daily, tacrolimus 1 mg p.o. twice daily at home. Follows with [facility] cardiology/transplant team. --Continue tacrolimus 1 mg p.o. BID --On high-dose steroids as above for Covid-19 pneumonia --Holding mycophenolate per transplant team, plan restart on 06/14/2019  Type 2 diabetes mellitus Hemoglobin A1c 9.0, not optimally controlled.  On Lantus 24 units subcutaneously daily, Humalog 15 units subcutaneously daily with lunch. --Increase Levemir to 25 units subcutaneously daily today --Start NovoLog 5 units subcutaneously 3 times daily AC --Insulin sliding scale for further coverage  Acute on chronic kidney disease stage III Baseline creatinine 1.6, creatinine 2.0 on admission --Cr 2.02-->1.29 --Continue to hold home lisinopril --Avoid nephrotoxins, renally dose all medications --Monitor renal function daily  Essential hypertension On lisinopril 5 mg p.o. daily, propranolol 10 mg p.o. twice daily at home.  Patient was hypotensive on presentation. --BP 140/82 today --Restart home propranolol 10 mg p.o. twice daily today --Continue to hold home lisinopril for now --Monitor blood pressure closely  Weakness, debility: --PT/OT evaluation   DVT prophylaxis: Heparin Code Status: Full code Family Communication: Discussed with patient extensively at bedside Disposition Plan: From home with spouse, to be determined, awaiting PT/OT evaluation, awaiting further identification and susceptibility testing from blood cultures, continues on heated high flow nasal cannula   Consultants:   ID - discussed with Dr. Megan Salon on 06/05/2019  Procedures:   TTE 06/05/2019 IMPRESSIONS    1. Left ventricular ejection fraction, by visual estimation, is 65 to  70%. The left ventricle has hyperdynamic function. There is no left  ventricular hypertrophy.  2. Left  ventricular diastolic parameters are consistent with Grade I  diastolic dysfunction (impaired relaxation).  3. The left ventricle has no regional wall motion abnormalities.  4. Global right ventricle has normal systolic function.The right  ventricular size is normal. No increase in right ventricular wall  thickness.  5. Left atrial size was mildly dilated.  6. Right atrial size was normal.  7. The mitral valve is normal in structure. No evidence of mitral valve  regurgitation.  8. The tricuspid valve is grossly normal.  9. The tricuspid valve is grossly normal. Tricuspid valve regurgitation  is not demonstrated.  10. The aortic valve is grossly normal. Aortic valve regurgitation is not  visualized.  11. The pulmonic valve was grossly normal. Pulmonic valve regurgitation is  not visualized.  12. The inferior vena cava is normal in size with greater than 50%  respiratory variability, suggesting right atrial pressure of 3 mmHg.  13. The interatrial septum was not wll visualized.  14. Interpretation limited due to poor sound wave transmission. LVEF is  normal. LV appears underfilled. Valves not seen well but no obvious  vegetations noted.limited due to poor sound wave trans,mis   COVID Treatment:   Remdesivir   solumedrol   Subjective: Patient seen and examined bedside, resting comfortably.  Slightly confused.  Continues with shortness of breath especially with any type of exertion.  Reports poor sleep overnight.  Denies fever/chills/night sweats, no nausea/vomiting/diarrhea, no chest pain, no palpitations, no abdominal pain.  No other acute events overnight per nursing staff.  Objective: Vitals:   06/06/19 0620 06/06/19 0621 06/06/19 0641 06/06/19 0728  BP:    130/77  Pulse:   89 96  Resp:   19 (!) 22  Temp:    97.8 F (36.6 C)  TempSrc:    Oral  SpO2: 92% 95% 97% 90%  Weight:  Height:        Intake/Output Summary (Last 24 hours) at 06/06/2019 1048 Last data  filed at 06/06/2019 0400 Gross per 24 hour  Intake 1040 ml  Output 650 ml  Net 390 ml   Filed Weights   06/20/2019 2239 06/04/19 0500 06/06/19 0500  Weight: 90.7 kg 85.3 kg 88.3 kg    Examination:  General exam: Appears calm and comfortable  Respiratory system: Clear to auscultation. Respiratory effort normal.  On 15 L heated high flow nasal cannula with SPO2 97% Cardiovascular system: S1 & S2 heard, RRR. No JVD, murmurs, rubs, gallops or clicks. No pedal edema. Gastrointestinal system: Abdomen is nondistended, soft and nontender. No organomegaly or masses felt. Normal bowel sounds heard. Central nervous system: Alert, oriented to place Brandon [name]) and time (2021), person Brandon [name]).  Slightly tremulous, no other focal neurological deficits. Extremities: Symmetric 5 x 5 power. Skin: No rashes, lesions or ulcers Psychiatry: Judgement and insight appear poor. Mood & affect appropriate.     Data Reviewed: I have personally reviewed following labs and imaging studies  CBC: Recent Labs  Lab 05/30/19 2116 05/30/19 2116 06/01/19 0625 06/14/2019 2257 06/04/19 0444 06/05/19 0205 06/06/19 0225  WBC 4.0   < > 3.3* 6.7 4.5 5.0 8.8  NEUTROABS 2.8  --  2.3 5.6 3.7  --   --   HGB 14.7   < > 14.5 17.1* 13.2 13.5 14.3  HCT 44.6   < > 44.6 52.2* 39.2 40.5 42.9  MCV 94.5   < > 93.9 93.2 92.9 91.8 91.5  PLT 98*   < > 86* 162 107* 134* 234   < > = values in this interval not displayed.   Basic Metabolic Panel: Recent Labs  Lab 05/30/19 2116 06/01/19 0625 06/16/2019 2257 06/04/19 0444 06/06/19 0225  NA 130* 130* 134* 135 142  K 4.3 4.2 4.2 4.1 3.3*  CL 95* 97* 98 104 102  CO2 20* 20* 22 18* 25  GLUCOSE 381* 284* 88 200* 113*  BUN 29* 33* 44* 44* 55*  CREATININE 1.60* 1.68* 1.94* 2.02* 1.29*  CALCIUM 9.0 8.5* 9.3 7.6* 8.4*  MG  --  1.7  --   --   --    GFR: Estimated Creatinine Clearance: 52.7 mL/min (A) (by C-G formula based on SCr of 1.29 mg/dL (H)). Liver Function  Tests: Recent Labs  Lab 05/30/19 2116 06/01/19 0625 06/12/2019 2257 06/04/19 0444  AST 20 24 27 19   ALT 24 22 23 19   ALKPHOS 68 60 66 48  BILITOT 1.1 1.1 1.4* 0.7  PROT 6.8 6.2* 7.6 5.6*  ALBUMIN 4.2 3.6 4.2 3.0*   No results for input(s): LIPASE, AMYLASE in the last 168 hours. No results for input(s): AMMONIA in the last 168 hours. Coagulation Profile: Recent Labs  Lab 05/30/19 2348 06/21/2019 2257  INR 1.0 1.1   Cardiac Enzymes: No results for input(s): CKTOTAL, CKMB, CKMBINDEX, TROPONINI in the last 168 hours. BNP (last 3 results) No results for input(s): PROBNP in the last 8760 hours. HbA1C: Recent Labs    06/05/19 0205  HGBA1C 9.0*   CBG: Recent Labs  Lab 06/05/19 1158 06/05/19 1611 06/06/19 0157 06/06/19 0237 06/06/19 0732  GLUCAP 354* 156* 100* 113* 196*   Lipid Profile: Recent Labs    06/16/2019 2257  TRIG 129   Thyroid Function Tests: No results for input(s): TSH, T4TOTAL, FREET4, T3FREE, THYROIDAB in the last 72 hours. Anemia Panel: Recent Labs    06/05/19 0205 06/06/19 0225  FERRITIN  857* 1,077*   Sepsis Labs: Recent Labs  Lab 06/17/2019 2257 06/05/2019 2257 06/04/19 0110 06/04/19 0444 06/04/19 0706 06/04/19 1155  PROCALCITON 0.19  --   --  0.24  --  0.24  LATICACIDVEN 4.6*   < > 2.7* 1.7 2.6* 1.6   < > = values in this interval not displayed.    Recent Results (from the past 240 hour(s))  Blood Culture (routine x 2)     Status: None   Collection Time: 05/30/19 11:45 PM   Specimen: BLOOD  Result Value Ref Range Status   Specimen Description BLOOD LEFT ARM  Final   Special Requests   Final    BOTTLES DRAWN AEROBIC AND ANAEROBIC Blood Culture adequate volume   Culture   Final    NO GROWTH 5 DAYS Performed at [facility], [address], [place], [place] [address]    Report Status 06/05/2019 FINAL  Final  Blood Culture (routine x 2)     Status: None   Collection Time: 05/30/19 11:50 PM   Specimen: BLOOD  Result Value Ref  Range Status   Specimen Description BLOOD LEFT HAND  Final   Special Requests   Final    BOTTLES DRAWN AEROBIC ONLY Blood Culture adequate volume   Culture   Final    NO GROWTH 5 DAYS Performed at [facility], [place] [address] [place], [place] [address]    Report Status 06/05/2019 FINAL  Final  Urine culture     Status: Abnormal   Collection Time: 05/31/19 12:37 AM   Specimen: In/Out Cath Urine  Result Value Ref Range Status   Specimen Description IN/OUT CATH URINE  Final   Special Requests   Final    NONE Performed at [facility], [place] [address] [place], [place] [address]    Culture 20,000 COLONIES/mL ESCHERICHIA COLI (A)  Final   Report Status 06/01/2019 FINAL  Final   Organism ID, Bacteria ESCHERICHIA COLI (A)  Final      Susceptibility   Escherichia coli - MIC*    AMPICILLIN <=2 SENSITIVE Sensitive     CEFAZOLIN <=4 SENSITIVE Sensitive     CEFTRIAXONE <=0.25 SENSITIVE Sensitive     CIPROFLOXACIN <=0.25 SENSITIVE Sensitive     GENTAMICIN <=1 SENSITIVE Sensitive     IMIPENEM <=0.25 SENSITIVE Sensitive     NITROFURANTOIN <=16 SENSITIVE Sensitive     TRIMETH/SULFA <=20 SENSITIVE Sensitive     AMPICILLIN/SULBACTAM <=2 SENSITIVE Sensitive     PIP/TAZO <=4 SENSITIVE Sensitive     * 20,000 COLONIES/mL ESCHERICHIA COLI  Culture, blood (Routine x 2)     Status: Abnormal   Collection Time: 06/18/2019 10:57 PM   Specimen: BLOOD  Result Value Ref Range Status   Specimen Description BLOOD LEFT ARM  Final   Special Requests   Final    BOTTLES DRAWN AEROBIC AND ANAEROBIC Blood Culture adequate volume   Culture  Setup Time   Final    GRAM POSITIVE COCCI IN PAIRS IN BOTH AEROBIC AND ANAEROBIC BOTTLES CRITICAL RESULT CALLED TO, READ BACK BY AND VERIFIED WITH: Qu[place] [id number] 06/04/19 A BROWNING Performed at [facility], [place] [address] [place], [place] [address]    Culture STREPTOCOCCUS MITIS/ORALIS (A)  Final   Report Status 06/06/2019 FINAL  Final   Organism  ID, Bacteria STREPTOCOCCUS MITIS/ORALIS  Final      Susceptibility   Streptococcus mitis/oralis - MIC*    TETRACYCLINE 4 SENSITIVE Sensitive  VANCOMYCIN 0.5 SENSITIVE Sensitive     CLINDAMYCIN >=1 RESISTANT Resistant     PENICILLIN Value in next row Sensitive      SENSITIVE<=0.06    CEFTRIAXONE Value in next row Sensitive      SENSITIVE<=0.12    * STREPTOCOCCUS MITIS/ORALIS  Urine culture     Status: Abnormal   Collection Time: 06/30/2019 11:38 PM   Specimen: Urine, Clean Catch  Result Value Ref Range Status   Specimen Description URINE, CLEAN CATCH  Final   Special Requests   Final    NONE Performed at [facility], [address] [place], [place] [address]    Culture MULTIPLE SPECIES PRESENT, SUGGEST RECOLLECTION (A)  Final   Report Status 06/04/2019 FINAL  Final  Culture, blood (Routine x 2)     Status: Abnormal   Collection Time: 06/04/19  1:10 AM   Specimen: BLOOD  Result Value Ref Range Status   Specimen Description BLOOD RIGHT FOREARM  Final   Special Requests   Final    BOTTLES DRAWN AEROBIC AND ANAEROBIC Blood Culture adequate volume   Culture  Setup Time   Final    ANAEROBIC BOTTLE ONLY GRAM POSITIVE COCCI CRITICAL RESULT CALLED TO, READ BACK BY AND VERIFIED WITH: Deborra Medina [facility] 06/04/19 2254 JDW Performed at [facility], [place] [address] [place], [place] [address]    Culture STAPHYLOCOCCUS EPIDERMIDIS (A)  Final   Report Status 06/06/2019 FINAL  Final   Organism ID, Bacteria STAPHYLOCOCCUS EPIDERMIDIS  Final      Susceptibility   Staphylococcus epidermidis - MIC*    CIPROFLOXACIN <=0.5 SENSITIVE Sensitive     ERYTHROMYCIN <=0.25 SENSITIVE Sensitive     GENTAMICIN <=0.5 SENSITIVE Sensitive     OXACILLIN <=0.25 SENSITIVE Sensitive     TETRACYCLINE >=16 RESISTANT Resistant     VANCOMYCIN 1 SENSITIVE Sensitive     TRIMETH/SULFA <=10 SENSITIVE Sensitive     CLINDAMYCIN <=0.25 SENSITIVE Sensitive     RIFAMPIN <=0.5 SENSITIVE Sensitive     Inducible  Clindamycin NEGATIVE Sensitive     * STAPHYLOCOCCUS EPIDERMIDIS  Blood Culture ID Panel (Reflexed)     Status: Abnormal   Collection Time: 06/04/19  1:10 AM  Result Value Ref Range Status   Enterococcus species NOT DETECTED NOT DETECTED Final   Listeria monocytogenes NOT DETECTED NOT DETECTED Final   Staphylococcus species DETECTED (A) NOT DETECTED Final    Comment: Methicillin (oxacillin) susceptible coagulase negative staphylococcus. Possible blood culture contaminant (unless isolated from more than one blood culture draw or clinical case suggests pathogenicity). No antibiotic treatment is indicated for blood  culture contaminants. CRITICAL RESULT CALLED TO, READ BACK BY AND VERIFIED WITH: T MEYER PHARMD 06/04/19 2254 JDW    Staphylococcus aureus (BCID) NOT DETECTED NOT DETECTED Final   Methicillin resistance NOT DETECTED NOT DETECTED Final   Streptococcus species NOT DETECTED NOT DETECTED Final   Streptococcus agalactiae NOT DETECTED NOT DETECTED Final   Streptococcus pneumoniae NOT DETECTED NOT DETECTED Final   Streptococcus pyogenes NOT DETECTED NOT DETECTED Final   Acinetobacter baumannii NOT DETECTED NOT DETECTED Final   Enterobacteriaceae species NOT DETECTED NOT DETECTED Final   Enterobacter cloacae complex NOT DETECTED NOT DETECTED Final   Escherichia coli NOT DETECTED NOT DETECTED Final   Klebsiella oxytoca NOT DETECTED NOT DETECTED Final   Klebsiella pneumoniae NOT DETECTED NOT DETECTED Final   Proteus species NOT DETECTED NOT DETECTED Final   Serratia marcescens NOT DETECTED NOT DETECTED Final   Haemophilus influenzae  NOT DETECTED NOT DETECTED Final   Neisseria meningitidis NOT DETECTED NOT DETECTED Final   Pseudomonas aeruginosa NOT DETECTED NOT DETECTED Final   Candida albicans NOT DETECTED NOT DETECTED Final   Candida glabrata NOT DETECTED NOT DETECTED Final   Candida krusei NOT DETECTED NOT DETECTED Final   Candida parapsilosis NOT DETECTED NOT DETECTED Final    Candida tropicalis NOT DETECTED NOT DETECTED Final    Comment: Performed at [facility], [place] [address], [place], [place] [address]  MRSA PCR Screening     Status: None   Collection Time: 06/04/19  8:35 AM   Specimen: Nasopharyngeal  Result Value Ref Range Status   MRSA by PCR NEGATIVE NEGATIVE Final    Comment:        The GeneXpert MRSA Assay (FDA approved for NASAL specimens only), is one component of a comprehensive MRSA colonization surveillance program. It is not intended to diagnose MRSA infection nor to guide or monitor treatment for MRSA infections. Performed at [facility], [place] [address], [place], [place] [address]          Radiology Studies: DG Chest Port 1 View  Result Date: 06/04/2019 CLINICAL DATA:  Dyspnea EXAM: PORTABLE CHEST 1 VIEW COMPARISON:  06/08/2019 FINDINGS: Single frontal view of the chest demonstrates stable dual lead pacemaker. Postsurgical changes from median sternotomy. Cardiac silhouette is unchanged. Since 05/30/2019, there has been progression of basilar predominant interstitial and ground-glass opacities, most pronounced at the right lung base. This is superimposed upon chronic scarring at the left lung base. There is chronic elevation of the left hemidiaphragm. No effusion or pneumothorax. IMPRESSION: 1. Progressive bibasilar interstitial and ground-glass opacities as above. Differential includes interstitial edema versus atypical multifocal pneumonia. Electronically Signed   By: Randa Ngo M.D.   On: 06/04/2019 13:13   ECHOCARDIOGRAM COMPLETE  Result Date: 06/05/2019   ECHOCARDIOGRAM REPORT   Patient Name:   ASANTI VILLA Date of Exam: 06/05/2019 Medical Rec #:  [id number]       Height:       69.0 in Accession #:    [id number]      Weight:       188.1 lb Date of Birth:  10/15/1940      BSA:          2.01 m Patient Age:    43 years        BP:           130/54 mmHg Patient Gender: M               HR:           98 bpm. Exam Location:   Inpatient Procedure: 2D Echo, Cardiac Doppler and Color Doppler Indications:    Bacteremia  History:        Patient has no prior history of Echocardiogram examinations.                 Pacemaker, Stroke; Risk Factors:Hypertension and Diabetes. Heart                 Transplant.  Sonographer:    Jannett Celestine RDCS (AE) Referring Phys: [id number] Melondy Blanchard J [place]  Sonographer Comments: Covid positive. IMPRESSIONS  1. Left ventricular ejection fraction, by visual estimation, is 65 to 70%. The left ventricle has hyperdynamic function. There is no left ventricular hypertrophy.  2. Left ventricular diastolic parameters are consistent with Grade I diastolic dysfunction (impaired relaxation).  3. The left ventricle has no regional wall  motion abnormalities.  4. Global right ventricle has normal systolic function.The right ventricular size is normal. No increase in right ventricular wall thickness.  5. Left atrial size was mildly dilated.  6. Right atrial size was normal.  7. The mitral valve is normal in structure. No evidence of mitral valve regurgitation.  8. The tricuspid valve is grossly normal.  9. The tricuspid valve is grossly normal. Tricuspid valve regurgitation is not demonstrated. 10. The aortic valve is grossly normal. Aortic valve regurgitation is not visualized. 11. The pulmonic valve was grossly normal. Pulmonic valve regurgitation is not visualized. 12. The inferior vena cava is normal in size with greater than 50% respiratory variability, suggesting right atrial pressure of 3 mmHg. 13. The interatrial septum was not wll visualized. 14. Interpretation limited due to poor sound wave transmission. LVEF is normal. LV appears underfilled. Valves not seen well but no obvious vegetations noted.limited due to poor sound wave trans,mis FINDINGS  Left Ventricle: Left ventricular ejection fraction, by visual estimation, is 65 to 70%. The left ventricle has hyperdynamic function. The left ventricle has no regional wall  motion abnormalities. There is no left ventricular hypertrophy. Left ventricular diastolic parameters are consistent with Grade I diastolic dysfunction (impaired relaxation). Right Ventricle: The right ventricular size is normal. No increase in right ventricular wall thickness. Global RV systolic function is has normal systolic function. Left Atrium: Left atrial size was mildly dilated. Right Atrium: Right atrial size was normal in size Pericardium: There is no evidence of pericardial effusion. Mitral Valve: The mitral valve is normal in structure. No evidence of mitral valve regurgitation. Tricuspid Valve: The tricuspid valve is grossly normal. Tricuspid valve regurgitation is not demonstrated. Aortic Valve: The aortic valve is grossly normal. Aortic valve regurgitation is not visualized. Pulmonic Valve: The pulmonic valve was grossly normal. Pulmonic valve regurgitation is not visualized. Pulmonic regurgitation is not visualized. Aorta: The aortic root and ascending aorta are structurally normal, with no evidence of dilitation. Venous: The inferior vena cava is normal in size with greater than 50% respiratory variability, suggesting right atrial pressure of 3 mmHg. IAS/Shunts: The interatrial septum was not well visualized.  LEFT VENTRICLE PLAX 2D LVIDd:         2.88 cm  Diastology LVIDs:         1.99 cm  LV e' lateral:   10.10 cm/s LV PW:         1.06 cm  LV E/e' lateral: 7.9 LV IVS:        1.12 cm LVOT diam:     1.80 cm LV SV:         19 ml LV SV Index:   9.29 LVOT Area:     2.54 cm  LEFT ATRIUM             Index LA diam:        4.20 cm 2.09 cm/m LA Vol (A2C):   40.2 ml 19.97 ml/m LA Vol (A4C):   52.1 ml 25.89 ml/m LA Biplane Vol: 47.6 ml 23.65 ml/m  AORTIC VALVE LVOT Vmax:   57.60 cm/s LVOT Vmean:  45.500 cm/s LVOT VTI:    0.142 m  AORTA Ao Root diam: 2.90 cm MITRAL VALVE MV Area (PHT): 4.15 cm             SHUNTS MV PHT:        53.07 msec           Systemic VTI:  0.14 m MV Decel Time: 183 msec  Systemic Diam: 1.80 cm MV E velocity: 79.50 cm/s 103 cm/s MV A velocity: 47.50 cm/s 70.3 cm/s MV E/A ratio:  1.67       1.5  Glori Bickers MD Electronically signed by Glori Bickers MD Signature Date/Time: 06/05/2019/5:45:09 PM    Final         Scheduled Meds: . allopurinol  300 mg Oral Daily  . vitamin C  500 mg Oral Daily  . clopidogrel  75 mg Oral QHS  . heparin  5,000 Units Subcutaneous Q8H  . insulin aspart  0-15 Units Subcutaneous TID WC  . insulin aspart  0-5 Units Subcutaneous QHS  . insulin aspart  5 Units Subcutaneous TID WC  . insulin detemir  25 Units Subcutaneous Daily  . Ipratropium-Albuterol  1 puff Inhalation Q6H  . latanoprost  1 drop Both Eyes QHS  . methylPREDNISolone (SOLU-MEDROL) injection  60 mg Intravenous Q12H  . mometasone-formoterol  2 puff Inhalation BID  . [START ON 06/14/2019] mycophenolate  1,000 mg Oral BID  . pravastatin  20 mg Oral QHS  . propranolol  10 mg Oral BID AC  . tacrolimus  1 mg Oral BID  . zinc sulfate  220 mg Oral Daily   Continuous Infusions: . cefTRIAXone (ROCEPHIN)  IV Stopped (06/05/19 2012)  . remdesivir 100 mg in NS 100 mL 100 mg (06/06/19 0921)     LOS: 2 days    Time spent: 39 minutes spent on chart review, discussion with nursing staff, consultants, updating family and interview/physical exam; more than 50% of that time was spent in counseling and/or coordination of care.    Nashanti Duquette J [place] DO [facility] Available via Epic secure chat 7am-7pm After these hours, please refer to coverage provider listed on [email or web address] 06/06/2019, 10:48 AM

## 2019-06-06 NOTE — Progress Notes (Signed)
Patient ID: ARTEZ RIGONI, male   DOB: 11/19/1940, 79 y.o.   MRN: [id number]          [facility]                   Date of Admission:  06/17/2019                                     Day 4 remdesivir and steroids                                                                                     Day 3 ceftriaxone    Mr. Ma admission blood cultures are positive.  1 set his grown methicillin sensitive coagulase-negative staph.  The second set grew Streptococcus mitis/oralis.  He coagulase-negative staph probably represents a contaminant. but I would certainly continue to treat the strep isolate given that he has a permanent pacemaker.  Repeat blood cultures are negative at 24 hours.  There is no evidence of valvular or pacemaker by TTE.  I would not pursue TEE at this time.  Plan: 1. Continue ceftriaxone for now 2. Await results of repeat blood cultures                                                                                       Michel Bickers, MD [facility] [place] [phone] pager   [phone] cell 06/05/2019, 2:22 PM

## 2019-06-06 NOTE — Progress Notes (Signed)
Ok to change SQ heparin to Lovenox due to improved renal function per Dr. [place]  Onnie Boer, PharmD, BCIDP, AAHIVP, CPP Infectious Disease Pharmacist 06/06/2019 1:41 PM

## 2019-06-06 NOTE — Progress Notes (Signed)
Occupational Therapy Treatment Patient Details Name: Brandon [name] MRN: [id number] DOB: 02-01-1941 Today's Date: 06/06/2019    History of present illness Brandon [name] is a 79 y.o.malewithhistory of cardiac transplant, diabetes mellitus type 2, hypertension, pacemaker, CVA, chronic kidney disease stage III was recently admitted after a fall at that time patient was diagnosed with COVID-19 infection was discharged home after physical therapy on June 01, 2019. For the last 2 days per patient's wife patient has not been doing well not eating well and had some diarrhea and was increasingly confused. Patient blood sugar was running low and EMS was called. After patient was given some food blood sugar improved but since patient was getting more weak he was brought to the ER. In the ER patient was hypotensive with lactic acid of 4.6 CRP 22.6 creatinine worsened to 1.9 from 1.6 sodium 134 procalcitonin 0.19 CBC largely unremarkable except for hemoglobin 17.1 D-dimer 1.83 INR 1.1 UA chest x-ray unremarkable. CT head unremarkable. Patient is oriented to his name and place after patient was started on fluids and empiric antibiotics for sepsis. Prior to that patient was confused. Patient admitted for sepsis with Covid infection. EKG shows sinus tachycardia. Patient's blood pressure improved with fluids and stress dose steroids.   OT comments  Pt requiring increased supplemental oxygen this date compared to previous session. Pt on 15L HFNC with SpO2 95% at rest. Pt tolerated sitting edge of bed 10 minutes while engaging in grooming/hygiene tasks. Pt declined additional mobility or therapy tasks this date stating "I'm not doing it today, I feel crappy." Provided education and max encouragement with pt continuing to refuse. Pt tolerated standing ~ 1 min with RW and min assist to side step up towards the head of bed. Noted several instances of loss of balance with pt requiring min assist to self-correct.  Continued education with pt on safety strategies and fall prevention with poor to fair understanding. SpO2 maintained in 90s throughout. RN updated. OT will continue to follow acutely.    Follow Up Recommendations  SNF;Supervision/Assistance - 24 hour(Depending on progress)    Equipment Recommendations  Other (comment)(TBD)    Recommendations for Other Services      Precautions / Restrictions Precautions Precautions: Fall;Other (comment) Precaution Comments: Monitor SpO2, desats quickly. 15L HFNC Restrictions Weight Bearing Restrictions: No       Mobility Bed Mobility Overal bed mobility: Needs Assistance Bed Mobility: Sit to Supine;Supine to Sit     Supine to sit: Supervision;HOB elevated(use of bedrail) Sit to supine: Supervision;HOB elevated   General bed mobility comments: Use of bedrail. Supervision for safety and line management.  Transfers Overall transfer level: Needs assistance Equipment used: Rolling walker (2 wheeled) Transfers: Sit to/from Stand Sit to Stand: Min assist         General transfer comment: Pt appears more unsteady on feet compared to previous session. Several instances of LOB when side stepping up towards the [facility] with pt requiring min assist to self-correct.     Balance Overall balance assessment: Needs assistance Sitting-balance support: Feet supported Sitting balance-Leahy Scale: Good       Standing balance-Leahy Scale: Poor                             ADL either performed or assessed with clinical judgement   ADL Overall ADL's : Needs assistance/impaired     Grooming: Set up;Supervision/safety;Sitting;Wash/dry hands;Wash/dry face;Oral care  Functional mobility during ADLs: Minimal assistance;Rolling walker General ADL Comments: Pt able to stand and side step up towards head of bed. Pt declined additional activity and requested to get back into bed.      Vision        Perception     Praxis      Cognition Arousal/Alertness: Awake/alert Behavior During Therapy: Restless Overall Cognitive Status: No family/caregiver present to determine baseline cognitive functioning                                 General Comments: Pt confused. Pt asking if therapist sees the snake outside.        Exercises Exercises: Other exercises Other Exercises Other Exercises: Encouraged pursed lip breathing throughout   Shoulder Instructions       General Comments Pt on 15L HFNC with SpO2 95% at rest. SpO2 maintained in 90s throughout session. Pt tolerated sitting EOB ~10 min with supervision. Pt declined further activity, stating "I feel crappy."    Pertinent Vitals/ Pain       Pain Assessment: Faces Faces Pain Scale: Hurts even more Pain Location: Headache Pain Descriptors / Indicators: Constant Pain Intervention(s): Limited activity within patient's tolerance;Monitored during session;Repositioned  Home Living                                          Prior Functioning/Environment              Frequency           Progress Toward Goals  OT Goals(current goals can now be found in the care plan section)  Progress towards OT goals: Progressing toward goals  ADL Goals Pt Will Perform Grooming: with modified independence;standing Pt Will Perform Lower Body Bathing: with modified independence;sit to/from stand Pt Will Perform Lower Body Dressing: with modified independence;sit to/from stand Pt Will Transfer to Toilet: with modified independence;ambulating;regular height toilet Pt Will Perform Toileting - Clothing Manipulation and hygiene: with modified independence;sit to/from stand Additional ADL Goal #1: Pt to recall and verbalize 3 fall prevention strategies with min verbal cues. Additional ADL Goal #2: Pt to tolerate standing up to 5 min with modified independence and SpO2 maintaining in 90s, in preparation for ADLs.   Plan Discharge plan remains appropriate    Co-evaluation                 AM-PAC OT "6 Clicks" Daily Activity     Outcome Measure   Help from another person eating meals?: A Little Help from another person taking care of personal grooming?: A Little Help from another person toileting, which includes using toliet, bedpan, or urinal?: A Little Help from another person bathing (including washing, rinsing, drying)?: A Little Help from another person to put on and taking off regular upper body clothing?: A Little Help from another person to put on and taking off regular lower body clothing?: A Little 6 Click Score: 18    End of Session Equipment Utilized During Treatment: Rolling walker;Oxygen  OT Visit Diagnosis: Unsteadiness on feet (R26.81);Muscle weakness (generalized) (M62.81)   Activity Tolerance Patient limited by fatigue;Patient limited by pain;Other (comment)(Limited by SOB)   Patient Left in bed;with call bell/phone within reach;with bed alarm set   Nurse Communication Mobility status        Time: 1002-1020 OT Time Calculation (  min): 18 min  Charges: OT General Charges $OT Visit: 1 Visit OT Treatments $Therapeutic Activity: 8-22 mins  Mauri Brooklyn OTR/L [phone]    Mauri Brooklyn 06/06/2019, 11:34 AM

## 2019-06-06 NOTE — Progress Notes (Addendum)
Pt shivering/tremors, cool to touch but normal oral temp, not able to get good oxygen read, pt complaining of being cold; increased room temp, put heated blankets on pt, checked pt sugar level (per device on arm, glucose 78, per accu check, sugar level 100), gave pt apple juice, placed on non-rebreather due to oxygen levels in the 80s  0250 Update: rapid RN assessed pt at bedside, rechecked BG, per device on arm, BG 89, per accu-check BG 113 after two apple juice; pt on 15L high flow [place] and non-rebreather and oxygen sats in the 90s, still has slight tremors (less noticeable than before) pt still has AMS and keeps stating, 'I'm going home in the morning'. Still not able to orient pt to surroundings and current situation

## 2019-06-07 LAB — GLUCOSE, CAPILLARY
Glucose-Capillary: 78 mg/dL (ref 70–99)
Glucose-Capillary: 200 mg/dL — ABNORMAL HIGH (ref 70–99)
Glucose-Capillary: 318 mg/dL — ABNORMAL HIGH (ref 70–99)
Glucose-Capillary: 330 mg/dL — ABNORMAL HIGH (ref 70–99)
Glucose-Capillary: 213 mg/dL — ABNORMAL HIGH (ref 70–99)

## 2019-06-07 LAB — CBC
HCT: 38.5 % — ABNORMAL LOW (ref 39.0–52.0)
Hemoglobin: 12.8 g/dL — ABNORMAL LOW (ref 13.0–17.0)
MCH: 31.1 pg (ref 26.0–34.0)
MCHC: 33.2 g/dL (ref 30.0–36.0)
MCV: 93.7 fL (ref 80.0–100.0)
Platelets: 180 K/uL (ref 150–400)
RBC: 4.11 MIL/uL — ABNORMAL LOW (ref 4.22–5.81)
RDW: 12.7 % (ref 11.5–15.5)
WBC: 5.7 K/uL (ref 4.0–10.5)
nRBC: 0 % (ref 0.0–0.2)

## 2019-06-07 LAB — BASIC METABOLIC PANEL WITH GFR
Anion gap: 9 (ref 5–15)
BUN: 60 mg/dL — ABNORMAL HIGH (ref 8–23)
CO2: 26 mmol/L (ref 22–32)
Calcium: 8 mg/dL — ABNORMAL LOW (ref 8.9–10.3)
Chloride: 109 mmol/L (ref 98–111)
Creatinine, Ser: 1.44 mg/dL — ABNORMAL HIGH (ref 0.61–1.24)
GFR calc Af Amer: 54 mL/min — ABNORMAL LOW
GFR calc non Af Amer: 47 mL/min — ABNORMAL LOW
Glucose, Bld: 108 mg/dL — ABNORMAL HIGH (ref 70–99)
Potassium: 3.6 mmol/L (ref 3.5–5.1)
Sodium: 144 mmol/L (ref 135–145)

## 2019-06-07 LAB — TACROLIMUS LEVEL: Tacrolimus (FK506) - LabCorp: 4.6 ng/mL (ref 2.0–20.0)

## 2019-06-07 LAB — FERRITIN: Ferritin: 760 ng/mL — ABNORMAL HIGH (ref 24–336)

## 2019-06-07 LAB — MRSA PCR SCREENING: MRSA by PCR: NEGATIVE

## 2019-06-07 LAB — C-REACTIVE PROTEIN: CRP: 3.9 mg/dL — ABNORMAL HIGH (ref <1.0)

## 2019-06-07 LAB — D-DIMER, QUANTITATIVE: D-Dimer, Quant: 1.32 ug/mL-FEU — ABNORMAL HIGH (ref 0.00–0.50)

## 2019-06-07 MED ORDER — DEXTROSE-NACL 5-0.45 % IV SOLN: INTRAVENOUS | Status: DC

## 2019-06-07 MED ORDER — LORAZEPAM 2 MG/ML IJ SOLN: 2.0000 mg | INTRAMUSCULAR | Status: DC

## 2019-06-07 MED ORDER — CHLORHEXIDINE GLUCONATE CLOTH 2 % EX PADS
6.0000 | MEDICATED_PAD | Freq: Every day | CUTANEOUS | Status: DC
  Administered 2019-06-07 – 2019-06-12 (×7): 6 via TOPICAL

## 2019-06-07 MED ORDER — DEXMEDETOMIDINE HCL IN NACL 400 MCG/100ML IV SOLN: 0.4000 ug/kg/h | INTRAVENOUS | Status: DC

## 2019-06-07 MED ORDER — CHLORHEXIDINE GLUCONATE 0.12 % MT SOLN
15.0000 mL | Freq: Two times a day (BID) | OROMUCOSAL | Status: DC
  Administered 2019-06-07 – 2019-06-12 (×11): 15 mL via OROMUCOSAL
  Filled 2019-06-07 (×7): qty 15

## 2019-06-07 MED ORDER — ORAL CARE MOUTH RINSE
15.0000 mL | Freq: Two times a day (BID) | OROMUCOSAL | Status: DC
  Administered 2019-06-08 – 2019-06-12 (×10): 15 mL via OROMUCOSAL

## 2019-06-07 MED ORDER — LORAZEPAM 2 MG/ML IJ SOLN
2.0000 mg | Freq: Four times a day (QID) | INTRAMUSCULAR | Status: DC
  Administered 2019-06-08: 08:00:00 2 mg via INTRAVENOUS
  Filled 2019-06-07: qty 1

## 2019-06-07 MED ORDER — DEXMEDETOMIDINE HCL IN NACL 400 MCG/100ML IV SOLN
0.4000 ug/kg/h | INTRAVENOUS | Status: DC
  Administered 2019-06-07 – 2019-06-08 (×3): 0.4 ug/kg/h via INTRAVENOUS
  Administered 2019-06-08 – 2019-06-09 (×3): 1.2 ug/kg/h via INTRAVENOUS
  Administered 2019-06-09: 17:00:00 0.5 ug/kg/h via INTRAVENOUS
  Administered 2019-06-09: 10:00:00 1.2 ug/kg/h via INTRAVENOUS
  Administered 2019-06-10: 20:00:00 1.9 ug/kg/h via INTRAVENOUS
  Administered 2019-06-10: 2 ug/kg/h via INTRAVENOUS
  Administered 2019-06-10: 18:00:00 1.8 ug/kg/h via INTRAVENOUS
  Administered 2019-06-10: 05:00:00 0.9 ug/kg/h via INTRAVENOUS
  Administered 2019-06-10: 15:00:00 1.8 ug/kg/h via INTRAVENOUS
  Administered 2019-06-10: 0.9 ug/kg/h via INTRAVENOUS
  Administered 2019-06-11 (×3): 2 ug/kg/h via INTRAVENOUS
  Administered 2019-06-11: 17:00:00 0.8 ug/kg/h via INTRAVENOUS
  Administered 2019-06-11: 1 ug/kg/h via INTRAVENOUS
  Administered 2019-06-11: 05:00:00 2 ug/kg/h via INTRAVENOUS
  Administered 2019-06-11: 3 ug/kg/h via INTRAVENOUS
  Administered 2019-06-11: 01:00:00 2 ug/kg/h via INTRAVENOUS
  Administered 2019-06-12: 07:00:00 1.8 ug/kg/h via INTRAVENOUS
  Administered 2019-06-12: 14:00:00 1.4 ug/kg/h via INTRAVENOUS
  Administered 2019-06-12: 20:00:00 1.1 ug/kg/h via INTRAVENOUS
  Administered 2019-06-12: 2 ug/kg/h via INTRAVENOUS
  Administered 2019-06-12: 1.2 ug/kg/h via INTRAVENOUS
  Administered 2019-06-12: 05:00:00 1.6 ug/kg/h via INTRAVENOUS
  Administered 2019-06-12: 12:00:00 1.8 ug/kg/h via INTRAVENOUS
  Administered 2019-06-12: 11:00:00 2 ug/kg/h via INTRAVENOUS
  Filled 2019-06-07 (×14): qty 100
  Filled 2019-06-07: qty 200
  Filled 2019-06-07 (×11): qty 100
  Filled 2019-06-07: qty 200
  Filled 2019-06-07 (×3): qty 100

## 2019-06-07 NOTE — Progress Notes (Signed)
Patient ID: Brandon [name], male   DOB: 1941-03-16, 79 y.o.   MRN: [id number]          [facility] for Infectious Disease                   Date of Admission:  06/05/2019                                     Day 5 remdesivir and steroids                                                                                     Day 4 ceftriaxone    Brandon [name] has COVID-19 pneumonia.  He had low-grade fever on admission.  Blood cultures grew Streptococcus mitis/oralis in 1 set.  Repeat blood cultures are negative.  There is no evidence of valvular or pacemaker endocarditis by TTE.  I would not pursue TEE at this time.  I recommend continuing ceftriaxone for a total of 7 days.  Plan: 1. Continue ceftriaxone for 3 more days.  I have placed a stop order. 2. Please call if we can be of further assistance                                                                                       Michel Bickers, MD [facility] [place] [phone] pager   [phone] cell 06/05/2019, 2:22 PM

## 2019-06-07 NOTE — Progress Notes (Signed)
Results for KAIL, VISCONTI (MRN [id number]) as of 06/07/2019 14:58  Ref. Range 06/06/2019 11:33 06/06/2019 16:31 06/06/2019 20:28 06/07/2019 07:29 06/07/2019 11:49  Glucose-Capillary Latest Ref Range: 70 - 99 mg/dL 378 (H) 202 (H) 78 200 (H) 318 (H)  Note that postprandial blood sugars continue to be elevated and greater than 200 mg/dl.  Recommend adding Novolog 5 units TID with meals if patient eats at least 50% of meal and if blood sugars continue to be elevated. May need to titrate dosages as needed.   Harvel Ricks RN BSN CDE Diabetes Coordinator Pager: [phone]  8am-5pm

## 2019-06-07 NOTE — Progress Notes (Signed)
PROGRESS NOTE    Brandon [name]  [id number] DOB: [birth year] DOA: 06/18/2019 PCP: Leanna Battles, MD    Brief Narrative:   Brandon [name] is a 79 y.o. male with history of cardiac transplant, diabetes mellitus type 2, hypertension, pacemaker, CVA, chronic kidney disease stage III was recently admitted after a fall at that time patient was diagnosed with COVID-19 infection was discharged home after physical therapy on June 01, 2019.  For the last 2 days per patient's wife patient has not been doing well not eating well and had some diarrhea and was increasingly confused.  Patient blood sugar was running low and EMS was called.  After patient was given some food blood sugar improved but since patient was getting more weak he was brought to the ER.  In the ER patient was hypotensive with lactic acid of 4.6 CRP 22.6 creatinine worsened to 1.9 from 1.6 sodium 134 procalcitonin 0.19 CBC largely unremarkable except for hemoglobin 17.1 D-dimer 1.83 INR 1.1 UA chest x-ray unremarkable.  CT head unremarkable. Patient is oriented to his name and place after patient was started on fluids and empiric antibiotics for sepsis.  Prior to that patient was confused.  Patient admitted for sepsis with Covid infection.  EKG shows sinus tachycardia. Patient's blood pressure improved with fluids and stress dose steroids.   Assessment & Plan:   Principal Problem:   COVID-19 Active Problems:   Sepsis ([place])   Heart transplanted ([place])   CAD (coronary artery disease)   Essential hypertension   Type 2 diabetes mellitus with stage 3 chronic kidney disease (HCC)   Encephalopathy   Acute kidney injury superimposed on chronic kidney disease ([place])   Positive blood culture   Acute metabolic encephalopathy Etiology likely multifactorial given staph aureus septicemia combined with hypoxic respiratory failure secondary to Covid-19 viral pneumonia.  CT head with chronic ischemic microangiopathy and old right  frontal lobe infarct without acute intracranial abnormality.  Continue treatment with ceftriaxone for septicemia and remdesivir/steroids for Covid-pneumonia as below.  Sepsis, present on admission Lactic acidosis Streptococcus mitis/oralis septicemia Blood cultures 2 out of 4 positive for GPC's.  Complicated by history of pacemaker placement.  Discussed with infectious disease, Dr. Megan Salon on 06/05/2019, and believes he could have a true bacteremia with methicillin sensitive coagulase-negative Staphylococcus. TTE 2/3 with EF [id number], grade 1 diastolic dysfunction, LA mildly dilated, normal IVC, valves not well seen but no obvious vegetations. --ID now signed off 06/07/2019 --Lactic acid 4.6-->1.6 --Continue ceftriaxone 2 g IV every 24 hours; plan 7 day course per ID --Repeat blood cultures 06/05/2019 negative to date  Acute hypoxic respiratory failure secondary to acute Covid-19 viral pneumonia during the ongoing 2020 Covid 19 Pandemic - POA Diagnosed 05/31/2019.  Patient presents with progressive dyspnea, found to be hypoxic on room air.  0.83, fibrinogen 750, CRP 22.6.  Chest x-ray with progressive bibasilar interstitial and groundglass opacities consistent with multifocal pneumonia. --ddimer 1.83-->3.60-->1.65-->1.88-->1.32 --CRP 22.6-->16.1-->12.4-->7.5-->3.9 --Continue remdesivir, plan 5-day course; day #4/5 --Continue Solu-Medrol 60 mg every 12 hours --Continue supplemental oxygen, titrate to maintain SPO2 greater than 92%; currently on 15 L heated high flow nasal cannula and 15L NRB --Continue supportive care with vitamin C, zinc, Tylenol, Dulera MDI --Follow CBC, CMP, D-dimer, ferritin, and CRP daily --Continue airborne/contact isolation precautions  EtOH abuse/withdrawl Patient endorses 1.5 bottles of wine daily.  Patient initially started to be tremulous which progressed to agitation and combativeness. --Has utilized 17 mg of Ativan past 24 hours --CIWAA protocol with symptom triggered  Ativan --  Continue nonviolent restraints with mittens as he has been pulling at medical equipment; IV lines and pulling off oxygen devices mask/nasal cannula --Folic acid, thiamine, multivitamin  History of cardiac transplant Patient on mycophenolate 1000 mg p.o. twice daily, prednisone 5 mg p.o. daily, tacrolimus 1 mg p.o. twice daily at home. Follows with [facility] cardiology/transplant team. --Continue tacrolimus 1 mg p.o. BID --On high-dose steroids as above for Covid-19 pneumonia --Holding mycophenolate per transplant team, plan restart on 06/14/2019  Type 2 diabetes mellitus Hemoglobin A1c 9.0, not optimally controlled.  On Lantus 24 units subcutaneously daily, Humalog 15 units subcutaneously daily with lunch. --Continue Levemir to 25 units subcutaneously daily today --Insulin sliding scale for further coverage  Acute on chronic kidney disease stage III Baseline creatinine 1.6, creatinine 2.0 on admission --Cr 2.02-->1.29-->1.44 --Starting D5 half-normal saline at 75 mL's per hour given his current mental status and upward trend of creatinine --Continue to hold home lisinopril --Avoid nephrotoxins, renally dose all medications --Monitor renal function daily  Essential hypertension On lisinopril 5 mg p.o. daily, propranolol 10 mg p.o. twice daily at home.  Patient was hypotensive on presentation. --BP 140/82 today --Continue propranolol 10 mg p.o. twice daily  --Continue to hold home lisinopril for now --Monitor blood pressure closely  Weakness, debility: --OT currently recommends SNF with PT recommending home health, given his current mental status, will continue therapy while inpatient and await final recommendations when more ready for discharge.   DVT prophylaxis: Heparin Code Status: Full code Family Communication: Update patient's spouse, [name] via telephone this morning Disposition Plan: From home with spouse, to be determined, continues require IV antibiotics per ID to  complete 7-day course, continues on high FiO2 with heated high flow nasal cannula and NRB, also with active EtOH withdrawals, unsafe for discharge at this time   Consultants:   ID -  Dr. Megan Salon - signed off 06/07/2019  Procedures:   TTE 06/05/2019 IMPRESSIONS    1. Left ventricular ejection fraction, by visual estimation, is 65 to  70%. The left ventricle has hyperdynamic function. There is no left  ventricular hypertrophy.  2. Left ventricular diastolic parameters are consistent with Grade I  diastolic dysfunction (impaired relaxation).  3. The left ventricle has no regional wall motion abnormalities.  4. Global right ventricle has normal systolic function.The right  ventricular size is normal. No increase in right ventricular wall  thickness.  5. Left atrial size was mildly dilated.  6. Right atrial size was normal.  7. The mitral valve is normal in structure. No evidence of mitral valve  regurgitation.  8. The tricuspid valve is grossly normal.  9. The tricuspid valve is grossly normal. Tricuspid valve regurgitation  is not demonstrated.  10. The aortic valve is grossly normal. Aortic valve regurgitation is not  visualized.  11. The pulmonic valve was grossly normal. Pulmonic valve regurgitation is  not visualized.  12. The inferior vena cava is normal in size with greater than 50%  respiratory variability, suggesting right atrial pressure of 3 mmHg.  13. The interatrial septum was not wll visualized.  14. Interpretation limited due to poor sound wave transmission. LVEF is  normal. LV appears underfilled. Valves not seen well but no obvious  vegetations noted.limited due to poor sound wave trans,mis   COVID Treatment:   Remdesivir   solumedrol   Subjective: Patient seen and examined bedside, unresponsive to verbal stimuli.  Has significant withdrawal symptoms overnight, has received 17 mg of Ativan past 24 hours.  Has been pulling at  medical devices to  include IV lines, oxygen delivery devices such as mask/nasal cannula and currently in mittens for protection.  Continues on high FiO2 requirements, although inflammatory markers are improved; likely combination of his Covid-19 viral pneumonia and active EtOH withdrawal.  Unable to obtain any further ROS from patient given his current mental status. No other acute events overnight per nursing staff.  Objective: Vitals:   06/07/19 0729 06/07/19 0730 06/07/19 0905 06/07/19 1000  BP:  (!) 113/97 108/74 107/75  Pulse:  90 88 (!) 101  Resp:  (!) 24 (!) 24   Temp:  97.9 F (36.6 C)    TempSrc:  Axillary    SpO2: 93% 93% 92%   Weight:      Height:        Intake/Output Summary (Last 24 hours) at 06/07/2019 1135 Last data filed at 06/07/2019 1100 Gross per 24 hour  Intake 680 ml  Output 400 ml  Net 280 ml   Filed Weights   06/12/2019 2239 06/04/19 0500 06/06/19 0500  Weight: 90.7 kg 85.3 kg 88.3 kg    Examination:  General exam: Unresponsive to verbal stimuli, obtunded Respiratory system: Clear to auscultation. Respiratory effort normal.  On 15 L heated high flow nasal cannula with 15 L NRB with SPO2 88% Cardiovascular system: S1 & S2 heard, RRR. No JVD, murmurs, rubs, gallops or clicks. No pedal edema. Gastrointestinal system: Abdomen is nondistended, soft and nontender. No organomegaly or masses felt. Normal bowel sounds heard. Central nervous system: Moves all extremities independently, obtunded; withdraws to painful stimuli Extremities: Moves all extremities independently Skin: No rashes, lesions or ulcers Psychiatry: Unable to assess secondary to current mental status.    Data Reviewed: I have personally reviewed following labs and imaging studies  CBC: Recent Labs  Lab 06/01/19 0625 06/01/19 0625 06/19/2019 2257 06/04/19 0444 06/05/19 0205 06/06/19 0225 06/07/19 0137  WBC 3.3*   < > 6.7 4.5 5.0 8.8 5.7  NEUTROABS 2.3  --  5.6 3.7  --   --   --   HGB 14.5   < > 17.1* 13.2  13.5 14.3 12.8*  HCT 44.6   < > 52.2* 39.2 40.5 42.9 38.5*  MCV 93.9   < > 93.2 92.9 91.8 91.5 93.7  PLT 86*   < > 162 107* 134* 234 180   < > = values in this interval not displayed.   Basic Metabolic Panel: Recent Labs  Lab 06/01/19 0625 06/14/2019 2257 06/04/19 0444 06/06/19 0225 06/07/19 0137  NA 130* 134* 135 142 144  K 4.2 4.2 4.1 3.3* 3.6  CL 97* 98 104 102 109  CO2 20* 22 18* 25 26  GLUCOSE 284* 88 200* 113* 108*  BUN 33* 44* 44* 55* 60*  CREATININE 1.68* 1.94* 2.02* 1.29* 1.44*  CALCIUM 8.5* 9.3 7.6* 8.4* 8.0*  MG 1.7  --   --   --   --    GFR: Estimated Creatinine Clearance: 47.2 mL/min (A) (by C-G formula based on SCr of 1.44 mg/dL (H)). Liver Function Tests: Recent Labs  Lab 06/01/19 0625 06/12/2019 2257 06/04/19 0444  AST 24 27 19   ALT 22 23 19   ALKPHOS 60 66 48  BILITOT 1.1 1.4* 0.7  PROT 6.2* 7.6 5.6*  ALBUMIN 3.6 4.2 3.0*   No results for input(s): LIPASE, AMYLASE in the last 168 hours. No results for input(s): AMMONIA in the last 168 hours. Coagulation Profile: Recent Labs  Lab 06/23/2019 2257  INR 1.1   Cardiac Enzymes:  No results for input(s): CKTOTAL, CKMB, CKMBINDEX, TROPONINI in the last 168 hours. BNP (last 3 results) No results for input(s): PROBNP in the last 8760 hours. HbA1C: Recent Labs    06/05/19 0205  HGBA1C 9.0*   CBG: Recent Labs  Lab 06/06/19 0732 06/06/19 1133 06/06/19 1631 06/06/19 2028 06/07/19 0729  GLUCAP 196* 378* 202* 78 200*   Lipid Profile: No results for input(s): CHOL, HDL, LDLCALC, TRIG, CHOLHDL, LDLDIRECT in the last 72 hours. Thyroid Function Tests: No results for input(s): TSH, T4TOTAL, FREET4, T3FREE, THYROIDAB in the last 72 hours. Anemia Panel: Recent Labs    06/06/19 0225 06/07/19 0137  FERRITIN 1,077* 760*   Sepsis Labs: Recent Labs  Lab 06/10/2019 2257 06/10/2019 2257 06/04/19 0110 06/04/19 0444 06/04/19 0706 06/04/19 1155  PROCALCITON 0.19  --   --  0.24  --  0.24  LATICACIDVEN 4.6*    < > 2.7* 1.7 2.6* 1.6   < > = values in this interval not displayed.    Recent Results (from the past 240 hour(s))  Blood Culture (routine x 2)     Status: None   Collection Time: 05/30/19 11:45 PM   Specimen: BLOOD  Result Value Ref Range Status   Specimen Description BLOOD LEFT ARM  Final   Special Requests   Final    BOTTLES DRAWN AEROBIC AND ANAEROBIC Blood Culture adequate volume   Culture   Final    NO GROWTH 5 DAYS Performed at [facility], [address], [place], [place] [address]    Report Status 06/05/2019 FINAL  Final  Blood Culture (routine x 2)     Status: None   Collection Time: 05/30/19 11:50 PM   Specimen: BLOOD  Result Value Ref Range Status   Specimen Description BLOOD LEFT HAND  Final   Special Requests   Final    BOTTLES DRAWN AEROBIC ONLY Blood Culture adequate volume   Culture   Final    NO GROWTH 5 DAYS Performed at [facility], [place] [address], [place], [place] [address]    Report Status 06/05/2019 FINAL  Final  Urine culture     Status: Abnormal   Collection Time: 05/31/19 12:37 AM   Specimen: In/Out Cath Urine  Result Value Ref Range Status   Specimen Description IN/OUT CATH URINE  Final   Special Requests   Final    NONE Performed at [facility], [place] [address] [place], [place] [address]    Culture 20,000 COLONIES/mL ESCHERICHIA COLI (A)  Final   Report Status 06/01/2019 FINAL  Final   Organism ID, Bacteria ESCHERICHIA COLI (A)  Final      Susceptibility   Escherichia coli - MIC*    AMPICILLIN <=2 SENSITIVE Sensitive     CEFAZOLIN <=4 SENSITIVE Sensitive     CEFTRIAXONE <=0.25 SENSITIVE Sensitive     CIPROFLOXACIN <=0.25 SENSITIVE Sensitive     GENTAMICIN <=1 SENSITIVE Sensitive     IMIPENEM <=0.25 SENSITIVE Sensitive     NITROFURANTOIN <=16 SENSITIVE Sensitive     TRIMETH/SULFA <=20 SENSITIVE Sensitive     AMPICILLIN/SULBACTAM <=2 SENSITIVE Sensitive     PIP/TAZO <=4 SENSITIVE Sensitive     * 20,000 COLONIES/mL  ESCHERICHIA COLI  Culture, blood (Routine x 2)     Status: Abnormal   Collection Time: 06/26/2019 10:57 PM   Specimen: BLOOD  Result Value Ref Range Status   Specimen Description BLOOD LEFT ARM  Final   Special Requests   Final  BOTTLES DRAWN AEROBIC AND ANAEROBIC Blood Culture adequate volume   Culture  Setup Time   Final    GRAM POSITIVE COCCI IN PAIRS IN BOTH AEROBIC AND ANAEROBIC BOTTLES CRITICAL RESULT CALLED TO, READ BACK BY AND VERIFIED WITH: Cassandra [id number] 06/04/19 A BROWNING Performed at [facility], [place] [address], [place], [place] [address]    Culture STREPTOCOCCUS MITIS/ORALIS (A)  Final   Report Status 06/06/2019 FINAL  Final   Organism ID, Bacteria STREPTOCOCCUS MITIS/ORALIS  Final      Susceptibility   Streptococcus mitis/oralis - MIC*    TETRACYCLINE 4 SENSITIVE Sensitive     VANCOMYCIN 0.5 SENSITIVE Sensitive     CLINDAMYCIN >=1 RESISTANT Resistant     PENICILLIN Value in next row Sensitive      SENSITIVE<=0.06    CEFTRIAXONE Value in next row Sensitive      SENSITIVE<=0.12    * STREPTOCOCCUS MITIS/ORALIS  Urine culture     Status: Abnormal   Collection Time: 06/30/2019 11:38 PM   Specimen: Urine, Clean Catch  Result Value Ref Range Status   Specimen Description URINE, CLEAN CATCH  Final   Special Requests   Final    NONE Performed at [facility], [place] [address] [place], [place] [address]    Culture MULTIPLE SPECIES PRESENT, SUGGEST RECOLLECTION (A)  Final   Report Status 06/04/2019 FINAL  Final  Culture, blood (Routine x 2)     Status: Abnormal   Collection Time: 06/04/19  1:10 AM   Specimen: BLOOD  Result Value Ref Range Status   Specimen Description BLOOD RIGHT FOREARM  Final   Special Requests   Final    BOTTLES DRAWN AEROBIC AND ANAEROBIC Blood Culture adequate volume   Culture  Setup Time   Final    ANAEROBIC BOTTLE ONLY GRAM POSITIVE COCCI CRITICAL RESULT CALLED TO, READ BACK BY AND VERIFIED WITH: Deborra Medina [facility] 06/04/19 2254  JDW Performed at [facility], [place] [address] [place], [place] [address]    Culture STAPHYLOCOCCUS EPIDERMIDIS (A)  Final   Report Status 06/06/2019 FINAL  Final   Organism ID, Bacteria STAPHYLOCOCCUS EPIDERMIDIS  Final      Susceptibility   Staphylococcus epidermidis - MIC*    CIPROFLOXACIN <=0.5 SENSITIVE Sensitive     ERYTHROMYCIN <=0.25 SENSITIVE Sensitive     GENTAMICIN <=0.5 SENSITIVE Sensitive     OXACILLIN <=0.25 SENSITIVE Sensitive     TETRACYCLINE >=16 RESISTANT Resistant     VANCOMYCIN 1 SENSITIVE Sensitive     TRIMETH/SULFA <=10 SENSITIVE Sensitive     CLINDAMYCIN <=0.25 SENSITIVE Sensitive     RIFAMPIN <=0.5 SENSITIVE Sensitive     Inducible Clindamycin NEGATIVE Sensitive     * STAPHYLOCOCCUS EPIDERMIDIS  Blood Culture ID Panel (Reflexed)     Status: Abnormal   Collection Time: 06/04/19  1:10 AM  Result Value Ref Range Status   Enterococcus species NOT DETECTED NOT DETECTED Final   Listeria monocytogenes NOT DETECTED NOT DETECTED Final   Staphylococcus species DETECTED (A) NOT DETECTED Final    Comment: Methicillin (oxacillin) susceptible coagulase negative staphylococcus. Possible blood culture contaminant (unless isolated from more than one blood culture draw or clinical case suggests pathogenicity). No antibiotic treatment is indicated for blood  culture contaminants. CRITICAL RESULT CALLED TO, READ BACK BY AND VERIFIED WITH: T MEYER PHARMD 06/04/19 2254 JDW    Staphylococcus aureus (BCID) NOT DETECTED NOT DETECTED Final   Methicillin resistance NOT DETECTED NOT DETECTED Final  Streptococcus species NOT DETECTED NOT DETECTED Final   Streptococcus agalactiae NOT DETECTED NOT DETECTED Final   Streptococcus pneumoniae NOT DETECTED NOT DETECTED Final   Streptococcus pyogenes NOT DETECTED NOT DETECTED Final   Acinetobacter baumannii NOT DETECTED NOT DETECTED Final   Enterobacteriaceae species NOT DETECTED NOT DETECTED Final   Enterobacter cloacae complex NOT  DETECTED NOT DETECTED Final   Escherichia coli NOT DETECTED NOT DETECTED Final   Klebsiella oxytoca NOT DETECTED NOT DETECTED Final   Klebsiella pneumoniae NOT DETECTED NOT DETECTED Final   Proteus species NOT DETECTED NOT DETECTED Final   Serratia marcescens NOT DETECTED NOT DETECTED Final   Haemophilus influenzae NOT DETECTED NOT DETECTED Final   Neisseria meningitidis NOT DETECTED NOT DETECTED Final   Pseudomonas aeruginosa NOT DETECTED NOT DETECTED Final   Candida albicans NOT DETECTED NOT DETECTED Final   Candida glabrata NOT DETECTED NOT DETECTED Final   Candida krusei NOT DETECTED NOT DETECTED Final   Candida parapsilosis NOT DETECTED NOT DETECTED Final   Candida tropicalis NOT DETECTED NOT DETECTED Final    Comment: Performed at [facility], [place] [address] [place], [place] [address]  MRSA PCR Screening     Status: None   Collection Time: 06/04/19  8:35 AM   Specimen: Nasopharyngeal  Result Value Ref Range Status   MRSA by PCR NEGATIVE NEGATIVE Final    Comment:        The GeneXpert MRSA Assay (FDA approved for NASAL specimens only), is one component of a comprehensive MRSA colonization surveillance program. It is not intended to diagnose MRSA infection nor to guide or monitor treatment for MRSA infections. Performed at [facility], [place] [address] [place], [place] [address]   Culture, blood (routine x 2)     Status: None (Preliminary result)   Collection Time: 06/05/19  3:00 PM   Specimen: BLOOD  Result Value Ref Range Status   Specimen Description   Final    BLOOD LEFT HAND Performed at [facility] [address], [place], [place] [address]    Special Requests   Final    BOTTLES DRAWN AEROBIC ONLY Blood Culture results may not be optimal due to an inadequate volume of blood received in culture bottles Performed at [facility] [address], [place], [place] [address]    Culture   Final    NO GROWTH < 24  HOURS Performed at [facility] [address] [place], [place] [address]    Report Status PENDING  Incomplete  Culture, blood (routine x 2)     Status: None (Preliminary result)   Collection Time: 06/05/19  3:06 PM   Specimen: BLOOD  Result Value Ref Range Status   Specimen Description   Final    BLOOD RIGHT HAND Performed at [facility] [address], [place], [place] [address]    Special Requests   Final    BOTTLES DRAWN AEROBIC ONLY Blood Culture adequate volume Performed at [facility] [address], [place], [place] [address]    Culture   Final    NO GROWTH < 24 HOURS Performed at [facility] [address] [place], [place] [address]    Report Status PENDING  Incomplete         Radiology Studies: ECHOCARDIOGRAM COMPLETE  Result Date: 06/05/2019   ECHOCARDIOGRAM REPORT   Patient Name:   RAKEIM WARHURST Date of Exam: 06/05/2019 Medical Rec #:  [id number]  Height:       69.0 in Accession #:    [id number]      Weight:       188.1 lb Date of Birth:  Jun 03, 1940      BSA:          2.01 m Patient Age:    28 years        BP:           130/54 mmHg Patient Gender: M               HR:           98 bpm. Exam Location:  Inpatient Procedure: 2D Echo, Cardiac Doppler and Color Doppler Indications:    Bacteremia  History:        Patient has no prior history of Echocardiogram examinations.                 Pacemaker, Stroke; Risk Factors:Hypertension and Diabetes. Heart                 Transplant.  Sonographer:    Jannett Celestine RDCS (AE) Referring Phys: [id number] Franz Svec J [place]  Sonographer Comments: Covid positive. IMPRESSIONS  1. Left ventricular ejection fraction, by visual estimation, is 65 to 70%. The left ventricle has hyperdynamic function. There is no left ventricular hypertrophy.  2. Left ventricular diastolic parameters are consistent with Grade I diastolic dysfunction (impaired relaxation).  3. The left ventricle has no regional  wall motion abnormalities.  4. Global right ventricle has normal systolic function.The right ventricular size is normal. No increase in right ventricular wall thickness.  5. Left atrial size was mildly dilated.  6. Right atrial size was normal.  7. The mitral valve is normal in structure. No evidence of mitral valve regurgitation.  8. The tricuspid valve is grossly normal.  9. The tricuspid valve is grossly normal. Tricuspid valve regurgitation is not demonstrated. 10. The aortic valve is grossly normal. Aortic valve regurgitation is not visualized. 11. The pulmonic valve was grossly normal. Pulmonic valve regurgitation is not visualized. 12. The inferior vena cava is normal in size with greater than 50% respiratory variability, suggesting right atrial pressure of 3 mmHg. 13. The interatrial septum was not wll visualized. 14. Interpretation limited due to poor sound wave transmission. LVEF is normal. LV appears underfilled. Valves not seen well but no obvious vegetations noted.limited due to poor sound wave trans,mis FINDINGS  Left Ventricle: Left ventricular ejection fraction, by visual estimation, is 65 to 70%. The left ventricle has hyperdynamic function. The left ventricle has no regional wall motion abnormalities. There is no left ventricular hypertrophy. Left ventricular diastolic parameters are consistent with Grade I diastolic dysfunction (impaired relaxation). Right Ventricle: The right ventricular size is normal. No increase in right ventricular wall thickness. Global RV systolic function is has normal systolic function. Left Atrium: Left atrial size was mildly dilated. Right Atrium: Right atrial size was normal in size Pericardium: There is no evidence of pericardial effusion. Mitral Valve: The mitral valve is normal in structure. No evidence of mitral valve regurgitation. Tricuspid Valve: The tricuspid valve is grossly normal. Tricuspid valve regurgitation is not demonstrated. Aortic Valve: The aortic  valve is grossly normal. Aortic valve regurgitation is not visualized. Pulmonic Valve: The pulmonic valve was grossly normal. Pulmonic valve regurgitation is not visualized. Pulmonic regurgitation is not visualized. Aorta: The aortic root and ascending aorta are structurally normal, with no evidence of dilitation. Venous: The inferior vena cava is normal in size with  greater than 50% respiratory variability, suggesting right atrial pressure of 3 mmHg. IAS/Shunts: The interatrial septum was not well visualized.  LEFT VENTRICLE PLAX 2D LVIDd:         2.88 cm  Diastology LVIDs:         1.99 cm  LV e' lateral:   10.10 cm/s LV PW:         1.06 cm  LV E/e' lateral: 7.9 LV IVS:        1.12 cm LVOT diam:     1.80 cm LV SV:         19 ml LV SV Index:   9.29 LVOT Area:     2.54 cm  LEFT ATRIUM             Index LA diam:        4.20 cm 2.09 cm/m LA Vol (A2C):   40.2 ml 19.97 ml/m LA Vol (A4C):   52.1 ml 25.89 ml/m LA Biplane Vol: 47.6 ml 23.65 ml/m  AORTIC VALVE LVOT Vmax:   57.60 cm/s LVOT Vmean:  45.500 cm/s LVOT VTI:    0.142 m  AORTA Ao Root diam: 2.90 cm MITRAL VALVE MV Area (PHT): 4.15 cm             SHUNTS MV PHT:        53.07 msec           Systemic VTI:  0.14 m MV Decel Time: 183 msec             Systemic Diam: 1.80 cm MV E velocity: 79.50 cm/s 103 cm/s MV A velocity: 47.50 cm/s 70.3 cm/s MV E/A ratio:  1.67       1.5  Glori Bickers MD Electronically signed by Glori Bickers MD Signature Date/Time: 06/05/2019/5:45:09 PM    Final         Scheduled Meds: . allopurinol  300 mg Oral Daily  . vitamin C  500 mg Oral Daily  . clopidogrel  75 mg Oral QHS  . enoxaparin (LOVENOX) injection  40 mg Subcutaneous Q24H  . folic acid  1 mg Oral Daily  . insulin aspart  0-15 Units Subcutaneous TID WC  . insulin aspart  0-5 Units Subcutaneous QHS  . insulin detemir  25 Units Subcutaneous Daily  . Ipratropium-Albuterol  1 puff Inhalation Q6H  . latanoprost  1 drop Both Eyes QHS  . methylPREDNISolone  (SOLU-MEDROL) injection  60 mg Intravenous Q12H  . mometasone-formoterol  2 puff Inhalation BID  . multivitamin with minerals  1 tablet Oral Daily  . [START ON 06/14/2019] mycophenolate  1,000 mg Oral BID  . pravastatin  20 mg Oral QHS  . propranolol  10 mg Oral BID AC  . tacrolimus  1 mg Oral BID  . thiamine  100 mg Oral Daily   Or  . thiamine  100 mg Intravenous Daily  . zinc sulfate  220 mg Oral Daily   Continuous Infusions: . cefTRIAXone (ROCEPHIN)  IV 2 g (06/06/19 1711)  . dextrose 5 % and 0.45% NaCl 75 mL/hr at 06/07/19 0945  . remdesivir 100 mg in NS 100 mL 100 mg (06/07/19 1036)     LOS: 3 days    Time spent: 42 minutes spent on chart review, discussion with nursing staff, consultants, updating family and interview/physical exam; more than 50% of that time was spent in counseling and/or coordination of care.    Kennard Fildes J [place] DO [facility] Available via Epic secure chat 7am-7pm After  these hours, please refer to coverage provider listed on [email or web address] 06/07/2019, 11:35 AM

## 2019-06-07 NOTE — Consult Note (Addendum)
NAME:  Brandon [name], MRN:  [id number], DOB:  09-16-1940, LOS: 3 ADMISSION DATE:  06/08/2019, CONSULTATION DATE:  06/06/2018 REFERRING MD:  Eric [place] MD, CHIEF COMPLAINT: Encephalopathy, EtOH withdrawal  Brief History   79 year old with history of cardiac transplant, diabetes, hypertension, pacemaker, CVA, chronic kidney disease admitted with COVID-19 infection on 2/1.  Hospital course complicated by sepsis, lactic acidosis, staph, strep bacteremia.  Treated with remdesivir, Solu-Medrol and antibiotics  Transferred to ICU on 2/5 for increasing encephalopathy, agitation, EtOH withdrawal requiring multiple doses of Ativan. PCCM consulted for Precedex Patient drinks 1.5 bottles of wine daily  Past Medical History    has a past medical history of Diabetes mellitus without complication ([place]), Hypertension, Pacemaker, and Stroke ([place]).  CKD, cardiac transplant   Significant Hospital Events   2/1-admit  Consults:  PCCM  Procedures:    Significant Diagnostic Tests:  CT head 2/2-no acute abnormality, chronic stroke, ischemic microangiopathy  Micro Data:  Urine culture 1/29-E. coli Blood cultures 2/1-Streptococcus Blood cultures 2/2-staph epidermidis  Antimicrobials:  Ceftriaxone Remdesivir Solu-Medrol  Interim history/subjective:    Objective   Blood pressure (!) 143/82, pulse (!) 101, temperature 98.2 F (36.8 C), temperature source Axillary, resp. rate (!) 24, height 5\' 9"  (1.753 m), weight 88.3 kg, SpO2 91 %.    FiO2 (%):  [100 %] 100 %   Intake/Output Summary (Last 24 hours) at 06/07/2019 1903 Last data filed at 06/07/2019 1822 Gross per 24 hour  Intake 700 ml  Output 850 ml  Net -150 ml   Filed Weights   06/08/2019 2239 06/04/19 0500 06/06/19 0500  Weight: 90.7 kg 85.3 kg 88.3 kg    Examination: Blood pressure (!) 143/82, pulse (!) 101, temperature 98.2 F (36.8 C), temperature source Axillary, resp. rate (!) 24, height 5\' 9"  (1.753 m), weight 88.3 kg, SpO2 91  %. Gen:      No acute distress HEENT:  EOMI, sclera anicteric Neck:     No masses; no thyromegaly Lungs:    Clear to auscultation bilaterally; normal respiratory effort CV:         Regular rate and rhythm; no murmurs Abd:      + bowel sounds; soft, non-tender; no palpable masses, no distension Ext:    No edema; adequate peripheral perfusion Skin:      Warm and dry; no rash Neuro: Agitated, tremulous, confused.  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 Standing ativan Start Precedex drip  Staph bacteremia Continue antibiotics per primary  Heart transplant Continue CellCept, Prograf   Best practice:  Diet: NPO Pain/Anxiety/Delirium protocol (if indicated): NA VAP protocol (if indicated): NA DVT prophylaxis: Lovenox GI prophylaxis: NA Glucose control: SSI, insulin Mobility: Bed Code Status: Full Family Communication: per primary Disposition: ICU  Labs   CBC: Recent Labs  Lab 06/01/19 0625 06/01/19 0625 06/17/2019 2257 06/04/19 0444 06/05/19 0205 06/06/19 0225 06/07/19 0137  WBC 3.3*   < > 6.7 4.5 5.0 8.8 5.7  NEUTROABS 2.3  --  5.6 3.7  --   --   --   HGB 14.5   < > 17.1* 13.2 13.5 14.3 12.8*  HCT 44.6   < > 52.2* 39.2 40.5 42.9 38.5*  MCV 93.9   < > 93.2 92.9 91.8 91.5 93.7  PLT 86*   < > 162 107* 134* 234 180   < > =  values in this interval not displayed.    Basic Metabolic Panel: Recent Labs  Lab 06/01/19 0625 06/09/2019 2257 06/04/19 0444 06/06/19 0225 06/07/19 0137  NA 130* 134* 135 142 144  K 4.2 4.2 4.1 3.3* 3.6  CL 97* 98 104 102 109  CO2 20* 22 18* 25 26  GLUCOSE 284* 88 200* 113* 108*  BUN 33* 44* 44* 55* 60*  CREATININE 1.68* 1.94* 2.02* 1.29* 1.44*  CALCIUM 8.5* 9.3 7.6* 8.4* 8.0*  MG 1.7  --   --   --   --    GFR: Estimated Creatinine  Clearance: 47.2 mL/min (A) (by C-G formula based on SCr of 1.44 mg/dL (H)). Recent Labs  Lab 06/30/2019 2257 06/16/2019 2257 06/04/19 0110 06/04/19 0444 06/04/19 0706 06/04/19 1155 06/05/19 0205 06/06/19 0225 06/07/19 0137  PROCALCITON 0.19  --   --  0.24  --  0.24  --   --   --   WBC 6.7   < >  --  4.5  --   --  5.0 8.8 5.7  LATICACIDVEN 4.6*   < > 2.7* 1.7 2.6* 1.6  --   --   --    < > = values in this interval not displayed.    Liver Function Tests: Recent Labs  Lab 06/01/19 0625 06/09/2019 2257 06/04/19 0444  AST 24 27 19   ALT 22 23 19   ALKPHOS 60 66 48  BILITOT 1.1 1.4* 0.7  PROT 6.2* 7.6 5.6*  ALBUMIN 3.6 4.2 3.0*   No results for input(s): LIPASE, AMYLASE in the last 168 hours. No results for input(s): AMMONIA in the last 168 hours.  ABG No results found for: PHART, PCO2ART, PO2ART, HCO3, TCO2, ACIDBASEDEF, O2SAT   Coagulation Profile: Recent Labs  Lab 06/17/2019 2257  INR 1.1    Cardiac Enzymes: No results for input(s): CKTOTAL, CKMB, CKMBINDEX, TROPONINI in the last 168 hours.  HbA1C: Hgb A1c MFr Bld  Date/Time Value Ref Range Status  06/05/2019 02:05 AM 9.0 (H) 4.8 - 5.6 % Final    Comment:    (NOTE) Pre diabetes:          5.7%-6.4% Diabetes:              >6.4% Glycemic control for   <7.0% adults with diabetes   05/31/2019 02:23 AM 8.3 (H) 4.8 - 5.6 % Final    Comment:    (NOTE) Pre diabetes:          5.7%-6.4% Diabetes:              >6.4% Glycemic control for   <7.0% adults with diabetes     CBG: Recent Labs  Lab 06/06/19 1631 06/06/19 2028 06/07/19 0729 06/07/19 1149 06/07/19 1616  GLUCAP 202* 78 200* 318* 330*    Review of Systems:   Unable to obtain due to encephalopathy  Past Medical History  He,  has a past medical history of Diabetes mellitus without complication ([place]), Hypertension, Pacemaker, and Stroke ([place]).   Surgical History    Past Surgical History:  Procedure Laterality Date  . CARDIAC SURGERY       Social  History   reports that he has been smoking. He has never used smokeless tobacco. He reports current alcohol use. He reports that he does not use drugs.   Family History   His Family history is unknown by patient.   Allergies Allergies  Allergen Reactions  . Amoxicillin Swelling and Rash    Did it involve swelling of  the face/tongue/throat, SOB, or low BP? No Did it involve sudden or severe rash/hives, skin peeling, or any reaction on the inside of your mouth or nose? No Did you need to seek medical attention at a hospital or doctor's office? Yes When did it last happen?<10 yrs If all above answers are "NO", may proceed with cephalosporin use.   Had to come to hospital after taking it.  . Codeine Nausea And Vomiting     Home Medications  Prior to Admission medications   Medication Sig Start Date End Date Taking? Authorizing Provider  acetaminophen (TYLENOL) 325 MG tablet Take 650 mg by mouth every 6 (six) hours as needed for moderate pain.    [provider]  allopurinol (ZYLOPRIM) 300 MG tablet Take 300 mg by mouth daily.    [provider]  clopidogrel (PLAVIX) 75 MG tablet Take 75 mg by mouth at bedtime.     [provider]  colchicine 0.6 MG tablet Take 0.6 mg by mouth See admin instructions. Take 1 tablet by mouth daily. If gout flare up take 2 tablets by mouth follow by 1 tablet twice daily for 2 days    [provider]  diphenhydrAMINE (BENADRYL) 25 MG tablet Take 25 mg by mouth at bedtime as needed for sleep.     [provider]  famotidine (PEPCID) 20 MG tablet Take 20 mg by mouth daily at 12 noon.    [provider]  insulin glargine (LANTUS) 100 UNIT/ML injection Inject 24 Units into the skin daily with breakfast.     [provider]  insulin lispro (HUMALOG) 100 UNIT/ML injection Inject 15 Units into the skin daily with lunch.     [provider]  isosorbide mononitrate (IMDUR) 30 MG 24 hr tablet  Take 30 mg by mouth daily.    [provider]  latanoprost (XALATAN) 0.005 % ophthalmic solution Place 1 drop into both eyes at bedtime.     [provider]  lisinopril (PRINIVIL,ZESTRIL) 5 MG tablet Take 5 mg by mouth daily with lunch.     [provider]  mycophenolate (CELLCEPT) 500 MG tablet Take 2 tablets (1,000 mg total) by mouth 2 (two) times daily. Restart on 06/14/2019 06/14/19   Aline August, MD  niacin (NIASPAN) 500 MG CR tablet Take 500 mg by mouth at bedtime.     [provider]  pravastatin (PRAVACHOL) 20 MG tablet Take 20 mg by mouth at bedtime.     [provider]  predniSONE (DELTASONE) 5 MG tablet Take 5 mg by mouth daily.    [provider]  propranolol (INDERAL) 10 MG tablet Take 10 mg by mouth 2 (two) times daily. Take at lunch and dinner    [provider]  tacrolimus (PROGRAF) 1 MG capsule Take 1 mg by mouth 2 (two) times daily.     [provider]  temazepam (RESTORIL) 30 MG capsule Take 30 mg by mouth at bedtime as needed for sleep.     [provider]     Critical care time:     The patient is critically ill with multiple organ system failure and requires high complexity decision making for assessment and support, frequent evaluation and titration of therapies, advanced monitoring, review of radiographic studies and interpretation of complex data.   Critical Care Time devoted to patient care services, exclusive of separately billable procedures, described in this note is 35 minutes.   Marshell Garfinkel MD [facility] Please see  [email or web address] for pager details.  06/07/2019, 7:10 PM

## 2019-06-07 NOTE — Progress Notes (Signed)
PT Cancellation Note  Patient Details Name: Brandon [name] MRN: [id number] DOB: 09-17-1940   Cancelled Treatment:    Reason Eval/Treat Not Completed: Medical issues which prohibited therapy(Spoke to RN who recommended holding due to pt withdrawal)   Ann Held PT, DPT Acute Rehab [place] P: [place] 06/07/2019, 1:25 PM

## 2019-06-08 ENCOUNTER — Inpatient Hospital Stay (HOSPITAL_COMMUNITY): Payer: Medicare Other

## 2019-06-08 LAB — BASIC METABOLIC PANEL
Anion gap: 16 — ABNORMAL HIGH (ref 5–15)
BUN: 62 mg/dL — ABNORMAL HIGH (ref 8–23)
CO2: 23 mmol/L (ref 22–32)
Calcium: 7.3 mg/dL — ABNORMAL LOW (ref 8.9–10.3)
Chloride: 107 mmol/L (ref 98–111)
Creatinine, Ser: 1.3 mg/dL — ABNORMAL HIGH (ref 0.61–1.24)
GFR calc Af Amer: >60 mL/min (ref >60)
GFR calc non Af Amer: 53 mL/min — ABNORMAL LOW (ref >60)
Glucose, Bld: 197 mg/dL — ABNORMAL HIGH (ref 70–99)
Potassium: 3.4 mmol/L — ABNORMAL LOW (ref 3.5–5.1)
Sodium: 146 mmol/L — ABNORMAL HIGH (ref 135–145)

## 2019-06-08 LAB — GLUCOSE, CAPILLARY
Glucose-Capillary: 222 mg/dL — ABNORMAL HIGH (ref 70–99)
Glucose-Capillary: 226 mg/dL — ABNORMAL HIGH (ref 70–99)
Glucose-Capillary: 244 mg/dL — ABNORMAL HIGH (ref 70–99)
Glucose-Capillary: 201 mg/dL — ABNORMAL HIGH (ref 70–99)

## 2019-06-08 LAB — CULTURE, BLOOD (ROUTINE X 2): Special Requests: ADEQUATE

## 2019-06-08 LAB — C-REACTIVE PROTEIN: CRP: 2.4 mg/dL — ABNORMAL HIGH (ref <1.0)

## 2019-06-08 LAB — FERRITIN: Ferritin: 622 ng/mL — ABNORMAL HIGH (ref 24–336)

## 2019-06-08 LAB — D-DIMER, QUANTITATIVE: D-Dimer, Quant: 5.2 ug/mL-FEU — ABNORMAL HIGH (ref 0.00–0.50)

## 2019-06-08 MED ORDER — INSULIN DETEMIR 100 UNIT/ML ~~LOC~~ SOLN
35.0000 [IU] | Freq: Every day | SUBCUTANEOUS | Status: DC
  Administered 2019-06-09 – 2019-06-11 (×2): 35 [IU] via SUBCUTANEOUS
  Filled 2019-06-08 (×4): qty 0.35

## 2019-06-08 MED ORDER — ENOXAPARIN SODIUM 100 MG/ML ~~LOC~~ SOLN
1.0000 mg/kg | Freq: Two times a day (BID) | SUBCUTANEOUS | Status: DC
  Administered 2019-06-08 – 2019-06-11 (×7): 90 mg via SUBCUTANEOUS
  Filled 2019-06-08 (×9): qty 1

## 2019-06-08 MED ORDER — PHENOBARBITAL SODIUM 130 MG/ML IJ SOLN
130.0000 mg | Freq: Once | INTRAMUSCULAR | Status: AC
  Administered 2019-06-08: 130 mg via INTRAVENOUS
  Filled 2019-06-08: qty 1

## 2019-06-08 MED ORDER — KCL IN DEXTROSE-NACL 40-5-0.45 MEQ/L-%-% IV SOLN
INTRAVENOUS | Status: DC
  Filled 2019-06-08 (×8): qty 1000

## 2019-06-08 MED ORDER — POTASSIUM CHLORIDE 20 MEQ/15ML (10%) PO SOLN: 40.0000 meq | Freq: Once | ORAL | Status: DC

## 2019-06-08 NOTE — Progress Notes (Addendum)
   NAME:  Brandon [name], MRN:  [id number], DOB:  [birth year], LOS: 4 ADMISSION DATE:  06/23/2019, CONSULTATION DATE:  06/06/2018 REFERRING MD:  Eric [place] MD, CHIEF COMPLAINT: Encephalopathy, EtOH withdrawal  Brief History   79 year old with history of cardiac transplant, diabetes, hypertension, pacemaker, CVA, chronic kidney disease admitted with COVID-19 infection on 2/1.  Hospital course complicated by sepsis, lactic acidosis, staph, strep bacteremia.  Treated with remdesivir, Solu-Medrol and antibiotics  Transferred to ICU on 2/5 for increasing encephalopathy, agitation, EtOH withdrawal requiring multiple doses of Ativan. PCCM consulted for Precedex Patient drinks 1.5 bottles of wine daily  Past Medical History    has a past medical history of Diabetes mellitus without complication ([place]), Hypertension, Pacemaker, and Stroke ([place]).  CKD, cardiac transplant   Significant Hospital Events   2/1-admit 2/4-transfer to ICU for Precedex  Consults:  PCCM  Procedures:    Significant Diagnostic Tests:  CT head 2/2-no acute abnormality, chronic stroke, ischemic microangiopathy  Micro Data:  Urine culture 1/29-E. coli Blood cultures 2/1-Streptococcus Blood cultures 2/2-staph epidermidis  Antimicrobials:  Ceftriaxone Remdesivir Solu-Medrol  Interim history/subjective:  No acute events.  Started on Precedex overnight Obtunded after receiving Ativan today morning  Objective   Blood pressure (!) 146/90, pulse 69, temperature 97.6 F (36.4 C), temperature source Axillary, resp. rate 14, height 5\' 9"  (1.753 m), weight 90.5 kg, SpO2 95 %.    FiO2 (%):  [100 %] 100 %   Intake/Output Summary (Last 24 hours) at 06/08/2019 0751 Last data filed at 06/08/2019 0700 Gross per 24 hour  Intake 1916.14 ml  Output 1350 ml  Net 566.14 ml   Filed Weights   06/04/19 0500 06/06/19 0500 06/08/19 0500  Weight: 85.3 kg 88.3 kg 90.5 kg    Examination: Gen:      No acute distress HEENT:  EOMI,  sclera anicteric Neck:     No masses; no thyromegaly Lungs:    Clear to auscultation bilaterally; normal respiratory effort CV:         Regular rate and rhythm; no murmurs Abd:      + bowel sounds; soft, non-tender; no palpable masses, no distension Ext:    No edema; adequate peripheral perfusion Skin:      Warm and dry; no rash Neuro: Sedated, unresponsive.  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  We will stop standing benzos and give phenobarb Wean off Precedex drip as tolerated  Staph bacteremia Continue antibiotics  Heart transplant Continue CellCept, Prograf  Best practice:  Diet: NPO Pain/Anxiety/Delirium protocol (if indicated): NA VAP protocol (if indicated): NA DVT prophylaxis: Lovenox GI prophylaxis: NA Glucose control: SSI, insulin Mobility: Bed Code Status: Full Family Communication: per primary Disposition: ICU  Critical care time:    The patient is critically ill with multiple organ system failure and requires high complexity decision making for assessment and support, frequent evaluation and titration of therapies, advanced monitoring, review of radiographic studies and interpretation of complex data.   Critical Care Time devoted to patient care services, exclusive of separately billable procedures, described in this note is 40 minutes.   Marshell Garfinkel MD [facility] Please see [email or web address] for pager details.  06/08/2019, 7:56 AM

## 2019-06-08 NOTE — Progress Notes (Signed)
ANTICOAGULATION CONSULT NOTE - Initial Consult  Pharmacy Consult for Lovenox Indication: R/o VTE   Allergies  Allergen Reactions  . Amoxicillin Swelling and Rash    Did it involve swelling of the face/tongue/throat, SOB, or low BP? No Did it involve sudden or severe rash/hives, skin peeling, or any reaction on the inside of your mouth or nose? No Did you need to seek medical attention at a hospital or doctor's office? Yes When did it last happen?<10 yrs If all above answers are "NO", may proceed with cephalosporin use.   Had to come to hospital after taking it.  . Codeine Nausea And Vomiting    Patient Measurements: Height: 5\' 9"  (175.3 cm) Weight: 199 lb 8.3 oz (90.5 kg) IBW/kg (Calculated) : 70.7  Vital Signs: Temp: 97.7 F (36.5 C) (02/06 0800) Temp Source: Rectal (02/06 0800) BP: 159/102 (02/06 0800) Pulse Rate: 81 (02/06 0800)  Labs: Recent Labs    06/06/19 0225 06/07/19 0137 06/08/19 0429  HGB 14.3 12.8*  --   HCT 42.9 38.5*  --   PLT 234 180  --   CREATININE 1.29* 1.44* 1.30*    Estimated Creatinine Clearance: 52.9 mL/min (A) (by C-G formula based on SCr of 1.3 mg/dL (H)).   Medical History: Past Medical History:  Diagnosis Date  . Diabetes mellitus without complication ([place])   . Hypertension   . Pacemaker   . Stroke [facility])     Medications:  Medications Prior to Admission  Medication Sig Dispense Refill Last Dose  . acetaminophen (TYLENOL) 325 MG tablet Take 650 mg by mouth every 6 (six) hours as needed for moderate pain.     Marland Kitchen allopurinol (ZYLOPRIM) 300 MG tablet Take 300 mg by mouth daily.     . clopidogrel (PLAVIX) 75 MG tablet Take 75 mg by mouth at bedtime.      . colchicine 0.6 MG tablet Take 0.6 mg by mouth See admin instructions. Take 1 tablet by mouth daily. If gout flare up take 2 tablets by mouth follow by 1 tablet twice daily for 2 days     . diphenhydrAMINE (BENADRYL) 25 MG tablet Take 25 mg by mouth at bedtime as needed for  sleep.      . famotidine (PEPCID) 20 MG tablet Take 20 mg by mouth daily at 12 noon.     . insulin glargine (LANTUS) 100 UNIT/ML injection Inject 24 Units into the skin daily with breakfast.      . insulin lispro (HUMALOG) 100 UNIT/ML injection Inject 15 Units into the skin daily with lunch.      . isosorbide mononitrate (IMDUR) 30 MG 24 hr tablet Take 30 mg by mouth daily.     Marland Kitchen latanoprost (XALATAN) 0.005 % ophthalmic solution Place 1 drop into both eyes at bedtime.      Marland Kitchen lisinopril (PRINIVIL,ZESTRIL) 5 MG tablet Take 5 mg by mouth daily with lunch.      Derrill Memo ON 06/14/2019] mycophenolate (CELLCEPT) 500 MG tablet Take 2 tablets (1,000 mg total) by mouth 2 (two) times daily. Restart on 06/14/2019     . niacin (NIASPAN) 500 MG CR tablet Take 500 mg by mouth at bedtime.      . pravastatin (PRAVACHOL) 20 MG tablet Take 20 mg by mouth at bedtime.      . predniSONE (DELTASONE) 5 MG tablet Take 5 mg by mouth daily.     . propranolol (INDERAL) 10 MG tablet Take 10 mg by mouth 2 (two) times daily. Take at lunch  and dinner     . tacrolimus (PROGRAF) 1 MG capsule Take 1 mg by mouth 2 (two) times daily.      . temazepam (RESTORIL) 30 MG capsule Take 30 mg by mouth at bedtime as needed for sleep.        Assessment: 72 YOM with COVID-19 pneumonia now with rising D-dimer requiring 15L HFNC. Pharmacy consulted to increase lovenox to treatment dose.   SCr 1.3, H/H low, Plt wnl.   Goal of Therapy:  Anti-Xa level 0.6-1 units/ml 4hrs after LMWH dose given Monitor platelets by anticoagulation protocol: Yes   Plan:  -Increase Lovenox to 90 mg BID  -Monitor CBC, renal fx and s/s of bleeding -LE dopplers?  Albertina Parr, PharmD., BCPS Clinical Pharmacist Clinical phone for 06/08/19 until 5pm: [phone]

## 2019-06-08 NOTE — Progress Notes (Signed)
Call from pt's wife. Updated on pt condition and plan of care. Questions answered.

## 2019-06-08 NOTE — Progress Notes (Signed)
PROGRESS NOTE    Brandon [name]  [id number] DOB: [birth year] DOA: 06/23/2019 PCP: Leanna Battles, MD    Brief Narrative:   Brandon [name] is a 79 y.o. male with history of cardiac transplant, diabetes mellitus type 2, hypertension, pacemaker, CVA, chronic kidney disease stage III was recently admitted after a fall at that time patient was diagnosed with COVID-19 infection was discharged home after physical therapy on June 01, 2019.  For the last 2 days per patient's wife patient has not been doing well not eating well and had some diarrhea and was increasingly confused.  Patient blood sugar was running low and EMS was called.  After patient was given some food blood sugar improved but since patient was getting more weak he was brought to the ER.  In the ER patient was hypotensive with lactic acid of 4.6 CRP 22.6 creatinine worsened to 1.9 from 1.6 sodium 134 procalcitonin 0.19 CBC largely unremarkable except for hemoglobin 17.1 D-dimer 1.83 INR 1.1 UA chest x-ray unremarkable.  CT head unremarkable. Patient is oriented to his name and place after patient was started on fluids and empiric antibiotics for sepsis.  Prior to that patient was confused.  Patient admitted for sepsis with Covid infection.  EKG shows sinus tachycardia. Patient's blood pressure improved with fluids and stress dose steroids.   Assessment & Plan:   Principal Problem:   COVID-19 Active Problems:   Sepsis (Brandon [name])   Heart transplanted (Brandon [name])   CAD (coronary artery disease)   Essential hypertension   Type 2 diabetes mellitus with stage 3 chronic kidney disease (HCC)   Encephalopathy   Acute kidney injury superimposed on chronic kidney disease (Brandon [name])   Positive blood culture   Acute metabolic encephalopathy Etiology likely multifactorial given staph aureus septicemia combined with hypoxic respiratory failure secondary to Covid-19 viral pneumonia.  CT head with chronic ischemic microangiopathy and old right  frontal lobe infarct without acute intracranial abnormality.  Continue treatment with ceftriaxone for septicemia and remdesivir/steroids for Covid-pneumonia as below.  Sepsis, present on admission Lactic acidosis Streptococcus mitis/oralis septicemia Blood cultures 2 out of 4 positive for GPC's.  Complicated by history of pacemaker placement.  Discussed with infectious disease, Dr. Megan Salon on 06/05/2019, and believes he could have a true bacteremia with methicillin sensitive coagulase-negative Staphylococcus. TTE 2/3 with EF [id number], grade 1 diastolic dysfunction, LA mildly dilated, normal IVC, valves not well seen but no obvious vegetations. --ID now signed off 06/07/2019 --Lactic acid 4.6-->1.6 --Continue ceftriaxone 2 g IV every 24 hours; plan 7 day course per ID; End: 06/10/2019 --Repeat blood cultures 06/05/2019 negative to date  Acute hypoxic respiratory failure secondary to acute Covid-19 viral pneumonia during the ongoing 2020 Covid 19 Pandemic - POA Diagnosed 05/31/2019.  Patient presents with progressive dyspnea, found to be hypoxic on room air.  0.83, fibrinogen 750, CRP 22.6.  Chest x-ray with progressive bibasilar interstitial and groundglass opacities consistent with multifocal pneumonia. --ddimer 1.83-->3.60-->1.65-->1.88-->1.32-->5.20 --CRP 22.6-->16.1-->12.4-->7.5-->3.9-->2.4 --Continue remdesivir, plan 5-day course; day #5/5 --Continue Solu-Medrol 60 mg every 12 hours --Continue supplemental oxygen, titrate to maintain SPO2 greater than 95%; currently on 15 L heated high flow nasal cannula and 15L NRB --Continue supportive care with vitamin C, zinc, Tylenol, Dulera MDI --Follow CBC, CMP, D-dimer, ferritin, and CRP daily --Continue airborne/contact isolation precautions  EtOH abuse/withdrawl Patient endorses 1.5 bottles of wine daily.  Patient initially started to be tremulous which progressed to agitation and combativeness. --CIWAA protocol w/ ativan and Precedex gtt --per PCCM; to  give phenobarbital today --  Continue nonviolent restraints with mittens/soft wrist restraints as he has been pulling at medical equipment; IV lines and pulling off oxygen devices mask/nasal cannula --Folic acid, thiamine, multivitamin  Elevated D-dimer D-dimer past 24 hours increased from 1.32 to 5.20. --Start treatment dose Lovenox --Check vascular duplex ultrasound bilateral lower extremities --Follow D-dimer daily  History of cardiac transplant Patient on mycophenolate 1000 mg p.o. twice daily, prednisone 5 mg p.o. daily, tacrolimus 1 mg p.o. twice daily at home. Follows with [facility] cardiology/transplant team. --Continue tacrolimus 1 mg p.o. BID --On high-dose steroids as above for Covid-19 pneumonia --Holding mycophenolate per transplant team, plan restart on 06/14/2019  Type 2 diabetes mellitus Hemoglobin A1c 9.0, not optimally controlled.  On Lantus 24 units subcutaneously daily, Humalog 15 units subcutaneously daily with lunch at home. --Increase Levemir from 25u to 35u [place] daily --Insulin sliding scale for further coverage  Acute on chronic kidney disease stage III Baseline creatinine 1.6, creatinine 2.0 on admission --Cr 2.02-->1.29-->1.44-->1.30 --Continue IVF w/ D5 half-normal saline at 75 mL/hr --Continue to hold home lisinopril --Avoid nephrotoxins, renally dose all medications --Monitor renal function daily  Essential hypertension On lisinopril 5 mg p.o. daily, propranolol 10 mg p.o. twice daily at home.  Patient was hypotensive on presentation. --BP 146/90 today --Continue propranolol 10 mg p.o. twice daily  --Continue to hold home lisinopril for now --Monitor blood pressure closely  Weakness, debility: --OT currently recommends SNF with PT recommending home health, given his current mental status, will continue therapy while inpatient and await final recommendations when more ready for discharge.   DVT prophylaxis: Heparin Code Status: Full code Family  Communication: Update patient's spouse, Brandon [name] via telephone this morning Disposition Plan: From home with spouse, to be determined, continues require IV antibiotics per ID to complete 7-day course, continues on high FiO2 with heated high flow nasal cannula and NRB, also with active EtOH withdrawals, unsafe for discharge at this time   Consultants:   ID -  Dr. Megan Salon - signed off 06/07/2019  PCCM - Dr. Vaughan Browner  Procedures:   TTE 06/05/2019 IMPRESSIONS    1. Left ventricular ejection fraction, by visual estimation, is 65 to  70%. The left ventricle has hyperdynamic function. There is no left  ventricular hypertrophy.  2. Left ventricular diastolic parameters are consistent with Grade I  diastolic dysfunction (impaired relaxation).  3. The left ventricle has no regional wall motion abnormalities.  4. Global right ventricle has normal systolic function.The right  ventricular size is normal. No increase in right ventricular wall  thickness.  5. Left atrial size was mildly dilated.  6. Right atrial size was normal.  7. The mitral valve is normal in structure. No evidence of mitral valve  regurgitation.  8. The tricuspid valve is grossly normal.  9. The tricuspid valve is grossly normal. Tricuspid valve regurgitation  is not demonstrated.  10. The aortic valve is grossly normal. Aortic valve regurgitation is not  visualized.  11. The pulmonic valve was grossly normal. Pulmonic valve regurgitation is  not visualized.  12. The inferior vena cava is normal in size with greater than 50%  respiratory variability, suggesting right atrial pressure of 3 mmHg.  13. The interatrial septum was not wll visualized.  14. Interpretation limited due to poor sound wave transmission. LVEF is  normal. LV appears underfilled. Valves not seen well but no obvious  vegetations noted.limited due to poor sound wave trans,mis   COVID Treatment:   Remdesivir   solumedrol   Subjective:  Patient  seen  and examined bedside; nonresponsive to verbal stimuli.  Currently on Precedex drip, recently given IV Ativan for further combativeness.  Continues with high FiO2 requirements on 15 L high flow nasal cannula.  Inflammatory markers improving, although D-dimer increased from 1.32-5.20 today.  PCCM giving phenobarbital for further assistance with withdrawal symptoms.  Unable to obtain any further ROS from patient given his current mental status. No other acute events overnight per nursing staff.  Objective: Vitals:   06/08/19 0800 06/08/19 0825 06/08/19 0900 06/08/19 1000  BP: (!) 159/102 (!) 158/97 (!) 148/90 (!) 152/89  Pulse: 81 82 73 70  Resp: (!) 25 20 16 15   Temp: 97.7 F (36.5 C)     TempSrc: Rectal     SpO2: (!) 88% 90% 94% 100%  Weight:      Height:        Intake/Output Summary (Last 24 hours) at 06/08/2019 1102 Last data filed at 06/08/2019 0800 Gross per 24 hour  Intake 1751.8 ml  Output 1490 ml  Net 261.8 ml   Filed Weights   06/04/19 0500 06/06/19 0500 06/08/19 0500  Weight: 85.3 kg 88.3 kg 90.5 kg    Examination:  General exam: Unresponsive to verbal stimuli, obtunded Respiratory system: Clear to auscultation. Respiratory effort normal.  On 15 L heated high flow nasal cannula with 15 L NRB with SPO2 95% Cardiovascular system: S1 & S2 heard, RRR. No JVD, murmurs, rubs, gallops or clicks. No pedal edema. Gastrointestinal system: Abdomen is nondistended, soft and nontender. No organomegaly or masses felt. Normal bowel sounds heard. Central nervous system:  obtunded; withdraws to painful stimuli Extremities: Moves all extremities independently Skin: No rashes, lesions or ulcers Psychiatry: Unable to assess secondary to current mental status.    Data Reviewed: I have personally reviewed following labs and imaging studies  CBC: Recent Labs  Lab 06/20/2019 2257 06/04/19 0444 06/05/19 0205 06/06/19 0225 06/07/19 0137  WBC 6.7 4.5 5.0 8.8 5.7  NEUTROABS 5.6 3.7  --    --   --   HGB 17.1* 13.2 13.5 14.3 12.8*  HCT 52.2* 39.2 40.5 42.9 38.5*  MCV 93.2 92.9 91.8 91.5 93.7  PLT 162 107* 134* 234 [id number]   Basic Metabolic Panel: Recent Labs  Lab 06/22/2019 2257 06/04/19 0444 06/06/19 0225 06/07/19 0137 06/08/19 0429  NA 134* 135 142 144 146*  K 4.2 4.1 3.3* 3.6 3.4*  CL 98 104 102 109 107  CO2 22 18* 25 26 23   GLUCOSE 88 200* 113* 108* 197*  BUN 44* 44* 55* 60* 62*  CREATININE 1.94* 2.02* 1.29* 1.44* 1.30*  CALCIUM 9.3 7.6* 8.4* 8.0* 7.3*   GFR: Estimated Creatinine Clearance: 52.9 mL/min (A) (by C-G formula based on SCr of 1.3 mg/dL (H)). Liver Function Tests: Recent Labs  Lab 06/15/2019 2257 06/04/19 0444  AST 27 19  ALT 23 19  ALKPHOS 66 48  BILITOT 1.4* 0.7  PROT 7.6 5.6*  ALBUMIN 4.2 3.0*   No results for input(s): LIPASE, AMYLASE in the last 168 hours. No results for input(s): AMMONIA in the last 168 hours. Coagulation Profile: Recent Labs  Lab 06/29/2019 2257  INR 1.1   Cardiac Enzymes: No results for input(s): CKTOTAL, CKMB, CKMBINDEX, TROPONINI in the last 168 hours. BNP (last 3 results) No results for input(s): PROBNP in the last 8760 hours. HbA1C: No results for input(s): HGBA1C in the last 72 hours. CBG: Recent Labs  Lab 06/07/19 0729 06/07/19 1149 06/07/19 1616 06/07/19 1952 06/08/19 0741  GLUCAP 200* 318* 330*  213* 222*   Lipid Profile: No results for input(s): CHOL, HDL, LDLCALC, TRIG, CHOLHDL, LDLDIRECT in the last 72 hours. Thyroid Function Tests: No results for input(s): TSH, T4TOTAL, FREET4, T3FREE, THYROIDAB in the last 72 hours. Anemia Panel: Recent Labs    06/07/19 0137 06/08/19 0429  FERRITIN 760* 622*   Sepsis Labs: Recent Labs  Lab 06/08/2019 2257 06/07/2019 2257 06/04/19 0110 06/04/19 0444 06/04/19 0706 06/04/19 1155  PROCALCITON 0.19  --   --  0.24  --  0.24  LATICACIDVEN 4.6*   < > 2.7* 1.7 2.6* 1.6   < > = values in this interval not displayed.    Recent Results (from the past 240  hour(s))  Blood Culture (routine x 2)     Status: None   Collection Time: 05/30/19 11:45 PM   Specimen: BLOOD  Result Value Ref Range Status   Specimen Description BLOOD LEFT ARM  Final   Special Requests   Final    BOTTLES DRAWN AEROBIC AND ANAEROBIC Blood Culture adequate volume   Culture   Final    NO GROWTH 5 DAYS Performed at [facility], [address], [place], [place] [address]    Report Status 06/05/2019 FINAL  Final  Blood Culture (routine x 2)     Status: None   Collection Time: 05/30/19 11:50 PM   Specimen: BLOOD  Result Value Ref Range Status   Specimen Description BLOOD LEFT HAND  Final   Special Requests   Final    BOTTLES DRAWN AEROBIC ONLY Blood Culture adequate volume   Culture   Final    NO GROWTH 5 DAYS Performed at [facility], [place] [address] [place], [place] [address]    Report Status 06/05/2019 FINAL  Final  Urine culture     Status: Abnormal   Collection Time: 05/31/19 12:37 AM   Specimen: In/Out Cath Urine  Result Value Ref Range Status   Specimen Description IN/OUT CATH URINE  Final   Special Requests   Final    NONE Performed at [facility], [place] [address], [place], [place] [address]    Culture 20,000 COLONIES/mL ESCHERICHIA COLI (A)  Final   Report Status 06/01/2019 FINAL  Final   Organism ID, Bacteria ESCHERICHIA COLI (A)  Final      Susceptibility   Escherichia coli - MIC*    AMPICILLIN <=2 SENSITIVE Sensitive     CEFAZOLIN <=4 SENSITIVE Sensitive     CEFTRIAXONE <=0.25 SENSITIVE Sensitive     CIPROFLOXACIN <=0.25 SENSITIVE Sensitive     GENTAMICIN <=1 SENSITIVE Sensitive     IMIPENEM <=0.25 SENSITIVE Sensitive     NITROFURANTOIN <=16 SENSITIVE Sensitive     TRIMETH/SULFA <=20 SENSITIVE Sensitive     AMPICILLIN/SULBACTAM <=2 SENSITIVE Sensitive     PIP/TAZO <=4 SENSITIVE Sensitive     * 20,000 COLONIES/mL ESCHERICHIA COLI  Culture, blood (Routine x 2)     Status: Abnormal   Collection Time: 06/07/2019 10:57 PM    Specimen: BLOOD  Result Value Ref Range Status   Specimen Description BLOOD LEFT ARM  Final   Special Requests   Final    BOTTLES DRAWN AEROBIC AND ANAEROBIC Blood Culture adequate volume   Culture  Setup Time   Final    GRAM POSITIVE COCCI IN PAIRS IN BOTH AEROBIC AND ANAEROBIC BOTTLES CRITICAL RESULT CALLED TO, READ BACK BY AND VERIFIED WITH: [place] [id number] 06/04/19 A BROWNING Performed at [facility], [place]  [address] [place], [place] [address]    Culture STREPTOCOCCUS MITIS/ORALIS (A)  Final   Report Status 06/06/2019 FINAL  Final   Organism ID, Bacteria STREPTOCOCCUS MITIS/ORALIS  Final      Susceptibility   Streptococcus mitis/oralis - MIC*    TETRACYCLINE 4 SENSITIVE Sensitive     VANCOMYCIN 0.5 SENSITIVE Sensitive     CLINDAMYCIN >=1 RESISTANT Resistant     PENICILLIN Value in next row Sensitive      SENSITIVE<=0.06    CEFTRIAXONE Value in next row Sensitive      SENSITIVE<=0.12    * STREPTOCOCCUS MITIS/ORALIS  Urine culture     Status: Abnormal   Collection Time: 06/22/2019 11:38 PM   Specimen: Urine, Clean Catch  Result Value Ref Range Status   Specimen Description URINE, CLEAN CATCH  Final   Special Requests   Final    NONE Performed at [facility], [place] [address] [place], [place] [address]    Culture MULTIPLE SPECIES PRESENT, SUGGEST RECOLLECTION (A)  Final   Report Status 06/04/2019 FINAL  Final  Culture, blood (Routine x 2)     Status: Abnormal   Collection Time: 06/04/19  1:10 AM   Specimen: BLOOD RIGHT FOREARM  Result Value Ref Range Status   Specimen Description BLOOD RIGHT FOREARM  Final   Special Requests   Final    BOTTLES DRAWN AEROBIC AND ANAEROBIC Blood Culture adequate volume   Culture  Setup Time   Final    ANAEROBIC BOTTLE ONLY GRAM POSITIVE COCCI CRITICAL RESULT CALLED TO, READ BACK BY AND VERIFIED WITH: Deborra [facility] 06/04/19 2254 JDW Performed at [facility], [place] [address], [place], [place] [address]    Culture  STAPHYLOCOCCUS EPIDERMIDIS (A)  Final   Report Status 06/06/2019 FINAL  Final   Organism ID, Bacteria STAPHYLOCOCCUS EPIDERMIDIS  Final      Susceptibility   Staphylococcus epidermidis - MIC*    CIPROFLOXACIN <=0.5 SENSITIVE Sensitive     ERYTHROMYCIN <=0.25 SENSITIVE Sensitive     GENTAMICIN <=0.5 SENSITIVE Sensitive     OXACILLIN <=0.25 SENSITIVE Sensitive     TETRACYCLINE >=16 RESISTANT Resistant     VANCOMYCIN 1 SENSITIVE Sensitive     TRIMETH/SULFA <=10 SENSITIVE Sensitive     CLINDAMYCIN <=0.25 SENSITIVE Sensitive     RIFAMPIN <=0.5 SENSITIVE Sensitive     Inducible Clindamycin NEGATIVE Sensitive     * STAPHYLOCOCCUS EPIDERMIDIS  Blood Culture ID Panel (Reflexed)     Status: Abnormal   Collection Time: 06/04/19  1:10 AM  Result Value Ref Range Status   Enterococcus species NOT DETECTED NOT DETECTED Final   Listeria monocytogenes NOT DETECTED NOT DETECTED Final   Staphylococcus species DETECTED (A) NOT DETECTED Final    Comment: Methicillin (oxacillin) susceptible coagulase negative staphylococcus. Possible blood culture contaminant (unless isolated from more than one blood culture draw or clinical case suggests pathogenicity). No antibiotic treatment is indicated for blood  culture contaminants. CRITICAL RESULT CALLED TO, READ BACK BY AND VERIFIED WITH: T MEYER PHARMD 06/04/19 2254 JDW    Staphylococcus aureus (BCID) NOT DETECTED NOT DETECTED Final   Methicillin resistance NOT DETECTED NOT DETECTED Final   Streptococcus species NOT DETECTED NOT DETECTED Final   Streptococcus agalactiae NOT DETECTED NOT DETECTED Final   Streptococcus pneumoniae NOT DETECTED NOT DETECTED Final   Streptococcus pyogenes NOT DETECTED NOT DETECTED Final   Acinetobacter baumannii NOT DETECTED NOT DETECTED Final   Enterobacteriaceae species NOT DETECTED NOT DETECTED Final   Enterobacter cloacae  complex NOT DETECTED NOT DETECTED Final   Escherichia coli NOT DETECTED NOT DETECTED Final   Klebsiella  oxytoca NOT DETECTED NOT DETECTED Final   Klebsiella pneumoniae NOT DETECTED NOT DETECTED Final   Proteus species NOT DETECTED NOT DETECTED Final   Serratia marcescens NOT DETECTED NOT DETECTED Final   Haemophilus influenzae NOT DETECTED NOT DETECTED Final   Neisseria meningitidis NOT DETECTED NOT DETECTED Final   Pseudomonas aeruginosa NOT DETECTED NOT DETECTED Final   Candida albicans NOT DETECTED NOT DETECTED Final   Candida glabrata NOT DETECTED NOT DETECTED Final   Candida krusei NOT DETECTED NOT DETECTED Final   Candida parapsilosis NOT DETECTED NOT DETECTED Final   Candida tropicalis NOT DETECTED NOT DETECTED Final    Comment: Performed at [facility], [place] [address], [place], [place] [address]  MRSA PCR Screening     Status: None   Collection Time: 06/04/19  8:35 AM   Specimen: Nasopharyngeal  Result Value Ref Range Status   MRSA by PCR NEGATIVE NEGATIVE Final    Comment:        The GeneXpert MRSA Assay (FDA approved for NASAL specimens only), is one component of a comprehensive MRSA colonization surveillance program. It is not intended to diagnose MRSA infection nor to guide or monitor treatment for MRSA infections. Performed at [facility], [place] [address], [place], [place] [address]   Culture, blood (routine x 2)     Status: None (Preliminary result)   Collection Time: 06/05/19  3:00 PM   Specimen: BLOOD  Result Value Ref Range Status   Specimen Description   Final    BLOOD LEFT HAND Performed at [place] [address] [place], [place] [address]    Special Requests   Final    BOTTLES DRAWN AEROBIC ONLY Blood Culture results may not be optimal due to an inadequate volume of blood received in culture bottles Performed at [place] [address], [place], [place] [address]    Culture   Final    NO GROWTH 3 DAYS Performed at [facility], [place] [address], [place], [place] [address]    Report  Status PENDING  Incomplete  Culture, blood (routine x 2)     Status: None (Preliminary result)   Collection Time: 06/05/19  3:06 PM   Specimen: BLOOD  Result Value Ref Range Status   Specimen Description   Final    BLOOD RIGHT HAND Performed at [place] [address], [place], [place] [address]    Special Requests   Final    BOTTLES DRAWN AEROBIC ONLY Blood Culture adequate volume Performed at [place] [address] [place], [place] [address]    Culture   Final    NO GROWTH 3 DAYS Performed at [facility], [place] [address], [place],  [address]    Report Status PENDING  Incomplete  MRSA PCR Screening     Status: None   Collection Time: 06/07/19  6:48 PM   Specimen: Nasal Mucosa; Nasopharyngeal  Result Value Ref Range Status   MRSA by PCR NEGATIVE NEGATIVE Final    Comment:        The GeneXpert MRSA Assay (FDA approved for NASAL specimens only), is one component of a comprehensive MRSA colonization surveillance program. It is not intended to diagnose MRSA infection nor to guide or monitor treatment for MRSA infections. Performed at [facility], [place] [address]  [place], [place] [address]          Radiology Studies: No results found.      Scheduled Meds: . allopurinol  300 mg Oral Daily  . vitamin C  500 mg Oral Daily  . chlorhexidine  15 mL Mouth Rinse BID  . Chlorhexidine Gluconate Cloth  6 each Topical Daily  . clopidogrel  75 mg Oral QHS  . enoxaparin (LOVENOX) injection  1 mg/kg Subcutaneous Q12H  . folic acid  1 mg Oral Daily  . insulin aspart  0-15 Units Subcutaneous TID WC  . insulin aspart  0-5 Units Subcutaneous QHS  . insulin detemir  25 Units Subcutaneous Daily  . Ipratropium-Albuterol  1 puff Inhalation Q6H  . latanoprost  1 drop Both Eyes QHS  . mouth rinse  15 mL Mouth Rinse q12n4p  . methylPREDNISolone (SOLU-MEDROL) injection  60 mg Intravenous Q12H  .  mometasone-formoterol  2 puff Inhalation BID  . multivitamin with minerals  1 tablet Oral Daily  . PHENObarbital  130 mg Intravenous Once  . pravastatin  20 mg Oral QHS  . propranolol  10 mg Oral BID AC  . tacrolimus  1 mg Oral BID  . thiamine  100 mg Oral Daily   Or  . thiamine  100 mg Intravenous Daily  . zinc sulfate  220 mg Oral Daily   Continuous Infusions: . cefTRIAXone (ROCEPHIN)  IV Stopped (06/07/19 1720)  . dexmedetomidine (PRECEDEX) IV infusion 0.5 mcg/kg/hr (06/08/19 0800)  . dextrose 5 % and 0.45% NaCl 75 mL/hr at 06/08/19 0800  . remdesivir 100 mg in NS 100 mL 100 mg (06/08/19 1033)     LOS: 4 days    Critical Care Time Upon my evaluation, this patient had a high probability of imminent or life-threatening deterioration due to severe hypoxic respiratory failure secondary to Covid-19 viral pneumonia, EtOH withdrawals, which required my direct attention, intervention, and personal management.  I have personally provided 42 minutes of critical care time exclusive of my time spent on separately billable procedures.  Time includes review of laboratory data, radiology results, discussion with consultants, and monitoring for potential decompensation.       Tab Rylee J [place] DO [facility] Available via Epic secure chat 7am-7pm After these hours, please refer to coverage provider listed on [email or web address] 06/08/2019, 11:02 AM

## 2019-06-09 ENCOUNTER — Inpatient Hospital Stay (HOSPITAL_COMMUNITY): Payer: Medicare Other

## 2019-06-09 DIAGNOSIS — R7989 Other specified abnormal findings of blood chemistry: Secondary | ICD-10-CM

## 2019-06-09 LAB — CBC
HCT: 41.5 % (ref 39.0–52.0)
Hemoglobin: 13.4 g/dL (ref 13.0–17.0)
MCH: 30.2 pg (ref 26.0–34.0)
MCHC: 32.3 g/dL (ref 30.0–36.0)
MCV: 93.5 fL (ref 80.0–100.0)
Platelets: 150 10*3/uL (ref 150–400)
RBC: 4.44 MIL/uL (ref 4.22–5.81)
RDW: 12.5 % (ref 11.5–15.5)
WBC: 4.7 10*3/uL (ref 4.0–10.5)
nRBC: 0.4 % — ABNORMAL HIGH (ref 0.0–0.2)

## 2019-06-09 LAB — COMPREHENSIVE METABOLIC PANEL
ALT: 23 U/L (ref 0–44)
AST: 20 U/L (ref 15–41)
Albumin: 2.8 g/dL — ABNORMAL LOW (ref 3.5–5.0)
Alkaline Phosphatase: 85 U/L (ref 38–126)
Anion gap: 11 (ref 5–15)
BUN: 42 mg/dL — ABNORMAL HIGH (ref 8–23)
CO2: 28 mmol/L (ref 22–32)
Calcium: 6.9 mg/dL — ABNORMAL LOW (ref 8.9–10.3)
Chloride: 109 mmol/L (ref 98–111)
Creatinine, Ser: 0.8 mg/dL (ref 0.61–1.24)
GFR calc Af Amer: >60 mL/min (ref >60)
GFR calc non Af Amer: >60 mL/min (ref >60)
Glucose, Bld: 177 mg/dL — ABNORMAL HIGH (ref 70–99)
Potassium: 3.2 mmol/L — ABNORMAL LOW (ref 3.5–5.1)
Sodium: 148 mmol/L — ABNORMAL HIGH (ref 135–145)
Total Bilirubin: 0.6 mg/dL (ref 0.3–1.2)
Total Protein: 5.6 g/dL — ABNORMAL LOW (ref 6.5–8.1)

## 2019-06-09 LAB — GLUCOSE, CAPILLARY
Glucose-Capillary: 180 mg/dL — ABNORMAL HIGH (ref 70–99)
Glucose-Capillary: 204 mg/dL — ABNORMAL HIGH (ref 70–99)
Glucose-Capillary: 180 mg/dL — ABNORMAL HIGH (ref 70–99)
Glucose-Capillary: 104 mg/dL — ABNORMAL HIGH (ref 70–99)

## 2019-06-09 LAB — FERRITIN: Ferritin: 542 ng/mL — ABNORMAL HIGH (ref 24–336)

## 2019-06-09 LAB — BRAIN NATRIURETIC PEPTIDE: B Natriuretic Peptide: 182.5 pg/mL — ABNORMAL HIGH (ref 0.0–100.0)

## 2019-06-09 LAB — C-REACTIVE PROTEIN: CRP: 2.8 mg/dL — ABNORMAL HIGH (ref <1.0)

## 2019-06-09 LAB — D-DIMER, QUANTITATIVE: D-Dimer, Quant: 9.28 ug/mL-FEU — ABNORMAL HIGH (ref 0.00–0.50)

## 2019-06-09 LAB — MAGNESIUM: Magnesium: 1.4 mg/dL — ABNORMAL LOW (ref 1.7–2.4)

## 2019-06-09 MED ORDER — MAGNESIUM SULFATE 4 GM/100ML IV SOLN
4.0000 g | Freq: Once | INTRAVENOUS | Status: AC
  Administered 2019-06-09: 4 g via INTRAVENOUS
  Filled 2019-06-09: qty 100

## 2019-06-09 MED ORDER — PHENOBARBITAL SODIUM 130 MG/ML IJ SOLN
130.0000 mg | Freq: Every day | INTRAMUSCULAR | Status: DC
  Administered 2019-06-09 – 2019-06-12 (×4): 130 mg via INTRAVENOUS
  Filled 2019-06-09 (×4): qty 1

## 2019-06-09 MED ORDER — TACROLIMUS 5 MG/ML IV SOLN
0.0080 mg/kg/d | INTRAVENOUS | Status: DC
  Administered 2019-06-09 – 2019-06-12 (×2): 0.008 mg/kg/d via INTRAVENOUS
  Filled 2019-06-09 (×3): qty 1

## 2019-06-09 MED ORDER — POTASSIUM CHLORIDE 10 MEQ/100ML IV SOLN
10.0000 meq | INTRAVENOUS | Status: AC
  Administered 2019-06-09 (×4): 10 meq via INTRAVENOUS
  Filled 2019-06-09: qty 100

## 2019-06-09 NOTE — Progress Notes (Addendum)
VASCULAR LAB PRELIMINARY  PRELIMINARY  PRELIMINARY  PRELIMINARY  Bilateral lower extremity venous duplex completed.    Preliminary report:  See CV proc for preliminary results.   Messaged Dr. [place] with results.   Brandon [name], RVT 06/09/2019, 2:44 PM

## 2019-06-09 NOTE — Progress Notes (Signed)
PROGRESS NOTE    Brandon [name]  [id number] DOB: 09-09-1940 DOA: 06/30/2019 PCP: Leanna Battles, MD    Brief Narrative:   Brandon [name] is a 79 y.o. male with history of cardiac transplant, diabetes mellitus type 2, hypertension, pacemaker, CVA, chronic kidney disease stage III was recently admitted after a fall at that time patient was diagnosed with COVID-19 infection was discharged home after physical therapy on June 01, 2019.  For the last 2 days per patient's wife patient has not been doing well not eating well and had some diarrhea and was increasingly confused.  Patient blood sugar was running low and EMS was called.  After patient was given some food blood sugar improved but since patient was getting more weak he was brought to the ER.  In the ER patient was hypotensive with lactic acid of 4.6 CRP 22.6 creatinine worsened to 1.9 from 1.6 sodium 134 procalcitonin 0.19 CBC largely unremarkable except for hemoglobin 17.1 D-dimer 1.83 INR 1.1 UA chest x-ray unremarkable.  CT head unremarkable. Patient is oriented to his name and place after patient was started on fluids and empiric antibiotics for sepsis.  Prior to that patient was confused.  Patient admitted for sepsis with Covid infection.  EKG shows sinus tachycardia. Patient's blood pressure improved with fluids and stress dose steroids.   Assessment & Plan:   Principal Problem:   COVID-19 Active Problems:   Sepsis ([place])   Heart transplanted ([place])   CAD (coronary artery disease)   Essential hypertension   Type 2 diabetes mellitus with stage 3 chronic kidney disease (HCC)   Encephalopathy   Acute kidney injury superimposed on chronic kidney disease ([place])   Positive blood culture   Acute metabolic encephalopathy Etiology likely multifactorial given staph aureus septicemia combined with hypoxic respiratory failure secondary to Covid-19 viral pneumonia.  CT head with chronic ischemic microangiopathy and old right  frontal lobe infarct without acute intracranial abnormality.  Continue treatment with ceftriaxone for septicemia and remdesivir/steroids for Covid-pneumonia as below.  Sepsis, present on admission Lactic acidosis Streptococcus mitis/oralis septicemia Blood cultures 2 out of 4 positive for GPC's.  Complicated by history of pacemaker placement.  Discussed with infectious disease, Dr. Megan Salon on 06/05/2019, and believes he could have a true bacteremia with methicillin sensitive coagulase-negative Staphylococcus. TTE 2/3 with EF [id number], grade 1 diastolic dysfunction, LA mildly dilated, normal IVC, valves not well seen but no obvious vegetations. --ID now signed off 06/07/2019 --Lactic acid 4.6-->1.6 --Continue ceftriaxone 2 g IV every 24 hours; plan 7 day course per ID; End: 06/10/2019 --Repeat blood cultures 06/05/2019 negative to date  Acute hypoxic respiratory failure secondary to acute Covid-19 viral pneumonia during the ongoing 2020 Covid 19 Pandemic - POA Diagnosed 05/31/2019.  Patient presents with progressive dyspnea, found to be hypoxic on room air.  0.83, fibrinogen 750, CRP 22.6.  Chest x-ray with progressive bibasilar interstitial and groundglass opacities consistent with multifocal pneumonia. --ddimer 1.83-->3.60-->1.65-->1.88-->1.32-->5.20-->9.28 --CRP 22.6-->16.1-->12.4-->7.5-->3.9-->2.4-->2.8 --Continue remdesivir, completed 5-day course on 06/08/2019 --Continue Solu-Medrol 60 mg every 12 hours --Continue supplemental oxygen, titrate to maintain SPO2 greater than 95%; currently on 15 L heated high flow nasal cannula and 15L NRB --Continue supportive care with vitamin C, zinc, Tylenol, Dulera MDI --Follow CBC, CMP, D-dimer, ferritin, and CRP daily --Continue airborne/contact isolation precautions  EtOH abuse/withdrawl Patient endorses 1.5 bottles of wine daily.  Patient initially started to be tremulous which progressed to agitation and combativeness. --CIWAA protocol w/ ativan and Precedex  gtt --per PCCM; continue phenobarbital 130 mg  IV daily to assist titration off of precedex gtt --Continue nonviolent restraints with mittens/soft wrist restraints as he has been pulling at medical equipment; IV lines and pulling off oxygen devices mask/nasal cannula --Folic acid, thiamine, multivitamin  Elevated D-dimer Acute right peroneal vein DVT D-dimer 1.32-->5.20-->9.21.  Vascular duplex ultrasound bilateral lower extremities remarkable for acute right peroneal DVT. --Continue treatment dose Lovenox; once can tolerate oral, will transition to NOAC  History of cardiac transplant Patient on mycophenolate 1000 mg p.o. twice daily, prednisone 5 mg p.o. daily, tacrolimus 1 mg p.o. twice daily at home. Follows with [facility] cardiology/transplant team. --Continue tacrolimus 1 mg p.o. BID --On high-dose steroids as above for Covid-19 pneumonia --Holding mycophenolate per transplant team, plan restart on 06/14/2019  Type 2 diabetes mellitus Hemoglobin A1c 9.0, not optimally controlled.  On Lantus 24 units subcutaneously daily, Humalog 15 units subcutaneously daily with lunch at home. --continue Levemir 35u [place] daily --Insulin sliding scale for further coverage  Acute on chronic kidney disease stage III Baseline creatinine 1.6, creatinine 2.0 on admission --Cr 2.02-->1.29-->1.44-->1.30-->0.80 --Continue IVF w/ D5 half-normal saline at 100 mL/hr --Continue to hold home lisinopril --Avoid nephrotoxins, renally dose all medications --Monitor renal function daily  Hypokalemia Hypomagnesemia Potassium 3.2 with magnesium 1.4 today.  Will replete. --Follow electrolytes daily to include magnesium  Essential hypertension On lisinopril 5 mg p.o. daily, propranolol 10 mg p.o. twice daily at home.  Patient was hypotensive on presentation. --BP 157/96 today --Continue propranolol 10 mg p.o. twice daily  --Continue to hold home lisinopril for now --Monitor blood pressure closely  Weakness,  debility: --OT currently recommends SNF with PT recommending home health, given his current mental status, will continue therapy while inpatient and await final recommendations when more ready for discharge.   DVT prophylaxis: Heparin Code Status: Full code Family Communication: Update patient's spouse, [name] via telephone this morning Disposition Plan: From home with spouse, to be determined, continues require IV antibiotics per ID to complete 7-day course, continues on high FiO2 with heated high flow nasal cannula and NRB, also with active EtOH withdrawals, unsafe for discharge at this time; continue ICU level of care on Precedex drip.   Consultants:   ID -  Dr. Megan Salon - signed off 06/07/2019  PCCM - Dr. Vaughan Browner  Procedures:   TTE 06/05/2019 IMPRESSIONS    1. Left ventricular ejection fraction, by visual estimation, is 65 to  70%. The left ventricle has hyperdynamic function. There is no left  ventricular hypertrophy.  2. Left ventricular diastolic parameters are consistent with Grade I  diastolic dysfunction (impaired relaxation).  3. The left ventricle has no regional wall motion abnormalities.  4. Global right ventricle has normal systolic function.The right  ventricular size is normal. No increase in right ventricular wall  thickness.  5. Left atrial size was mildly dilated.  6. Right atrial size was normal.  7. The mitral valve is normal in structure. No evidence of mitral valve  regurgitation.  8. The tricuspid valve is grossly normal.  9. The tricuspid valve is grossly normal. Tricuspid valve regurgitation  is not demonstrated.  10. The aortic valve is grossly normal. Aortic valve regurgitation is not  visualized.  11. The pulmonic valve was grossly normal. Pulmonic valve regurgitation is  not visualized.  12. The inferior vena cava is normal in size with greater than 50%  respiratory variability, suggesting right atrial pressure of 3 mmHg.  13. The  interatrial septum was not wll visualized.  14. Interpretation limited due to poor sound  wave transmission. LVEF is  normal. LV appears underfilled. Valves not seen well but no obvious  vegetations noted.limited due to poor sound wave trans,mis   COVID Treatment:   Remdesivir   solumedrol   Subjective:  Patient seen and examined bedside; nonresponsive to verbal stimuli.  Currently on Precedex drip, recently given IV Ativan for further combativeness.  Continues with high FiO2 requirements on 15 L high flow nasal cannula.  Inflammatory markers improving, although D-dimer continues to increase, vascular ultrasound positive for acute DVT right peroneal vein; on treatment dose Lovenox.  PCCM giving phenobarbital for further assistance with withdrawal symptoms.  Unable to obtain any further ROS from patient given his current mental status. No other acute events overnight per nursing staff.  Objective: Vitals:   06/09/19 1000 06/09/19 1100 06/09/19 1200 06/09/19 1203  BP: (!) 123/103 (!) 140/49 (!) 140/50 (!) 159/146  Pulse: 63 65 (!) 59 60  Resp: (!) 21 (!) 28 19 (!) 22  Temp:   (!) 97.2 F (36.2 C)   TempSrc:   Axillary   SpO2: 98% (!) 89% 100% 94%  Weight:      Height:        Intake/Output Summary (Last 24 hours) at 06/09/2019 1336 Last data filed at 06/09/2019 1218 Gross per 24 hour  Intake 2596.47 ml  Output 2715 ml  Net -118.53 ml   Filed Weights   06/06/19 0500 06/08/19 0500 06/09/19 0442  Weight: 88.3 kg 90.5 kg 86.2 kg    Examination:  General exam: Unresponsive to verbal stimuli, obtunded Respiratory system: Clear to auscultation. Respiratory effort normal.  On 15 L heated high flow nasal cannula with 15 L NRB with SPO2 95% Cardiovascular system: S1 & S2 heard, RRR. No JVD, murmurs, rubs, gallops or clicks. No pedal edema. Gastrointestinal system: Abdomen is nondistended, soft and nontender. No organomegaly or masses felt. Normal bowel sounds heard. Central nervous  system:  obtunded; withdraws to painful stimuli Extremities: Moves all extremities independently Skin: No rashes, lesions or ulcers Psychiatry: Unable to assess secondary to current mental status.    Data Reviewed: I have personally reviewed following labs and imaging studies  CBC: Recent Labs  Lab 06/10/2019 2257 06/23/2019 2257 06/04/19 0444 06/05/19 0205 06/06/19 0225 06/07/19 0137 06/09/19 0440  WBC 6.7   < > 4.5 5.0 8.8 5.7 4.7  NEUTROABS 5.6  --  3.7  --   --   --   --   HGB 17.1*   < > 13.2 13.5 14.3 12.8* 13.4  HCT 52.2*   < > 39.2 40.5 42.9 38.5* 41.5  MCV 93.2   < > 92.9 91.8 91.5 93.7 93.5  PLT 162   < > 107* 134* 234 180 150   < > = values in this interval not displayed.   Basic Metabolic Panel: Recent Labs  Lab 06/04/19 0444 06/06/19 0225 06/07/19 0137 06/08/19 0429 06/09/19 0440  NA 135 142 144 146* 148*  K 4.1 3.3* 3.6 3.4* 3.2*  CL 104 102 109 107 109  CO2 18* 25 26 23 28   GLUCOSE 200* 113* 108* 197* 177*  BUN 44* 55* 60* 62* 42*  CREATININE 2.02* 1.29* 1.44* 1.30* 0.80  CALCIUM 7.6* 8.4* 8.0* 7.3* 6.9*  MG  --   --   --   --  1.4*   GFR: Estimated Creatinine Clearance: 84.1 mL/min (by C-G formula based on SCr of 0.8 mg/dL). Liver Function Tests: Recent Labs  Lab 06/30/2019 2257 06/04/19 0444 06/09/19 0440  AST  27 19 20   ALT 23 19 23   ALKPHOS 66 48 85  BILITOT 1.4* 0.7 0.6  PROT 7.6 5.6* 5.6*  ALBUMIN 4.2 3.0* 2.8*   No results for input(s): LIPASE, AMYLASE in the last 168 hours. No results for input(s): AMMONIA in the last 168 hours. Coagulation Profile: Recent Labs  Lab 06/27/2019 2257  INR 1.1   Cardiac Enzymes: No results for input(s): CKTOTAL, CKMB, CKMBINDEX, TROPONINI in the last 168 hours. BNP (last 3 results) No results for input(s): PROBNP in the last 8760 hours. HbA1C: No results for input(s): HGBA1C in the last 72 hours. CBG: Recent Labs  Lab 06/08/19 0741 06/08/19 1144 06/08/19 1726 06/08/19 2132 06/09/19 0740   GLUCAP 222* 226* 244* 201* 180*   Lipid Profile: No results for input(s): CHOL, HDL, LDLCALC, TRIG, CHOLHDL, LDLDIRECT in the last 72 hours. Thyroid Function Tests: No results for input(s): TSH, T4TOTAL, FREET4, T3FREE, THYROIDAB in the last 72 hours. Anemia Panel: Recent Labs    06/08/19 0429 06/09/19 0440  FERRITIN 622* 542*   Sepsis Labs: Recent Labs  Lab 06/26/2019 2257 06/20/2019 2257 06/04/19 0110 06/04/19 0444 06/04/19 0706 06/04/19 1155  PROCALCITON 0.19  --   --  0.24  --  0.24  LATICACIDVEN 4.6*   < > 2.7* 1.7 2.6* 1.6   < > = values in this interval not displayed.    Recent Results (from the past 240 hour(s))  Blood Culture (routine x 2)     Status: None   Collection Time: 05/30/19 11:45 PM   Specimen: BLOOD  Result Value Ref Range Status   Specimen Description BLOOD LEFT ARM  Final   Special Requests   Final    BOTTLES DRAWN AEROBIC AND ANAEROBIC Blood Culture adequate volume   Culture   Final    NO GROWTH 5 DAYS Performed at [facility], [address] [place], [place] [address]    Report Status 06/05/2019 FINAL  Final  Blood Culture (routine x 2)     Status: None   Collection Time: 05/30/19 11:50 PM   Specimen: BLOOD  Result Value Ref Range Status   Specimen Description BLOOD LEFT HAND  Final   Special Requests   Final    BOTTLES DRAWN AEROBIC ONLY Blood Culture adequate volume   Culture   Final    NO GROWTH 5 DAYS Performed at [facility], [place] [address] [place], [place] [address]    Report Status 06/05/2019 FINAL  Final  Urine culture     Status: Abnormal   Collection Time: 05/31/19 12:37 AM   Specimen: In/Out Cath Urine  Result Value Ref Range Status   Specimen Description IN/OUT CATH URINE  Final   Special Requests   Final    NONE Performed at [facility], [place] [address] [place], [place] [address]    Culture 20,000 COLONIES/mL ESCHERICHIA COLI (A)  Final   Report Status 06/01/2019 FINAL  Final   Organism ID,  Bacteria ESCHERICHIA COLI (A)  Final      Susceptibility   Escherichia coli - MIC*    AMPICILLIN <=2 SENSITIVE Sensitive     CEFAZOLIN <=4 SENSITIVE Sensitive     CEFTRIAXONE <=0.25 SENSITIVE Sensitive     CIPROFLOXACIN <=0.25 SENSITIVE Sensitive     GENTAMICIN <=1 SENSITIVE Sensitive     IMIPENEM <=0.25 SENSITIVE Sensitive     NITROFURANTOIN <=16 SENSITIVE Sensitive     TRIMETH/SULFA <=20 SENSITIVE Sensitive     AMPICILLIN/SULBACTAM <=2 SENSITIVE  Sensitive     PIP/TAZO <=4 SENSITIVE Sensitive     * 20,000 COLONIES/mL ESCHERICHIA COLI  Culture, blood (Routine x 2)     Status: Abnormal   Collection Time: 06/30/2019 10:57 PM   Specimen: BLOOD  Result Value Ref Range Status   Specimen Description BLOOD LEFT ARM  Final   Special Requests   Final    BOTTLES DRAWN AEROBIC AND ANAEROBIC Blood Culture adequate volume   Culture  Setup Time   Final    GRAM POSITIVE COCCI IN PAIRS IN BOTH AEROBIC AND ANAEROBIC BOTTLES CRITICAL RESULT CALLED TO, READ BACK BY AND VERIFIED WITH: Coffey [id number] 06/04/19 A BROWNING Performed at [facility], [place] [address], [place], [place] [address]    Culture STREPTOCOCCUS MITIS/ORALIS (A)  Final   Report Status 06/06/2019 FINAL  Final   Organism ID, Bacteria STREPTOCOCCUS MITIS/ORALIS  Final      Susceptibility   Streptococcus mitis/oralis - MIC*    TETRACYCLINE 4 SENSITIVE Sensitive     VANCOMYCIN 0.5 SENSITIVE Sensitive     CLINDAMYCIN >=1 RESISTANT Resistant     PENICILLIN Value in next row Sensitive      SENSITIVE<=0.06    CEFTRIAXONE Value in next row Sensitive      SENSITIVE<=0.12    * STREPTOCOCCUS MITIS/ORALIS  Urine culture     Status: Abnormal   Collection Time: 06/27/2019 11:38 PM   Specimen: Urine, Clean Catch  Result Value Ref Range Status   Specimen Description URINE, CLEAN CATCH  Final   Special Requests   Final    NONE Performed at [facility], [place] [address], [place], [place] [address]    Culture MULTIPLE SPECIES  PRESENT, SUGGEST RECOLLECTION (A)  Final   Report Status 06/04/2019 FINAL  Final  Culture, blood (Routine x 2)     Status: Abnormal   Collection Time: 06/04/19  1:10 AM   Specimen: BLOOD RIGHT FOREARM  Result Value Ref Range Status   Specimen Description BLOOD RIGHT FOREARM  Final   Special Requests   Final    BOTTLES DRAWN AEROBIC AND ANAEROBIC Blood Culture adequate volume   Culture  Setup Time   Final    ANAEROBIC BOTTLE ONLY GRAM POSITIVE COCCI CRITICAL RESULT CALLED TO, READ BACK BY AND VERIFIED WITH: Deborra Medina [facility] 06/04/19 2254 JDW Performed at [facility], [place] [address], [place], [place] [address]    Culture STAPHYLOCOCCUS EPIDERMIDIS (A)  Final   Report Status 06/06/2019 FINAL  Final   Organism ID, Bacteria STAPHYLOCOCCUS EPIDERMIDIS  Final      Susceptibility   Staphylococcus epidermidis - MIC*    CIPROFLOXACIN <=0.5 SENSITIVE Sensitive     ERYTHROMYCIN <=0.25 SENSITIVE Sensitive     GENTAMICIN <=0.5 SENSITIVE Sensitive     OXACILLIN <=0.25 SENSITIVE Sensitive     TETRACYCLINE >=16 RESISTANT Resistant     VANCOMYCIN 1 SENSITIVE Sensitive     TRIMETH/SULFA <=10 SENSITIVE Sensitive     CLINDAMYCIN <=0.25 SENSITIVE Sensitive     RIFAMPIN <=0.5 SENSITIVE Sensitive     Inducible Clindamycin NEGATIVE Sensitive     * STAPHYLOCOCCUS EPIDERMIDIS  Blood Culture ID Panel (Reflexed)     Status: Abnormal   Collection Time: 06/04/19  1:10 AM  Result Value Ref Range Status   Enterococcus species NOT DETECTED NOT DETECTED Final   Listeria monocytogenes NOT DETECTED NOT DETECTED Final   Staphylococcus species DETECTED (A) NOT DETECTED Final    Comment: Methicillin (oxacillin) susceptible coagulase  negative staphylococcus. Possible blood culture contaminant (unless isolated from more than one blood culture draw or clinical case suggests pathogenicity). No antibiotic treatment is indicated for blood  culture contaminants. CRITICAL RESULT CALLED TO, READ BACK BY AND VERIFIED  WITH: T MEYER PHARMD 06/04/19 2254 JDW    Staphylococcus aureus (BCID) NOT DETECTED NOT DETECTED Final   Methicillin resistance NOT DETECTED NOT DETECTED Final   Streptococcus species NOT DETECTED NOT DETECTED Final   Streptococcus agalactiae NOT DETECTED NOT DETECTED Final   Streptococcus pneumoniae NOT DETECTED NOT DETECTED Final   Streptococcus pyogenes NOT DETECTED NOT DETECTED Final   Acinetobacter baumannii NOT DETECTED NOT DETECTED Final   Enterobacteriaceae species NOT DETECTED NOT DETECTED Final   Enterobacter cloacae complex NOT DETECTED NOT DETECTED Final   Escherichia coli NOT DETECTED NOT DETECTED Final   Klebsiella oxytoca NOT DETECTED NOT DETECTED Final   Klebsiella pneumoniae NOT DETECTED NOT DETECTED Final   Proteus species NOT DETECTED NOT DETECTED Final   Serratia marcescens NOT DETECTED NOT DETECTED Final   Haemophilus influenzae NOT DETECTED NOT DETECTED Final   Neisseria meningitidis NOT DETECTED NOT DETECTED Final   Pseudomonas aeruginosa NOT DETECTED NOT DETECTED Final   Candida albicans NOT DETECTED NOT DETECTED Final   Candida glabrata NOT DETECTED NOT DETECTED Final   Candida krusei NOT DETECTED NOT DETECTED Final   Candida parapsilosis NOT DETECTED NOT DETECTED Final   Candida tropicalis NOT DETECTED NOT DETECTED Final    Comment: Performed at [facility], [place]. [address] [place], [place] [address]  MRSA PCR Screening     Status: None   Collection Time: 06/04/19  8:35 AM   Specimen: Nasopharyngeal  Result Value Ref Range Status   MRSA by PCR NEGATIVE NEGATIVE Final    Comment:        The GeneXpert MRSA Assay (FDA approved for NASAL specimens only), is one component of a comprehensive MRSA colonization surveillance program. It is not intended to diagnose MRSA infection nor to guide or monitor treatment for MRSA infections. Performed at [facility], [place] [address], [place], [place] [address]   Culture, blood (routine x 2)     Status:  None (Preliminary result)   Collection Time: 06/05/19  3:00 PM   Specimen: BLOOD  Result Value Ref Range Status   Specimen Description   Final    BLOOD LEFT HAND Performed at [facility] [address], [place], [place] [address]    Special Requests   Final    BOTTLES DRAWN AEROBIC ONLY Blood Culture results may not be optimal due to an inadequate volume of blood received in culture bottles Performed at [facility] [address] [place], [place] [address]    Culture   Final    NO GROWTH 4 DAYS Performed at [facility], [place] [address], [place], [place] [address]    Report Status PENDING  Incomplete  Culture, blood (routine x 2)     Status: None (Preliminary result)   Collection Time: 06/05/19  3:06 PM   Specimen: BLOOD  Result Value Ref Range Status   Specimen Description   Final    BLOOD RIGHT HAND Performed at [facility] [address] [place], [place] [address]    Special Requests   Final    BOTTLES DRAWN AEROBIC ONLY Blood Culture adequate volume Performed at [facility] [address], [place], [place] [address]    Culture   Final  NO GROWTH 4 DAYS Performed at [facility], [place] [address], [place], [place] [address]    Report Status PENDING  Incomplete  MRSA PCR Screening     Status: None   Collection Time: 06/07/19  6:48 PM   Specimen: Nasal Mucosa; Nasopharyngeal  Result Value Ref Range Status   MRSA by PCR NEGATIVE NEGATIVE Final    Comment:        The GeneXpert MRSA Assay (FDA approved for NASAL specimens only), is one component of a comprehensive MRSA colonization surveillance program. It is not intended to diagnose MRSA infection nor to guide or monitor treatment for MRSA infections. Performed at [facility], [place] [address] [place], [place] [address]          Radiology Studies: No results found.      Scheduled Meds: .  allopurinol  300 mg Oral Daily  . vitamin C  500 mg Oral Daily  . chlorhexidine  15 mL Mouth Rinse BID  . Chlorhexidine Gluconate Cloth  6 each Topical Daily  . clopidogrel  75 mg Oral QHS  . enoxaparin (LOVENOX) injection  1 mg/kg Subcutaneous Q12H  . folic acid  1 mg Oral Daily  . insulin aspart  0-15 Units Subcutaneous TID WC  . insulin aspart  0-5 Units Subcutaneous QHS  . insulin detemir  35 Units Subcutaneous Daily  . Ipratropium-Albuterol  1 puff Inhalation Q6H  . latanoprost  1 drop Both Eyes QHS  . mouth rinse  15 mL Mouth Rinse q12n4p  . methylPREDNISolone (SOLU-MEDROL) injection  60 mg Intravenous Q12H  . mometasone-formoterol  2 puff Inhalation BID  . multivitamin with minerals  1 tablet Oral Daily  . PHENObarbital  130 mg Intravenous Daily  . potassium chloride  40 mEq Per Tube Once  . pravastatin  20 mg Oral QHS  . propranolol  10 mg Oral BID AC  . thiamine  100 mg Oral Daily   Or  . thiamine  100 mg Intravenous Daily  . zinc sulfate  220 mg Oral Daily   Continuous Infusions: . cefTRIAXone (ROCEPHIN)  IV Stopped (06/08/19 1925)  . dexmedetomidine (PRECEDEX) IV infusion 0.5 mcg/kg/hr (06/09/19 1200)  . dextrose 5 % and 0.45 % NaCl with KCl 40 mEq/L 100 mL/hr at 06/09/19 1200  . tacrolimus (PROGRAF) IV       LOS: 5 days    Critical Care Time Upon my evaluation, this patient had a high probability of imminent or life-threatening deterioration due to severe hypoxic respiratory failure secondary to Covid-19 viral pneumonia, EtOH withdrawals, which required my direct attention, intervention, and personal management.  I have personally provided 41 minutes of critical care time exclusive of my time spent on separately billable procedures.  Time includes review of laboratory data, radiology results, discussion with consultants, and monitoring for potential decompensation.       Elexius Minar J [place] DO [facility] Available via Epic secure chat 7am-7pm After these  hours, please refer to coverage provider listed on [email or web address] 06/09/2019, 1:36 PM

## 2019-06-09 NOTE — Progress Notes (Signed)
   NAME:  Brandon [name], MRN:  [id number], DOB:  1940/08/09, LOS: 5 ADMISSION DATE:  06/18/2019, CONSULTATION DATE:  06/06/2018 REFERRING MD:  Eric [place] MD, CHIEF COMPLAINT: Encephalopathy, EtOH withdrawal  Brief History   79 year old with history of cardiac transplant, diabetes, hypertension, pacemaker, CVA, chronic kidney disease admitted with COVID-19 infection on 2/1.  Hospital course complicated by sepsis, lactic acidosis, staph, strep bacteremia.  Treated with remdesivir, Solu-Medrol and antibiotics  Transferred to ICU on 2/5 for increasing encephalopathy, agitation, EtOH withdrawal requiring multiple doses of Ativan. PCCM consulted for Precedex Patient drinks 1.5 bottles of wine daily  Past Medical History    has a past medical history of Diabetes mellitus without complication ([place]), Hypertension, Pacemaker, and Stroke ([place]).  CKD, cardiac transplant   Significant Hospital Events   2/1-admit 2/4-transfer to ICU for Precedex  Consults:  PCCM  Procedures:    Significant Diagnostic Tests:  CT head 2/2-no acute abnormality, chronic stroke, ischemic microangiopathy  Micro Data:  Urine culture 1/29-E. coli Blood cultures 2/1-Streptococcus Blood cultures 2/2-staph epidermidis  Antimicrobials:  Ceftriaxone Remdesivir Solu-Medrol  Interim history/subjective:  No acute events.  Continues on precedex with intermittent agitation  Objective   Blood pressure (!) 151/93, pulse 62, temperature (!) 97.5 F (36.4 C), temperature source Axillary, resp. rate 14, height 5\' 9"  (1.753 m), weight 86.2 kg, SpO2 100 %.    FiO2 (%):  [100 %] 100 %   Intake/Output Summary (Last 24 hours) at 06/09/2019 [id number] Last data filed at 06/09/2019 0700 Gross per 24 hour  Intake 2187.85 ml  Output 1930 ml  Net 257.85 ml   Filed Weights   06/06/19 0500 06/08/19 0500 06/09/19 0442  Weight: 88.3 kg 90.5 kg 86.2 kg    Examination: Gen:      No acute distress HEENT:  EOMI, sclera anicteric Neck:      No masses; no thyromegaly Lungs:    Clear to auscultation bilaterally; normal respiratory effort CV:         Regular rate and rhythm; no murmurs Abd:      + bowel sounds; soft, non-tender; no palpable masses, no distension Ext:    No edema; adequate peripheral perfusion Skin:      Warm and dry; no rash Neuro: Somnolent  Resolved Hospital Problem list     Assessment & Plan:  COVID-19 pneumonia Continue remdesivir, steroids, supplemental oxygen  Encephalopathy due to alcohol withdrawal Continue CIWA protocol.  Start standing phenobarb Wean off Precedex drip as tolerated  Staph bacteremia Continue antibiotics  Heart transplant Continue CellCept, Prograf  Best practice:  Diet: NPO Pain/Anxiety/Delirium protocol (if indicated): NA VAP protocol (if indicated): NA DVT prophylaxis: Lovenox GI prophylaxis: NA Glucose control: SSI, insulin Mobility: Bed Code Status: Full Family Communication: per primary Disposition: ICU  Critical care time:    The patient is critically ill with multiple organ system failure and requires high complexity decision making for assessment and support, frequent evaluation and titration of therapies, advanced monitoring, review of radiographic studies and interpretation of complex data.   Critical Care Time devoted to patient care services, exclusive of separately billable procedures, described in this note is 40 minutes.   Marshell Garfinkel MD [facility] Please see [email or web address] for pager details.  06/09/2019, 8:42 AM

## 2019-06-10 DIAGNOSIS — N189 Chronic kidney disease, unspecified: Secondary | ICD-10-CM

## 2019-06-10 DIAGNOSIS — I25811 Atherosclerosis of native coronary artery of transplanted heart without angina pectoris: Secondary | ICD-10-CM

## 2019-06-10 DIAGNOSIS — N179 Acute kidney failure, unspecified: Secondary | ICD-10-CM

## 2019-06-10 LAB — CBC
HCT: 41 % (ref 39.0–52.0)
Hemoglobin: 13.1 g/dL (ref 13.0–17.0)
MCH: 29.7 pg (ref 26.0–34.0)
MCHC: 32 g/dL (ref 30.0–36.0)
MCV: 93 fL (ref 80.0–100.0)
Platelets: 184 10*3/uL (ref 150–400)
RBC: 4.41 MIL/uL (ref 4.22–5.81)
RDW: 12.6 % (ref 11.5–15.5)
WBC: 6.2 10*3/uL (ref 4.0–10.5)
nRBC: 0 % (ref 0.0–0.2)

## 2019-06-10 LAB — GLUCOSE, CAPILLARY
Glucose-Capillary: 125 mg/dL — ABNORMAL HIGH (ref 70–99)
Glucose-Capillary: 193 mg/dL — ABNORMAL HIGH (ref 70–99)
Glucose-Capillary: 288 mg/dL — ABNORMAL HIGH (ref 70–99)

## 2019-06-10 LAB — PHOSPHORUS
Phosphorus: 3.4 mg/dL (ref 2.5–4.6)
Phosphorus: 4.7 mg/dL — ABNORMAL HIGH (ref 2.5–4.6)

## 2019-06-10 LAB — FERRITIN: Ferritin: 659 ng/mL — ABNORMAL HIGH (ref 24–336)

## 2019-06-10 LAB — COMPREHENSIVE METABOLIC PANEL
ALT: 28 U/L (ref 0–44)
AST: 26 U/L (ref 15–41)
Albumin: 2.4 g/dL — ABNORMAL LOW (ref 3.5–5.0)
Alkaline Phosphatase: 93 U/L (ref 38–126)
Anion gap: 10 (ref 5–15)
BUN: 39 mg/dL — ABNORMAL HIGH (ref 8–23)
CO2: 26 mmol/L (ref 22–32)
Calcium: 6.7 mg/dL — ABNORMAL LOW (ref 8.9–10.3)
Chloride: 110 mmol/L (ref 98–111)
Creatinine, Ser: 1.03 mg/dL (ref 0.61–1.24)
GFR calc Af Amer: >60 mL/min (ref >60)
GFR calc non Af Amer: >60 mL/min (ref >60)
Glucose, Bld: 86 mg/dL (ref 70–99)
Potassium: 4.2 mmol/L (ref 3.5–5.1)
Sodium: 146 mmol/L — ABNORMAL HIGH (ref 135–145)
Total Bilirubin: 0.6 mg/dL (ref 0.3–1.2)
Total Protein: 5.5 g/dL — ABNORMAL LOW (ref 6.5–8.1)

## 2019-06-10 LAB — MAGNESIUM
Magnesium: 2.2 mg/dL (ref 1.7–2.4)
Magnesium: 1.8 mg/dL (ref 1.7–2.4)

## 2019-06-10 LAB — C-REACTIVE PROTEIN: CRP: 4.8 mg/dL — ABNORMAL HIGH (ref <1.0)

## 2019-06-10 LAB — D-DIMER, QUANTITATIVE: D-Dimer, Quant: 3.84 ug/mL-FEU — ABNORMAL HIGH (ref 0.00–0.50)

## 2019-06-10 MED ORDER — FOLIC ACID 1 MG PO TABS
1.0000 mg | ORAL_TABLET | Freq: Every day | ORAL | Status: DC
  Administered 2019-06-12: 11:00:00 1 mg via ORAL
  Filled 2019-06-10: qty 1

## 2019-06-10 MED ORDER — VITAL HIGH PROTEIN PO LIQD: 1000.0000 mL | ORAL | Status: DC

## 2019-06-10 MED ORDER — INSULIN ASPART 100 UNIT/ML ~~LOC~~ SOLN
0.0000 [IU] | SUBCUTANEOUS | Status: DC
  Administered 2019-06-10: 11 [IU] via SUBCUTANEOUS
  Administered 2019-06-11: 3 [IU] via SUBCUTANEOUS
  Administered 2019-06-11: 08:00:00 2 [IU] via SUBCUTANEOUS
  Administered 2019-06-12 (×2): 3 [IU] via SUBCUTANEOUS

## 2019-06-10 MED ORDER — PRO-STAT SUGAR FREE PO LIQD: 30.0000 mL | Freq: Two times a day (BID) | ORAL | Status: DC

## 2019-06-10 MED ORDER — SODIUM CHLORIDE 0.9% FLUSH
10.0000 mL | Freq: Two times a day (BID) | INTRAVENOUS | Status: DC
  Administered 2019-06-10 – 2019-06-12 (×6): 10 mL

## 2019-06-10 MED ORDER — SODIUM CHLORIDE 0.9% FLUSH: 10.0000 mL | INTRAVENOUS | Status: DC | PRN

## 2019-06-10 MED ORDER — FOLIC ACID 5 MG/ML IJ SOLN
1.0000 mg | Freq: Every day | INTRAMUSCULAR | Status: DC
  Administered 2019-06-10 – 2019-06-11 (×2): 1 mg via INTRAVENOUS
  Filled 2019-06-10 (×4): qty 0.2

## 2019-06-10 MED ORDER — HYDRALAZINE HCL 20 MG/ML IJ SOLN
10.0000 mg | Freq: Four times a day (QID) | INTRAMUSCULAR | Status: DC | PRN
  Administered 2019-06-10 (×2): 10 mg via INTRAVENOUS
  Filled 2019-06-10 (×3): qty 1

## 2019-06-10 MED ORDER — QUETIAPINE FUMARATE 50 MG PO TABS
50.0000 mg | ORAL_TABLET | Freq: Two times a day (BID) | ORAL | Status: DC
  Administered 2019-06-12: 50 mg via ORAL
  Filled 2019-06-10: qty 1

## 2019-06-10 NOTE — Progress Notes (Signed)
NAME:  Brandon [name], MRN:  [id number], DOB:  1940/07/15, LOS: 6 ADMISSION DATE:  06/17/2019, CONSULTATION DATE:  2/5 REFERRING MD:  [place] CHIEF COMPLAINT:  Encephalopathy, withdrawal   Brief History   79 y/o with an extensive past medical history including cardiac transplant and alcoholism admitted on 2/1 with COVID, strep bacteremia.  Moved to the ICU on 2/5 due to worsening agitation, started on precedex for likely alcohol withdrawal.    Past Medical History  DM2 HTN Pacemaker CKD Cardiac transplant Alcohol abuse  Significant Hospital Events   2/1-admit 2/4-transfer to ICU for Precedex  Consults:  PCCM  Procedures:    Significant Diagnostic Tests:  CT head 2/2-no acute abnormality, chronic stroke, ischemic microangiopathy  Micro Data:  Urine culture 1/29-E. coli Blood cultures 2/1-Streptococcus mitis/orals 2/2 bottles Blood cultures 2/2-staph epidermidis Blood culture 2/3 (4 bottles) > no growth to date  Antimicrobials:  Ceftriaxone Remdesivir Solu-Medrol  Interim history/subjective:  Worsening agitation Hypoxemia Hasn't had anything to eat in 3 days  Objective   Blood pressure (!) 178/74, pulse 69, temperature 98.9 F (37.2 C), temperature source Axillary, resp. rate 19, height 5\' 9"  (1.753 m), weight 90.2 kg, SpO2 93 %.    FiO2 (%):  [100 %] 100 %   Intake/Output Summary (Last 24 hours) at 06/10/2019 0747 Last data filed at 06/10/2019 0700 Gross per 24 hour  Intake 3312.72 ml  Output 1700 ml  Net 1612.72 ml   Filed Weights   06/08/19 0500 06/09/19 0442 06/10/19 0459  Weight: 90.5 kg 86.2 kg 90.2 kg    Examination:  General:  Moaning,agitated, but breathing comfortably in bed HENT: NCAT OP clear PULM: CTA B, normal effort CV: RRR, no mgr GI: BS+, soft, nontender MSK: normal bulk and tone Neuro: no eye opening, doesn't follow commands  Resolved Hospital Problem list     Assessment & Plan:  COVID 19 pneumonia causing severe acute  hypoxemic respiratory failure High flow nasal cannula + NRB mask Tolerate periods of hypoxemia, goal at rest is greater than 85% SaO2, with movement ideally above 75% Decision for intubation should be based on a change in mental status or physical evidence of ventilatory failure such as nasal flaring, accessory muscle use, paradoxical breathing Out of bed to chair as able Incentive spirometry is important, use every hour Prone positioning while in bed  Acute encephalopathy due to alcohol withdrawal: worse Add seroquel Continue phenobarb Check QTc Increase precedex ceiling Prn ativan  Bacteremia  Abx per [facility]  History of cardiac transplant Prograf, cellept  Nutrition: start tube feeding  Given his alcohol withdrawal, age, severe COVID and comorbid illnesses (include immunocompromised state) would not expect him to have a good outcome with intubation.  Will discuss goals of care conversations with family.  Best practice:   Per [facility]  Labs   CBC: Recent Labs  Lab 06/27/2019 2257 06/05/2019 2257 06/04/19 0444 06/04/19 0444 06/05/19 0205 06/06/19 0225 06/07/19 0137 06/09/19 0440 06/10/19 0620  WBC 6.7   < > 4.5   < > 5.0 8.8 5.7 4.7 6.2  NEUTROABS 5.6  --  3.7  --   --   --   --   --   --   HGB 17.1*   < > 13.2   < > 13.5 14.3 12.8* 13.4 13.1  HCT 52.2*   < > 39.2   < > 40.5 42.9 38.5* 41.5 41.0  MCV 93.2   < > 92.9   < > 91.8 91.5 93.7 93.5 93.0  PLT 162   < > 107*   < > 134* 234 180 150 184   < > = values in this interval not displayed.    Basic Metabolic Panel: Recent Labs  Lab 06/04/19 0444 06/06/19 0225 06/07/19 0137 06/08/19 0429 06/09/19 0440  NA 135 142 144 146* 148*  K 4.1 3.3* 3.6 3.4* 3.2*  CL 104 102 109 107 109  CO2 18* 25 26 23 28   GLUCOSE 200* 113* 108* 197* 177*  BUN 44* 55* 60* 62* 42*  CREATININE 2.02* 1.29* 1.44* 1.30* 0.80  CALCIUM 7.6* 8.4* 8.0* 7.3* 6.9*  MG  --   --   --   --  1.4*   GFR: Estimated Creatinine Clearance: 85.9 mL/min (by  C-G formula based on SCr of 0.8 mg/dL). Recent Labs  Lab 06/21/2019 2257 06/20/2019 2257 06/04/19 0110 06/04/19 0444 06/04/19 0706 06/04/19 1155 06/05/19 0205 06/06/19 0225 06/07/19 0137 06/09/19 0440 06/10/19 0620  PROCALCITON 0.19  --   --  0.24  --  0.24  --   --   --   --   --   WBC 6.7   < >  --  4.5  --   --    < > 8.8 5.7 4.7 6.2  LATICACIDVEN 4.6*   < > 2.7* 1.7 2.6* 1.6  --   --   --   --   --    < > = values in this interval not displayed.    Liver Function Tests: Recent Labs  Lab 06/17/2019 2257 06/04/19 0444 06/09/19 0440  AST 27 19 20   ALT 23 19 23   ALKPHOS 66 48 85  BILITOT 1.4* 0.7 0.6  PROT 7.6 5.6* 5.6*  ALBUMIN 4.2 3.0* 2.8*   No results for input(s): LIPASE, AMYLASE in the last 168 hours. No results for input(s): AMMONIA in the last 168 hours.  ABG No results found for: PHART, PCO2ART, PO2ART, HCO3, TCO2, ACIDBASEDEF, O2SAT   Coagulation Profile: Recent Labs  Lab 06/23/2019 2257  INR 1.1    Cardiac Enzymes: No results for input(s): CKTOTAL, CKMB, CKMBINDEX, TROPONINI in the last 168 hours.  HbA1C: Hgb A1c MFr Bld  Date/Time Value Ref Range Status  06/05/2019 02:05 AM 9.0 (H) 4.8 - 5.6 % Final    Comment:    (NOTE) Pre diabetes:          5.7%-6.4% Diabetes:              >6.4% Glycemic control for   <7.0% adults with diabetes   05/31/2019 02:23 AM 8.3 (H) 4.8 - 5.6 % Final    Comment:    (NOTE) Pre diabetes:          5.7%-6.4% Diabetes:              >6.4% Glycemic control for   <7.0% adults with diabetes     CBG: Recent Labs  Lab 06/08/19 2132 06/09/19 0740 06/09/19 1132 06/09/19 1643 06/09/19 2130  GLUCAP 201* 180* 204* 180* 104*     Critical care time: 35 minutes     Roselie Awkward, MD [facility] Pager: [phone] Cell: [phone] If no response, call [phone]

## 2019-06-10 NOTE — Progress Notes (Signed)
OT Cancellation Note  Patient Details Name: Brandon [name] MRN: [id number] DOB: 10/07/1940   Cancelled Treatment:    Reason Eval/Treat Not Completed: Medical issues which prohibited therapy. Per RN pt is heavily sedated. RN reports that when pt is not sedated, he is restless and unable to follow commands. RN requested therapy hold this date and follow up tomorrow.   Mauri [place] 06/10/2019, 2:31 PM

## 2019-06-10 NOTE — Progress Notes (Signed)
PT Cancellation Note  Patient Details Name: Brandon [name] MRN: [id number] DOB: 1941-03-04   Cancelled Treatment:    Reason Eval/Treat Not Completed: Medical issues which prohibited therapy.  Per RN, pt is heavily sedated, and when he is not, he is very restless, not following commands.  He is not appropriate today, but PT will follow along acutely for medical stability.  Thanks,  Verdene Lennert, PT, DPT  Acute Rehabilitation [phone] pager [phone] office  @ [facility]: [phone]     [name] 06/10/2019, 1:14 PM

## 2019-06-10 NOTE — Progress Notes (Signed)
PROGRESS NOTE    Brandon [name]  [id number] DOB: 1941-03-02 DOA: 06/24/2019 PCP: Leanna Battles, MD    Brief Narrative:   Brandon [name] is a 79 y.o. male with history of cardiac transplant, diabetes mellitus type 2, hypertension, pacemaker, CVA, chronic kidney disease stage III was recently admitted after a fall at that time patient was diagnosed with COVID-19 infection was discharged home after physical therapy on June 01, 2019.  For the last 2 days per patient's wife patient has not been doing well not eating well and had some diarrhea and was increasingly confused.  Patient blood sugar was running low and EMS was called.  After patient was given some food blood sugar improved but since patient was getting more weak he was brought to the ER.  In the ER patient was hypotensive with lactic acid of 4.6 CRP 22.6 creatinine worsened to 1.9 from 1.6 sodium 134 procalcitonin 0.19 CBC largely unremarkable except for hemoglobin 17.1 D-dimer 1.83 INR 1.1 UA chest x-ray unremarkable.  CT head unremarkable. Patient is oriented to his name and place after patient was started on fluids and empiric antibiotics for sepsis.  Prior to that patient was confused.  Patient admitted for sepsis with Covid infection.  EKG shows sinus tachycardia. Patient's blood pressure improved with fluids and stress dose steroids.   Assessment & Plan:   Principal Problem:   COVID-19 Active Problems:   Sepsis ([place])   Heart transplanted ([place])   CAD (coronary artery disease)   Essential hypertension   Type 2 diabetes mellitus with stage 3 chronic kidney disease (HCC)   Encephalopathy   Acute kidney injury superimposed on chronic kidney disease ([place])   Positive blood culture   Acute metabolic encephalopathy Etiology likely multifactorial given staph aureus septicemia combined with hypoxic respiratory failure secondary to Covid-19 viral pneumonia.  CT head with chronic ischemic microangiopathy and old right  frontal lobe infarct without acute intracranial abnormality.  Continue treatment with ceftriaxone for septicemia and remdesivir/steroids for Covid-pneumonia as below.  Sepsis, present on admission Lactic acidosis Streptococcus mitis/oralis septicemia Blood cultures 2 out of 4 positive for GPC's.  Complicated by history of pacemaker placement.  Discussed with infectious disease, Dr. Megan Salon on 06/05/2019, and believes he could have a true bacteremia with methicillin sensitive coagulase-negative Staphylococcus. TTE 2/3 with EF [id number], grade 1 diastolic dysfunction, LA mildly dilated, normal IVC, valves not well seen but no obvious vegetations.  Lactic acid improved from 4.6-1.6 with IV fluid hydration and antibiotics.  Infectious disease was consulted and recommended to complete 7-day course of ceftriaxone, which was finished on 06/10/2019.  Repeat blood cultures from 06/05/2019, remain negative to date.  Acute hypoxic respiratory failure secondary to acute Covid-19 viral pneumonia during the ongoing 2020 Covid 19 Pandemic - POA Diagnosed 05/31/2019.  Patient presents with progressive dyspnea, found to be hypoxic on room air.  0.83, fibrinogen 750, CRP 22.6.  Chest x-ray with progressive bibasilar interstitial and groundglass opacities consistent with multifocal pneumonia. --ddimer 1.83-->3.60-->1.65-->1.88-->1.32-->5.20-->9.28-->3.84 --CRP 22.6-->16.1-->12.4-->7.5-->3.9-->2.4-->2.8-->4.8 --completed 5 day course remdesivir on 06/08/2019 --Continue Solu-Medrol 60 mg every 12 hours --Continue supplemental oxygen, titrate to maintain SPO2 greater than 95%; currently on 15 L heated high flow nasal cannula and 15L NRB --Continue supportive care with vitamin C, zinc, Tylenol, Dulera MDI --Follow CBC, CMP, D-dimer, ferritin, and CRP daily --Continue airborne/contact isolation precautions  EtOH abuse/withdrawl Patient endorses 1.5 bottles of wine daily.  Patient initially started to be tremulous which progressed  to agitation and combativeness. --Received 18 mg of  IV Ativan overnight for combativeness --CIWAA protocol w/ ativan and Precedex gtt --per PCCM; continue phenobarbital 130 mg IV daily to assist titration off of precedex gtt --started on seroquel 50mg  PO BID on 06/10/2019 --Continue nonviolent restraints with mittens/soft wrist restraints as he has been pulling at medical equipment; IV lines and pulling off oxygen devices mask/nasal cannula --Folic acid, thiamine, multivitamin --starting tube feeds today  Elevated D-dimer Acute right peroneal vein DVT D-dimer 1.32-->5.20-->9.21.  Vascular duplex ultrasound bilateral lower extremities remarkable for acute right peroneal DVT. --Continue treatment dose Lovenox; once can tolerate oral, will transition to NOAC  History of cardiac transplant Patient on mycophenolate 1000 mg p.o. twice daily, prednisone 5 mg p.o. daily, tacrolimus 1 mg p.o. twice daily at home. Follows with [facility] cardiology/transplant team. --Continue tacrolimus 1 mg p.o. BID --On high-dose steroids as above for Covid-19 pneumonia --Holding mycophenolate per transplant team, plan restart on 06/14/2019  Type 2 diabetes mellitus Hemoglobin A1c 9.0, not optimally controlled.  On Lantus 24 units subcutaneously daily, Humalog 15 units subcutaneously daily with lunch at home. --continue Levemir 35u [place] daily --Insulin sliding scale for further coverage --continuous CBG monitoring  Acute on chronic kidney disease stage III Baseline creatinine 1.6, creatinine 2.0 on admission --Cr 2.02-->1.29-->1.44-->1.30-->0.80-->1.03 --Continue IVF w/ D5 half-normal saline w/ 40KCL at 100 mL/hr until tube feeds at goal, then will discontinue --Continue to hold home lisinopril --Avoid nephrotoxins, renally dose all medications --Monitor renal function daily  Hypokalemia Hypomagnesemia Potassium 4.2 today. --Follow electrolytes daily to include magnesium  Essential hypertension On lisinopril 5  mg p.o. daily, propranolol 10 mg p.o. twice daily at home.  Patient was hypotensive on presentation. --BP 172/64 today --Continue propranolol 10 mg p.o. twice daily  --Continue to hold home lisinopril for now --Hydralazine 10mg  IV q6h prn for SBP >170 or DBP >110 --Monitor blood pressure closely  Weakness, debility: --OT currently recommends SNF with PT recommending home health, given his current mental status, will continue therapy while inpatient and await final recommendations when more ready for discharge.   DVT prophylaxis: Heparin Code Status: DNR - per PCCM conversation with spouse [name] on 06/10/2019 Family Communication: Attempted to update patient spouse, [name] via telephone, unsuccessful this afternoon. Disposition Plan: From home with spouse, to be determined, grim/guarded prognosis, continues on high FiO2 with heated high flow nasal cannula and NRB, also with active EtOH withdrawals, unsafe for discharge at this time; continue ICU level of care on Precedex drip.   Consultants:   ID -  Dr. Megan Salon - signed off 06/07/2019  PCCM - Dr. Vaughan Browner, Dr. Lake Bells  Procedures:   TTE 06/05/2019 IMPRESSIONS    1. Left ventricular ejection fraction, by visual estimation, is 65 to  70%. The left ventricle has hyperdynamic function. There is no left  ventricular hypertrophy.  2. Left ventricular diastolic parameters are consistent with Grade I  diastolic dysfunction (impaired relaxation).  3. The left ventricle has no regional wall motion abnormalities.  4. Global right ventricle has normal systolic function.The right  ventricular size is normal. No increase in right ventricular wall  thickness.  5. Left atrial size was mildly dilated.  6. Right atrial size was normal.  7. The mitral valve is normal in structure. No evidence of mitral valve  regurgitation.  8. The tricuspid valve is grossly normal.  9. The tricuspid valve is grossly normal. Tricuspid valve regurgitation  is  not demonstrated.  10. The aortic valve is grossly normal. Aortic valve regurgitation is not  visualized.  11. The  pulmonic valve was grossly normal. Pulmonic valve regurgitation is  not visualized.  12. The inferior vena cava is normal in size with greater than 50%  respiratory variability, suggesting right atrial pressure of 3 mmHg.  13. The interatrial septum was not wll visualized.  14. Interpretation limited due to poor sound wave transmission. LVEF is  normal. LV appears underfilled. Valves not seen well but no obvious  vegetations noted.limited due to poor sound wave trans,mis   Vascular Ultrasound duplex bilateral lower extremities 06/09/2019 Summary:  RIGHT:  Findings consistent with acute deep vein thrombosis involving the right  peroneal veins.    LEFT:  There is no evidence of deep vein thrombosis in the lower extremity.    COVID Treatment:   Remdesivir completed 5 day course on 06/08/2019  solumedrol   Subjective:  Patient seen and examined bedside; nonresponsive to verbal stimuli.  Has received a total of 18mg  IV Ativan overnight.  Continues on Precedex drip, phenobarbital.  Continues with high FiO2 requirements on 15 L high flow nasal cannula and 15L NRB.  Treatment dose Lovenox for acute right lower extremity DVT.  Unable to obtain any further ROS from patient given his current mental status. No other acute events overnight per nursing staff.  Objective: Vitals:   06/10/19 0600 06/10/19 0700 06/10/19 0731 06/10/19 0800  BP: (!) 160/51 (!) 172/64 (!) 178/74 (!) 154/65  Pulse: 73 78 69 78  Resp: (!) 22 (!) 21 19 20   Temp:   97.8 F (36.6 C)   TempSrc:   Axillary   SpO2: 94% (!) 81% 93% (!) 88%  Weight:      Height:        Intake/Output Summary (Last 24 hours) at 06/10/2019 1326 Last data filed at 06/10/2019 0700 Gross per 24 hour  Intake 2325.9 ml  Output 825 ml  Net 1500.9 ml   Filed Weights   06/08/19 0500 06/09/19 0442 06/10/19 0459  Weight: 90.5 kg  86.2 kg 90.2 kg    Examination:  General exam: Unresponsive to verbal stimuli, obtunded Respiratory system: Clear to auscultation. Respiratory effort normal.  On 15 L heated high flow nasal cannula with 15 L NRB with SPO2 95% Cardiovascular system: S1 & S2 heard, RRR. No JVD, murmurs, rubs, gallops or clicks. No pedal edema. Gastrointestinal system: Abdomen is nondistended, soft and nontender. No organomegaly or masses felt. Normal bowel sounds heard. Central nervous system:  obtunded; withdraws to painful stimuli Extremities: Moves all extremities independently Skin: No rashes, lesions or ulcers Psychiatry: Unable to assess secondary to current mental status.    Data Reviewed: I have personally reviewed following labs and imaging studies  CBC: Recent Labs  Lab 06/19/2019 2257 06/20/2019 2257 06/04/19 0444 06/04/19 0444 06/05/19 0205 06/06/19 0225 06/07/19 0137 06/09/19 0440 06/10/19 0620  WBC 6.7   < > 4.5   < > 5.0 8.8 5.7 4.7 6.2  NEUTROABS 5.6  --  3.7  --   --   --   --   --   --   HGB 17.1*   < > 13.2   < > 13.5 14.3 12.8* 13.4 13.1  HCT 52.2*   < > 39.2   < > 40.5 42.9 38.5* 41.5 41.0  MCV 93.2   < > 92.9   < > 91.8 91.5 93.7 93.5 93.0  PLT 162   < > 107*   < > 134* 234 180 150 184   < > = values in this interval not displayed.  Basic Metabolic Panel: Recent Labs  Lab 06/06/19 0225 06/07/19 0137 06/08/19 0429 06/09/19 0440 06/10/19 0620  NA 142 144 146* 148* 146*  K 3.3* 3.6 3.4* 3.2* 4.2  CL 102 109 107 109 110  CO2 25 26 23 28 26   GLUCOSE 113* 108* 197* 177* 86  BUN 55* 60* 62* 42* 39*  CREATININE 1.29* 1.44* 1.30* 0.80 1.03  CALCIUM 8.4* 8.0* 7.3* 6.9* 6.7*  MG  --   --   --  1.4* 2.2  PHOS  --   --   --   --  3.4   GFR: Estimated Creatinine Clearance: 66.7 mL/min (by C-G formula based on SCr of 1.03 mg/dL). Liver Function Tests: Recent Labs  Lab 06/27/2019 2257 06/04/19 0444 06/09/19 0440 06/10/19 0620  AST 27 19 20 26   ALT 23 19 23 28     ALKPHOS 66 48 85 93  BILITOT 1.4* 0.7 0.6 0.6  PROT 7.6 5.6* 5.6* 5.5*  ALBUMIN 4.2 3.0* 2.8* 2.4*   No results for input(s): LIPASE, AMYLASE in the last 168 hours. No results for input(s): AMMONIA in the last 168 hours. Coagulation Profile: Recent Labs  Lab 06/28/2019 2257  INR 1.1   Cardiac Enzymes: No results for input(s): CKTOTAL, CKMB, CKMBINDEX, TROPONINI in the last 168 hours. BNP (last 3 results) No results for input(s): PROBNP in the last 8760 hours. HbA1C: No results for input(s): HGBA1C in the last 72 hours. CBG: Recent Labs  Lab 06/09/19 0740 06/09/19 1132 06/09/19 1643 06/09/19 2130 06/10/19 1226  GLUCAP 180* 204* 180* 104* 125*   Lipid Profile: No results for input(s): CHOL, HDL, LDLCALC, TRIG, CHOLHDL, LDLDIRECT in the last 72 hours. Thyroid Function Tests: No results for input(s): TSH, T4TOTAL, FREET4, T3FREE, THYROIDAB in the last 72 hours. Anemia Panel: Recent Labs    06/09/19 0440 06/10/19 0620  FERRITIN 542* 659*   Sepsis Labs: Recent Labs  Lab 06/29/2019 2257 06/26/2019 2257 06/04/19 0110 06/04/19 0444 06/04/19 0706 06/04/19 1155  PROCALCITON 0.19  --   --  0.24  --  0.24  LATICACIDVEN 4.6*   < > 2.7* 1.7 2.6* 1.6   < > = values in this interval not displayed.    Recent Results (from the past 240 hour(s))  Culture, blood (Routine x 2)     Status: Abnormal   Collection Time: 06/30/2019 10:57 PM   Specimen: BLOOD  Result Value Ref Range Status   Specimen Description BLOOD LEFT ARM  Final   Special Requests   Final    BOTTLES DRAWN AEROBIC AND ANAEROBIC Blood Culture adequate volume   Culture  Setup Time   Final    GRAM POSITIVE COCCI IN PAIRS IN BOTH AEROBIC AND ANAEROBIC BOTTLES CRITICAL RESULT CALLED TO, READ BACK BY AND VERIFIED WITH: [place] [id number] 06/04/19 A BROWNING Performed at [facility], [place] [address], [place], [place] [address]    Culture STREPTOCOCCUS MITIS/ORALIS (A)  Final   Report Status 06/06/2019 FINAL   Final   Organism ID, Bacteria STREPTOCOCCUS MITIS/ORALIS  Final      Susceptibility   Streptococcus mitis/oralis - MIC*    TETRACYCLINE 4 SENSITIVE Sensitive     VANCOMYCIN 0.5 SENSITIVE Sensitive     CLINDAMYCIN >=1 RESISTANT Resistant     PENICILLIN Value in next row Sensitive      SENSITIVE<=0.06    CEFTRIAXONE Value in next row Sensitive      SENSITIVE<=0.12    * STREPTOCOCCUS MITIS/ORALIS  Urine culture  Status: Abnormal   Collection Time: 06/11/2019 11:38 PM   Specimen: Urine, Clean Catch  Result Value Ref Range Status   Specimen Description URINE, CLEAN CATCH  Final   Special Requests   Final    NONE Performed at [facility], [address] [place], [place] [address]    Culture MULTIPLE SPECIES PRESENT, SUGGEST RECOLLECTION (A)  Final   Report Status 06/04/2019 FINAL  Final  Culture, blood (Routine x 2)     Status: Abnormal   Collection Time: 06/04/19  1:10 AM   Specimen: BLOOD RIGHT FOREARM  Result Value Ref Range Status   Specimen Description BLOOD RIGHT FOREARM  Final   Special Requests   Final    BOTTLES DRAWN AEROBIC AND ANAEROBIC Blood Culture adequate volume   Culture  Setup Time   Final    ANAEROBIC BOTTLE ONLY GRAM POSITIVE COCCI CRITICAL RESULT CALLED TO, READ BACK BY AND VERIFIED WITH: Deborra Medina [facility] 06/04/19 2254 JDW Performed at [facility], [place] [address], [place], [place] [address]    Culture STAPHYLOCOCCUS EPIDERMIDIS (A)  Final   Report Status 06/06/2019 FINAL  Final   Organism ID, Bacteria STAPHYLOCOCCUS EPIDERMIDIS  Final      Susceptibility   Staphylococcus epidermidis - MIC*    CIPROFLOXACIN <=0.5 SENSITIVE Sensitive     ERYTHROMYCIN <=0.25 SENSITIVE Sensitive     GENTAMICIN <=0.5 SENSITIVE Sensitive     OXACILLIN <=0.25 SENSITIVE Sensitive     TETRACYCLINE >=16 RESISTANT Resistant     VANCOMYCIN 1 SENSITIVE Sensitive     TRIMETH/SULFA <=10 SENSITIVE Sensitive     CLINDAMYCIN <=0.25 SENSITIVE Sensitive     RIFAMPIN <=0.5  SENSITIVE Sensitive     Inducible Clindamycin NEGATIVE Sensitive     * STAPHYLOCOCCUS EPIDERMIDIS  Blood Culture ID Panel (Reflexed)     Status: Abnormal   Collection Time: 06/04/19  1:10 AM  Result Value Ref Range Status   Enterococcus species NOT DETECTED NOT DETECTED Final   Listeria monocytogenes NOT DETECTED NOT DETECTED Final   Staphylococcus species DETECTED (A) NOT DETECTED Final    Comment: Methicillin (oxacillin) susceptible coagulase negative staphylococcus. Possible blood culture contaminant (unless isolated from more than one blood culture draw or clinical case suggests pathogenicity). No antibiotic treatment is indicated for blood  culture contaminants. CRITICAL RESULT CALLED TO, READ BACK BY AND VERIFIED WITH: T MEYER PHARMD 06/04/19 2254 JDW    Staphylococcus aureus (BCID) NOT DETECTED NOT DETECTED Final   Methicillin resistance NOT DETECTED NOT DETECTED Final   Streptococcus species NOT DETECTED NOT DETECTED Final   Streptococcus agalactiae NOT DETECTED NOT DETECTED Final   Streptococcus pneumoniae NOT DETECTED NOT DETECTED Final   Streptococcus pyogenes NOT DETECTED NOT DETECTED Final   Acinetobacter baumannii NOT DETECTED NOT DETECTED Final   Enterobacteriaceae species NOT DETECTED NOT DETECTED Final   Enterobacter cloacae complex NOT DETECTED NOT DETECTED Final   Escherichia coli NOT DETECTED NOT DETECTED Final   Klebsiella oxytoca NOT DETECTED NOT DETECTED Final   Klebsiella pneumoniae NOT DETECTED NOT DETECTED Final   Proteus species NOT DETECTED NOT DETECTED Final   Serratia marcescens NOT DETECTED NOT DETECTED Final   Haemophilus influenzae NOT DETECTED NOT DETECTED Final   Neisseria meningitidis NOT DETECTED NOT DETECTED Final   Pseudomonas aeruginosa NOT DETECTED NOT DETECTED Final   Candida albicans NOT DETECTED NOT DETECTED Final   Candida glabrata NOT DETECTED NOT DETECTED Final   Candida krusei NOT DETECTED NOT DETECTED Final   Candida parapsilosis  NOT  DETECTED NOT DETECTED Final   Candida tropicalis NOT DETECTED NOT DETECTED Final    Comment: Performed at [facility], [place] [address], [place], [place] [address]  MRSA PCR Screening     Status: None   Collection Time: 06/04/19  8:35 AM   Specimen: Nasopharyngeal  Result Value Ref Range Status   MRSA by PCR NEGATIVE NEGATIVE Final    Comment:        The GeneXpert MRSA Assay (FDA approved for NASAL specimens only), is one component of a comprehensive MRSA colonization surveillance program. It is not intended to diagnose MRSA infection nor to guide or monitor treatment for MRSA infections. Performed at [facility], [place] [address], [place], [place] [address]   Culture, blood (routine x 2)     Status: None (Preliminary result)   Collection Time: 06/05/19  3:00 PM   Specimen: BLOOD  Result Value Ref Range Status   Specimen Description   Final    BLOOD LEFT HAND Performed at [facility] [address] [place], [place] [address]    Special Requests   Final    BOTTLES DRAWN AEROBIC ONLY Blood Culture results may not be optimal due to an inadequate volume of blood received in culture bottles Performed at [facility] [address] [place], [place] [address]    Culture   Final    NO GROWTH 4 DAYS Performed at [facility], [place] [address] [place], [place] [address]    Report Status PENDING  Incomplete  Culture, blood (routine x 2)     Status: None (Preliminary result)   Collection Time: 06/05/19  3:06 PM   Specimen: BLOOD  Result Value Ref Range Status   Specimen Description   Final    BLOOD RIGHT HAND Performed at [facility] [address], [place], [place] [address]    Special Requests   Final    BOTTLES DRAWN AEROBIC ONLY Blood Culture adequate volume Performed at [facility] [address] [place], [place] [address]    Culture   Final    NO GROWTH 4 DAYS Performed at  [facility], [place] [address] [place], [place] [address]    Report Status PENDING  Incomplete  MRSA PCR Screening     Status: None   Collection Time: 06/07/19  6:48 PM   Specimen: Nasal Mucosa; Nasopharyngeal  Result Value Ref Range Status   MRSA by PCR NEGATIVE NEGATIVE Final    Comment:        The GeneXpert MRSA Assay (FDA approved for NASAL specimens only), is one component of a comprehensive MRSA colonization surveillance program. It is not intended to diagnose MRSA infection nor to guide or monitor treatment for MRSA infections. Performed at [facility], [place] [address] [place],  [address]          Radiology Studies: VAS [place] LOWER EXTREMITY VENOUS (DVT)  Result Date: 06/10/2019  Lower Venous DVT Study Indications: Covid-19, elevated D-Dimer.  Limitations: Ventilation. Comparison Study: No prior study on file for comparison Performing Technologist: Sharion Dove RVS  Examination Guidelines: A complete evaluation includes B-mode imaging, spectral Doppler, color Doppler, and power Doppler as needed of all accessible portions of each vessel. Bilateral testing is considered an integral part of a complete examination. Limited examinations for reoccurring indications may be performed as noted. The reflux portion of the exam is performed with the patient in reverse  Trendelenburg.  +---------+---------------+---------+-----------+----------+--------------+ RIGHT    CompressibilityPhasicitySpontaneityPropertiesThrombus Aging +---------+---------------+---------+-----------+----------+--------------+ CFV      Full           Yes      Yes                                 +---------+---------------+---------+-----------+----------+--------------+ SFJ      Full                                                        +---------+---------------+---------+-----------+----------+--------------+ FV Prox  Full                                                         +---------+---------------+---------+-----------+----------+--------------+ FV Mid   Full                                                        +---------+---------------+---------+-----------+----------+--------------+ FV DistalFull                                                        +---------+---------------+---------+-----------+----------+--------------+ PFV      Full                                                        +---------+---------------+---------+-----------+----------+--------------+ POP      Full           Yes      Yes                                 +---------+---------------+---------+-----------+----------+--------------+ PTV      Full                                                        +---------+---------------+---------+-----------+----------+--------------+ PERO     None                                         Acute          +---------+---------------+---------+-----------+----------+--------------+   +---------+---------------+---------+-----------+----------+--------------+ LEFT     CompressibilityPhasicitySpontaneityPropertiesThrombus Aging +---------+---------------+---------+-----------+----------+--------------+ CFV      Full           Yes      Yes                                 +---------+---------------+---------+-----------+----------+--------------+  SFJ      Full                                                        +---------+---------------+---------+-----------+----------+--------------+ FV Prox  Full                                                        +---------+---------------+---------+-----------+----------+--------------+ FV Mid   Full                                                        +---------+---------------+---------+-----------+----------+--------------+ FV DistalFull                                                         +---------+---------------+---------+-----------+----------+--------------+ PFV      Full                                                        +---------+---------------+---------+-----------+----------+--------------+ POP      Full           Yes      Yes                                 +---------+---------------+---------+-----------+----------+--------------+ PTV      Full                                                        +---------+---------------+---------+-----------+----------+--------------+ PERO     Full                                                        +---------+---------------+---------+-----------+----------+--------------+     Summary: RIGHT: - Findings consistent with acute deep vein thrombosis involving the right peroneal veins.  LEFT: - There is no evidence of deep vein thrombosis in the lower extremity.  *See table(s) above for measurements and observations. Electronically signed by Monica Martinez MD on 06/10/2019 at 9:21:38 AM.    Final         Scheduled Meds: . allopurinol  300 mg Oral Daily  . vitamin C  500 mg Oral Daily  . chlorhexidine  15 mL Mouth Rinse BID  . Chlorhexidine Gluconate Cloth  6 each Topical Daily  . clopidogrel  75 mg Oral QHS  .  enoxaparin (LOVENOX) injection  1 mg/kg Subcutaneous Q12H  . feeding supplement (PRO-STAT SUGAR FREE 64)  30 mL Per Tube BID  . feeding supplement (VITAL HIGH PROTEIN)  1,000 mL Per Tube Q24H  . folic acid  1 mg Intravenous Daily   Or  . folic acid  1 mg Oral Daily  . insulin aspart  0-15 Units Subcutaneous TID WC  . insulin aspart  0-5 Units Subcutaneous QHS  . insulin detemir  35 Units Subcutaneous Daily  . Ipratropium-Albuterol  1 puff Inhalation Q6H  . latanoprost  1 drop Both Eyes QHS  . mouth rinse  15 mL Mouth Rinse q12n4p  . methylPREDNISolone (SOLU-MEDROL) injection  60 mg Intravenous Q12H  . mometasone-formoterol  2 puff Inhalation BID  . multivitamin with minerals  1 tablet  Oral Daily  . PHENObarbital  130 mg Intravenous Daily  . potassium chloride  40 mEq Per Tube Once  . pravastatin  20 mg Oral QHS  . propranolol  10 mg Oral BID AC  . QUEtiapine  50 mg Oral BID  . sodium chloride flush  10-40 mL Intracatheter Q12H  . thiamine  100 mg Oral Daily   Or  . thiamine  100 mg Intravenous Daily  . zinc sulfate  220 mg Oral Daily   Continuous Infusions: . cefTRIAXone (ROCEPHIN)  IV Stopped (06/09/19 1756)  . dexmedetomidine (PRECEDEX) IV infusion 0.9 mcg/kg/hr (06/10/19 0700)  . dextrose 5 % and 0.45 % NaCl with KCl 40 mEq/L 100 mL/hr at 06/10/19 0700  . tacrolimus (PROGRAF) IV 0.008 mg/kg/day (06/10/19 0700)     LOS: 6 days    Critical Care Time Upon my evaluation, this patient had a high probability of imminent or life-threatening deterioration due to severe hypoxic respiratory failure secondary to Covid-19 viral pneumonia, EtOH withdrawals, which required my direct attention, intervention, and personal management.  I have personally provided 37 minutes of critical care time exclusive of my time spent on separately billable procedures.  Time includes review of laboratory data, radiology results, discussion with consultants, and monitoring for potential decompensation.       Yassen Kinnett J [place] DO [facility] Available via Epic secure chat 7am-7pm After these hours, please refer to coverage provider listed on [email or web address] 06/10/2019, 1:26 PM

## 2019-06-10 NOTE — Progress Notes (Signed)
[facility]  I called and spoke with the patient's wife [name].  She said that she also has COVID and is at home coughing a lot.  She is drinking a lot of fluids. She says that he has made it clear in the past that he does not want to be intubated in the event of a cardiac or respiratory arrest.  She says that he has been very vocal about not wanting extreme measure taken in the midst of an acute illness.  Based on this conversation I have changed his code status to DNR.  She says to make him comfortable in the event of worsening respiratory status.  Roselie Awkward, MD [place] [facility] Pager: [phone] Cell: [phone] If no response, call [phone]

## 2019-06-10 NOTE — Progress Notes (Signed)
eLink Physician-Brief Progress Note Patient Name: Brandon [name] DOB: 19-Jan-1941 MRN: [id number]   Date of Service  06/10/2019  HPI/Events of Note  RN requests change in PT's blood sugar monitoring to Q 4 hours due to hyperglycemia.  eICU Interventions  Blood sugar monitoring changed to Q 4 hours with coverage.        [name] 06/10/2019, 11:43 PM

## 2019-06-10 NOTE — Progress Notes (Signed)
Talked to patient's wife [name].  Updated her about the patient's status.  Answered her questions about treatments and potential course of infection and hospital stay.  Emphasized that treatment and interventions needed, and duration of symptoms can vary based on the individual.

## 2019-06-10 NOTE — Progress Notes (Signed)
1100: Spouse updated over phone, all questions answered  1300: DNR purple band applied to wrist per MD code status change.   1400: 3 RNs attempted to place NGT without success. Pt became very agitated, hypertensive and hypoxic. Will allow Pt to rest and try again later this shift. EKG completed per order.  1600: 2 RNs attempted NGT placement again. Pt began moaning and thrashing despite sedation, again unable to place tube.

## 2019-06-11 LAB — COMPREHENSIVE METABOLIC PANEL WITH GFR
ALT: 33 U/L (ref 0–44)
AST: 27 U/L (ref 15–41)
Albumin: 2.4 g/dL — ABNORMAL LOW (ref 3.5–5.0)
Alkaline Phosphatase: 122 U/L (ref 38–126)
Anion gap: 13 (ref 5–15)
BUN: 45 mg/dL — ABNORMAL HIGH (ref 8–23)
CO2: 26 mmol/L (ref 22–32)
Calcium: 6.6 mg/dL — ABNORMAL LOW (ref 8.9–10.3)
Chloride: 108 mmol/L (ref 98–111)
Creatinine, Ser: 1.09 mg/dL (ref 0.61–1.24)
GFR calc Af Amer: >60 mL/min (ref >60)
GFR calc non Af Amer: >60 mL/min (ref >60)
Glucose, Bld: 188 mg/dL — ABNORMAL HIGH (ref 70–99)
Potassium: 4.2 mmol/L (ref 3.5–5.1)
Sodium: 147 mmol/L — ABNORMAL HIGH (ref 135–145)
Total Bilirubin: 0.7 mg/dL (ref 0.3–1.2)
Total Protein: 5.7 g/dL — ABNORMAL LOW (ref 6.5–8.1)

## 2019-06-11 LAB — CBC
HCT: 42.5 % (ref 39.0–52.0)
Hemoglobin: 13.6 g/dL (ref 13.0–17.0)
MCH: 30 pg (ref 26.0–34.0)
MCHC: 32 g/dL (ref 30.0–36.0)
MCV: 93.8 fL (ref 80.0–100.0)
Platelets: 144 K/uL — ABNORMAL LOW (ref 150–400)
RBC: 4.53 MIL/uL (ref 4.22–5.81)
RDW: 12.8 % (ref 11.5–15.5)
WBC: 7.3 K/uL (ref 4.0–10.5)
nRBC: 0.3 % — ABNORMAL HIGH (ref 0.0–0.2)

## 2019-06-11 LAB — GLUCOSE, CAPILLARY
Glucose-Capillary: 102 mg/dL — ABNORMAL HIGH (ref 70–99)
Glucose-Capillary: 51 mg/dL — ABNORMAL LOW (ref 70–99)
Glucose-Capillary: 191 mg/dL — ABNORMAL HIGH (ref 70–99)
Glucose-Capillary: 145 mg/dL — ABNORMAL HIGH (ref 70–99)
Glucose-Capillary: 56 mg/dL — ABNORMAL LOW (ref 70–99)

## 2019-06-11 LAB — C-REACTIVE PROTEIN: CRP: 7.1 mg/dL — ABNORMAL HIGH (ref <1.0)

## 2019-06-11 LAB — MAGNESIUM: Magnesium: 1.6 mg/dL — ABNORMAL LOW (ref 1.7–2.4)

## 2019-06-11 LAB — FERRITIN: Ferritin: 875 ng/mL — ABNORMAL HIGH (ref 24–336)

## 2019-06-11 LAB — PHOSPHORUS: Phosphorus: 4.1 mg/dL (ref 2.5–4.6)

## 2019-06-11 LAB — D-DIMER, QUANTITATIVE: D-Dimer, Quant: 3.94 ug{FEU}/mL — ABNORMAL HIGH (ref 0.00–0.50)

## 2019-06-11 MED ORDER — DEXTROSE 50 % IV SOLN: 12.5000 g | INTRAVENOUS | Status: DC

## 2019-06-11 MED ORDER — DEXTROSE 50 % IV SOLN: 25.0000 g | INTRAVENOUS | Status: AC

## 2019-06-11 MED ORDER — DEXTROSE IN LACTATED RINGERS 5 % IV SOLN: INTRAVENOUS | Status: DC

## 2019-06-11 MED ORDER — ENOXAPARIN SODIUM 100 MG/ML ~~LOC~~ SOLN
1.0000 mg/kg | Freq: Two times a day (BID) | SUBCUTANEOUS | Status: DC
  Administered 2019-06-11 – 2019-06-12 (×3): 85 mg via SUBCUTANEOUS
  Filled 2019-06-11 (×4): qty 1

## 2019-06-11 MED ORDER — DEXTROSE 50 % IV SOLN
INTRAVENOUS | Status: AC
  Administered 2019-06-11: 50 mL via INTRAVENOUS
  Filled 2019-06-11: qty 50

## 2019-06-11 MED ORDER — DEXTROSE 50 % IV SOLN
INTRAVENOUS | Status: AC
  Administered 2019-06-11: 20:00:00 25 mL via INTRAVENOUS
  Filled 2019-06-11: qty 50

## 2019-06-11 MED ORDER — MAGNESIUM SULFATE 2 GM/50ML IV SOLN
2.0000 g | Freq: Once | INTRAVENOUS | Status: AC
  Administered 2019-06-11: 2 g via INTRAVENOUS
  Filled 2019-06-11: qty 50

## 2019-06-11 MED ORDER — DEXTROSE 50 % IV SOLN: 25.0000 mL | Freq: Once | INTRAVENOUS | Status: AC

## 2019-06-11 NOTE — Progress Notes (Signed)
ANTICOAGULATION CONSULT NOTE - Follow Up Consult  Pharmacy Consult for Lovenox Indication: DVT  Allergies  Allergen Reactions  . Amoxicillin Swelling and Rash    Did it involve swelling of the face/tongue/throat, SOB, or low BP? No Did it involve sudden or severe rash/hives, skin peeling, or any reaction on the inside of your mouth or nose? No Did you need to seek medical attention at a hospital or doctor's office? Yes When did it last happen?<10 yrs If all above answers are "NO", may proceed with cephalosporin use.   Had to come to hospital after taking it.  . Codeine Nausea And Vomiting    Patient Measurements: Height: 5\' 9"  (175.3 cm) Weight: 186 lb 8.2 oz (84.6 kg) IBW/kg (Calculated) : 70.7 Heparin Dosing Weight:   Vital Signs: Temp: 97.2 F (36.2 C) (02/09 0800) Temp Source: Axillary (02/09 0800) BP: 144/64 (02/09 1102) Pulse Rate: 71 (02/09 1102)  Labs: Recent Labs    06/09/19 0440 06/09/19 0440 06/10/19 0620 06/11/19 0415  HGB 13.4   < > 13.1 13.6  HCT 41.5  --  41.0 42.5  PLT 150  --  184 144*  CREATININE 0.80  --  1.03 1.09   < > = values in this interval not displayed.    Estimated Creatinine Clearance: 56.8 mL/min (by C-G formula based on SCr of 1.09 mg/dL).   Medications:  Scheduled:  . allopurinol  300 mg Oral Daily  . vitamin C  500 mg Oral Daily  . chlorhexidine  15 mL Mouth Rinse BID  . Chlorhexidine Gluconate Cloth  6 each Topical Daily  . clopidogrel  75 mg Oral QHS  . enoxaparin (LOVENOX) injection  1 mg/kg Subcutaneous Q12H  . feeding supplement (PRO-STAT SUGAR FREE 64)  30 mL Per Tube BID  . feeding supplement (VITAL HIGH PROTEIN)  1,000 mL Per Tube Q24H  . folic acid  1 mg Intravenous Daily   Or  . folic acid  1 mg Oral Daily  . insulin aspart  0-15 Units Subcutaneous Q4H  . insulin detemir  35 Units Subcutaneous Daily  . latanoprost  1 drop Both Eyes QHS  . mouth rinse  15 mL Mouth Rinse q12n4p  . methylPREDNISolone  (SOLU-MEDROL) injection  60 mg Intravenous Q12H  . multivitamin with minerals  1 tablet Oral Daily  . PHENObarbital  130 mg Intravenous Daily  . potassium chloride  40 mEq Per Tube Once  . pravastatin  20 mg Oral QHS  . propranolol  10 mg Oral BID AC  . QUEtiapine  50 mg Oral BID  . sodium chloride flush  10-40 mL Intracatheter Q12H  . thiamine  100 mg Oral Daily   Or  . thiamine  100 mg Intravenous Daily  . zinc sulfate  220 mg Oral Daily   Infusions:  . dexmedetomidine (PRECEDEX) IV infusion 3 mcg/kg/hr (06/11/19 1145)  . tacrolimus (PROGRAF) IV 0.008 mg/kg/day (06/11/19 1000)    Assessment: 26 YOM with COVID-19 pneumonia now with rising D-dimer requiring 15L HFNC. Pharmacy consulted to increase lovenox to treatment dose. Vascular duplex ultrasound bilateral lower extremities remarkable for acute right peroneal DVT.  SCr stable 1.09, H/H stable, Plt decreased to 144k.  No bleeding or complications reported.   Goal of Therapy:  Anti-Xa level 0.6-1 units/ml 4hrs after LMWH dose given Monitor platelets by anticoagulation protocol: Yes   Plan:  Continue Lovenox 1 mg/kg (85 mg) [place] q12h. Monitor CBC, renal fx and s/s of bleeding   Gretta Arab PharmD, BCPS  Clinical pharmacist phone 7am- 5pm: [phone] 06/11/2019 12:06 PM

## 2019-06-11 NOTE — Progress Notes (Signed)
  RT attempted to wean pt off HFNC and only remain on NRB however spo2 dropped to 70's and pt became agitated. Pt placed back on HFNC at 10L and NRB at 15L . RN at bedside and aware

## 2019-06-11 NOTE — Progress Notes (Signed)
OT Cancellation Note  Patient Details Name: DEVINDRA SHOWS MRN: [id number] DOB: [birth year]   Cancelled Treatment:    Reason Eval/Treat Not Completed: Medical issues which prohibited therapy. Per RN, pt still on precedex and receiving ativan frequently due to agitation. Pt not appropriate for therapy today. OT will continue to follow acutely.   Mauri [place] 06/11/2019, 9:35 AM

## 2019-06-11 NOTE — Progress Notes (Signed)
PROGRESS NOTE    TIRRELL LUELLEN  [id number] DOB: 1940-09-12 DOA: 06/04/2019 PCP: Leanna Battles, MD    Brief Narrative:   DJIMON STUTES is a 79 y.o. male with history of cardiac transplant, diabetes mellitus type 2, hypertension, pacemaker, CVA, chronic kidney disease stage III was recently admitted after a fall at that time patient was diagnosed with COVID-19 infection was discharged home after physical therapy on June 01, 2019.  For the last 2 days per patient's wife patient has not been doing well not eating well and had some diarrhea and was increasingly confused.  Patient blood sugar was running low and EMS was called.  After patient was given some food blood sugar improved but since patient was getting more weak he was brought to the ER.  In the ER patient was hypotensive with lactic acid of 4.6 CRP 22.6 creatinine worsened to 1.9 from 1.6 sodium 134 procalcitonin 0.19 CBC largely unremarkable except for hemoglobin 17.1 D-dimer 1.83 INR 1.1 UA chest x-ray unremarkable.  CT head unremarkable. Patient is oriented to his name and place after patient was started on fluids and empiric antibiotics for sepsis.  Prior to that patient was confused.  Patient admitted for sepsis with Covid infection.  EKG shows sinus tachycardia. Patient's blood pressure improved with fluids and stress dose steroids.   Assessment & Plan:   Principal Problem:   COVID-19 Active Problems:   Sepsis ([place])   Heart transplanted ([place])   CAD (coronary artery disease)   Essential hypertension   Type 2 diabetes mellitus with stage 3 chronic kidney disease (HCC)   Encephalopathy   Acute kidney injury superimposed on chronic kidney disease ([place])   Positive blood culture   Acute metabolic encephalopathy Etiology likely multifactorial given staph aureus septicemia combined with hypoxic respiratory failure secondary to Covid-19 viral pneumonia.  CT head with chronic ischemic microangiopathy and old right  frontal lobe infarct without acute intracranial abnormality.  Continue treatment with ceftriaxone for septicemia and remdesivir/steroids for Covid-pneumonia as below.  Sepsis, present on admission Lactic acidosis Streptococcus mitis/oralis septicemia Blood cultures 2 out of 4 positive for GPC's.  Complicated by history of pacemaker placement.  Discussed with infectious disease, Dr. Megan Salon on 06/05/2019, and believes he could have a true bacteremia with methicillin sensitive coagulase-negative Staphylococcus. TTE 2/3 with EF [id number], grade 1 diastolic dysfunction, LA mildly dilated, normal IVC, valves not well seen but no obvious vegetations.  Lactic acid improved from 4.6-1.6 with IV fluid hydration and antibiotics.  Infectious disease was consulted and recommended to complete 7-day course of ceftriaxone, which was finished on 06/10/2019.  Repeat blood cultures from 06/05/2019, remain negative to date.  Acute hypoxic respiratory failure secondary to acute Covid-19 viral pneumonia during the ongoing 2020 Covid 19 Pandemic - POA Diagnosed 05/31/2019.  Patient presents with progressive dyspnea, found to be hypoxic on room air.  0.83, fibrinogen 750, CRP 22.6.  Chest x-ray with progressive bibasilar interstitial and groundglass opacities consistent with multifocal pneumonia. --ddimer 1.83-->3.60-->1.65-->1.88-->1.32-->5.20-->9.28-->3.84-->3.94 --CRP 22.6-->16.1-->12.4-->7.5-->3.9-->2.4-->2.8-->4.8-->7.1 --completed 5 day course remdesivir on 06/08/2019 --Continue Solu-Medrol 60 mg every 12 hours --Continue supplemental oxygen, titrate to maintain SPO2 greater than 95%; currently on 15 L heated high flow nasal cannula and 15L NRB --Continue supportive care with vitamin C, zinc, Tylenol, Dulera MDI --Follow CBC, CMP, D-dimer, ferritin, and CRP daily --Continue airborne/contact isolation precautions  Acute metabolic encephalopathy EtOH abuse/withdrawl Patient endorses 1.5 bottles of wine daily.  Patient  initially started to be tremulous which progressed to agitation and combativeness. --Received  21 mg of IV Ativan past 24 hours for combativeness --CIWAA protocol w/ ativan and Precedex gtt --per PCCM; continue phenobarbital 130 mg IV daily to assist titration off of precedex gtt --started on seroquel 50mg  PO BID on 06/10/2019 --Continue nonviolent restraints with mittens/soft wrist restraints as he has been pulling at medical equipment; IV lines and pulling off oxygen devices mask/nasal cannula --Folic acid, thiamine, multivitamin --Unable to place NG tube last night; pending Cortrack placement  Elevated D-dimer Acute right peroneal vein DVT D-dimer 1.32-->5.20-->9.21-->3.94.  Vascular duplex ultrasound bilateral lower extremities remarkable for acute right peroneal DVT. --Continue treatment dose Lovenox; once can tolerate oral, will transition to NOAC  History of cardiac transplant Patient on mycophenolate 1000 mg p.o. twice daily, prednisone 5 mg p.o. daily, tacrolimus 1 mg p.o. twice daily at home. Follows with [facility] cardiology/transplant team. --Continue tacrolimus 1 mg p.o. BID; now transitioned to IV per pharmacy given encephalopathy --On high-dose steroids as above for Covid-19 pneumonia --Holding mycophenolate per transplant team, plan restart on 06/14/2019  Type 2 diabetes mellitus Hemoglobin A1c 9.0, not optimally controlled.  On Lantus 24 units subcutaneously daily, Humalog 15 units subcutaneously daily with lunch at home. --continue Levemir 35u [place] daily --Insulin sliding scale for further coverage --continuous CBG monitoring  Acute on chronic kidney disease stage III Baseline creatinine 1.6, creatinine 2.0 on admission --Cr 2.02-->1.29-->1.44-->1.30-->0.80-->1.03-->1.09 --Continue IVF w/ D5 half-normal saline w/ 40KCL at 100 mL/hr until tube feeds at goal, then will discontinue --Continue to hold home lisinopril --Avoid nephrotoxins, renally dose all medications --Monitor  renal function daily  Hypokalemia Hypomagnesemia Potassium 4.2 today. --Follow electrolytes daily to include magnesium  Essential hypertension On lisinopril 5 mg p.o. daily, propranolol 10 mg p.o. twice daily at home.  Patient was hypotensive on presentation. --BP 162/69 today --Continue propranolol 10 mg p.o. twice daily when NGT placed --Continue to hold home lisinopril for now --Hydralazine 10mg  IV q6h prn for SBP >170 or DBP >110 --Monitor blood pressure closely  Weakness, debility: --OT currently recommends SNF with PT recommending home health, given his current mental status, will continue therapy while inpatient and await final recommendations when more ready for discharge.   DVT prophylaxis: Heparin Code Status: DNR - per PCCM conversation with spouse [name] on 06/10/2019 Family Communication: updated patient spouse, [name] via telephone this morning. Disposition Plan: From home with spouse, to be determined, grim/guarded prognosis, continues on high FiO2 with heated high flow nasal cannula and NRB, also with active EtOH withdrawals, unsafe for discharge at this time; continue ICU level of care on Precedex drip.   Consultants:   ID -  Dr. Megan Salon - signed off 06/07/2019  PCCM - Dr. Vaughan Browner, Dr. Lake Bells  Procedures:   TTE 06/05/2019 IMPRESSIONS    1. Left ventricular ejection fraction, by visual estimation, is 65 to  70%. The left ventricle has hyperdynamic function. There is no left  ventricular hypertrophy.  2. Left ventricular diastolic parameters are consistent with Grade I  diastolic dysfunction (impaired relaxation).  3. The left ventricle has no regional wall motion abnormalities.  4. Global right ventricle has normal systolic function.The right  ventricular size is normal. No increase in right ventricular wall  thickness.  5. Left atrial size was mildly dilated.  6. Right atrial size was normal.  7. The mitral valve is normal in structure. No evidence of  mitral valve  regurgitation.  8. The tricuspid valve is grossly normal.  9. The tricuspid valve is grossly normal. Tricuspid valve regurgitation  is not demonstrated.  10. The aortic valve is grossly normal. Aortic valve regurgitation is not  visualized.  11. The pulmonic valve was grossly normal. Pulmonic valve regurgitation is  not visualized.  12. The inferior vena cava is normal in size with greater than 50%  respiratory variability, suggesting right atrial pressure of 3 mmHg.  13. The interatrial septum was not wll visualized.  14. Interpretation limited due to poor sound wave transmission. LVEF is  normal. LV appears underfilled. Valves not seen well but no obvious  vegetations noted.limited due to poor sound wave trans,mis   Vascular Ultrasound duplex bilateral lower extremities 06/09/2019 Summary:  RIGHT:  Findings consistent with acute deep vein thrombosis involving the right  peroneal veins.    LEFT:  There is no evidence of deep vein thrombosis in the lower extremity.    COVID Treatment:   Remdesivir completed 5 day course on 06/08/2019  solumedrol   Subjective:  Patient seen and examined bedside; nonresponsive to verbal stimuli.  Has received a total of 21mg  IV Ativan past 24h.  Continues on Precedex drip, phenobarbital.  Continues with high FiO2 requirements on 15 L high flow nasal cannula and 15L NRB.  Treatment dose Lovenox for acute right lower extremity DVT.  Unable to obtain any further ROS from patient given his current mental status. No other acute events overnight per nursing staff. Updated patients spouse this am.  Objective: Vitals:   06/11/19 0800 06/11/19 0830 06/11/19 0900 06/11/19 1000  BP: (!) 139/55  (!) 159/67 (!) 148/66  Pulse: 74 73 71 73  Resp: (!) 28 18 18  (!) 23  Temp: (!) 97.2 F (36.2 C)     TempSrc: Axillary     SpO2: 96%  99% 97%  Weight:      Height:        Intake/Output Summary (Last 24 hours) at 06/11/2019 1047 Last data filed  at 06/11/2019 1000 Gross per 24 hour  Intake 2439.22 ml  Output 2100 ml  Net 339.22 ml   Filed Weights   06/09/19 0442 06/10/19 0459 06/11/19 0500  Weight: 86.2 kg 90.2 kg 84.6 kg    Examination:  General exam: Unresponsive to verbal stimuli, obtunded Respiratory system: Clear to auscultation. Respiratory effort normal.  On 15 L high flow nasal cannula with 15 L NRB with SPO2 95% Cardiovascular system: S1 & S2 heard, RRR. No JVD, murmurs, rubs, gallops or clicks. No pedal edema. Gastrointestinal system: Abdomen is nondistended, soft and nontender. No organomegaly or masses felt. Normal bowel sounds heard. Central nervous system:  obtunded; withdraws to painful stimuli Extremities: Moves all extremities independently Skin: No rashes, lesions or ulcers Psychiatry: Unable to assess secondary to current mental status.    Data Reviewed: I have personally reviewed following labs and imaging studies  CBC: Recent Labs  Lab 06/06/19 0225 06/07/19 0137 06/09/19 0440 06/10/19 0620 06/11/19 0415  WBC 8.8 5.7 4.7 6.2 7.3  HGB 14.3 12.8* 13.4 13.1 13.6  HCT 42.9 38.5* 41.5 41.0 42.5  MCV 91.5 93.7 93.5 93.0 93.8  PLT 234 180 150 184 [id number]*   Basic Metabolic Panel: Recent Labs  Lab 06/07/19 0137 06/08/19 0429 06/09/19 0440 06/10/19 0620 06/10/19 2050 06/11/19 0415  NA 144 146* 148* 146*  --  147*  K 3.6 3.4* 3.2* 4.2  --  4.2  CL 109 107 109 110  --  108  CO2 26 23 28 26   --  26  GLUCOSE 108* 197* 177* 86  --  188*  BUN 60* 62* 42*  39*  --  45*  CREATININE 1.44* 1.30* 0.80 1.03  --  1.09  CALCIUM 8.0* 7.3* 6.9* 6.7*  --  6.6*  MG  --   --  1.4* 2.2 1.8 1.6*  PHOS  --   --   --  3.4 4.7* 4.1   GFR: Estimated Creatinine Clearance: 56.8 mL/min (by C-G formula based on SCr of 1.09 mg/dL). Liver Function Tests: Recent Labs  Lab 06/09/19 0440 06/10/19 0620 06/11/19 0415  AST 20 26 27   ALT 23 28 33  ALKPHOS 85 93 122  BILITOT 0.6 0.6 0.7  PROT 5.6* 5.5* 5.7*  ALBUMIN  2.8* 2.4* 2.4*   No results for input(s): LIPASE, AMYLASE in the last 168 hours. No results for input(s): AMMONIA in the last 168 hours. Coagulation Profile: No results for input(s): INR, PROTIME in the last 168 hours. Cardiac Enzymes: No results for input(s): CKTOTAL, CKMB, CKMBINDEX, TROPONINI in the last 168 hours. BNP (last 3 results) No results for input(s): PROBNP in the last 8760 hours. HbA1C: No results for input(s): HGBA1C in the last 72 hours. CBG: Recent Labs  Lab 06/09/19 1643 06/09/19 2130 06/10/19 1226 06/10/19 1559 06/10/19 1940  GLUCAP 180* 104* 125* 193* 288*   Lipid Profile: No results for input(s): CHOL, HDL, LDLCALC, TRIG, CHOLHDL, LDLDIRECT in the last 72 hours. Thyroid Function Tests: No results for input(s): TSH, T4TOTAL, FREET4, T3FREE, THYROIDAB in the last 72 hours. Anemia Panel: Recent Labs    06/10/19 0620 06/11/19 0415  FERRITIN 659* 875*   Sepsis Labs: Recent Labs  Lab 06/04/19 1155  PROCALCITON 0.24  LATICACIDVEN 1.6    Recent Results (from the past 240 hour(s))  Culture, blood (Routine x 2)     Status: Abnormal   Collection Time: 06/15/2019 10:57 PM   Specimen: BLOOD  Result Value Ref Range Status   Specimen Description BLOOD LEFT ARM  Final   Special Requests   Final    BOTTLES DRAWN AEROBIC AND ANAEROBIC Blood Culture adequate volume   Culture  Setup Time   Final    GRAM POSITIVE COCCI IN PAIRS IN BOTH AEROBIC AND ANAEROBIC BOTTLES CRITICAL RESULT CALLED TO, READ BACK BY AND VERIFIED WITH: [place] [id number] 06/04/19 A BROWNING Performed at [facility], [place] [address] [place], [place] [address]    Culture STREPTOCOCCUS MITIS/ORALIS (A)  Final   Report Status 06/06/2019 FINAL  Final   Organism ID, Bacteria STREPTOCOCCUS MITIS/ORALIS  Final      Susceptibility   Streptococcus mitis/oralis - MIC*    TETRACYCLINE 4 SENSITIVE Sensitive     VANCOMYCIN 0.5 SENSITIVE Sensitive     CLINDAMYCIN >=1 RESISTANT Resistant      PENICILLIN Value in next row Sensitive      SENSITIVE<=0.06    CEFTRIAXONE Value in next row Sensitive      SENSITIVE<=0.12    * STREPTOCOCCUS MITIS/ORALIS  Urine culture     Status: Abnormal   Collection Time: 06/25/2019 11:38 PM   Specimen: Urine, Clean Catch  Result Value Ref Range Status   Specimen Description URINE, CLEAN CATCH  Final   Special Requests   Final    NONE Performed at [facility], [place] [address] [place], [place] [address]    Culture MULTIPLE SPECIES PRESENT, SUGGEST RECOLLECTION (A)  Final   Report Status 06/04/2019 FINAL  Final  Culture, blood (Routine x 2)     Status: Abnormal   Collection Time: 06/04/19  1:10 AM   Specimen:  BLOOD RIGHT FOREARM  Result Value Ref Range Status   Specimen Description BLOOD RIGHT FOREARM  Final   Special Requests   Final    BOTTLES DRAWN AEROBIC AND ANAEROBIC Blood Culture adequate volume   Culture  Setup Time   Final    ANAEROBIC BOTTLE ONLY GRAM POSITIVE COCCI CRITICAL RESULT CALLED TO, READ BACK BY AND VERIFIED WITH: Deborra Medina [facility] 06/04/19 2254 JDW Performed at [facility], [place] [address], [place], [place] [address]    Culture STAPHYLOCOCCUS EPIDERMIDIS (A)  Final   Report Status 06/06/2019 FINAL  Final   Organism ID, Bacteria STAPHYLOCOCCUS EPIDERMIDIS  Final      Susceptibility   Staphylococcus epidermidis - MIC*    CIPROFLOXACIN <=0.5 SENSITIVE Sensitive     ERYTHROMYCIN <=0.25 SENSITIVE Sensitive     GENTAMICIN <=0.5 SENSITIVE Sensitive     OXACILLIN <=0.25 SENSITIVE Sensitive     TETRACYCLINE >=16 RESISTANT Resistant     VANCOMYCIN 1 SENSITIVE Sensitive     TRIMETH/SULFA <=10 SENSITIVE Sensitive     CLINDAMYCIN <=0.25 SENSITIVE Sensitive     RIFAMPIN <=0.5 SENSITIVE Sensitive     Inducible Clindamycin NEGATIVE Sensitive     * STAPHYLOCOCCUS EPIDERMIDIS  Blood Culture ID Panel (Reflexed)     Status: Abnormal   Collection Time: 06/04/19  1:10 AM  Result Value Ref Range Status   Enterococcus species NOT  DETECTED NOT DETECTED Final   Listeria monocytogenes NOT DETECTED NOT DETECTED Final   Staphylococcus species DETECTED (A) NOT DETECTED Final    Comment: Methicillin (oxacillin) susceptible coagulase negative staphylococcus. Possible blood culture contaminant (unless isolated from more than one blood culture draw or clinical case suggests pathogenicity). No antibiotic treatment is indicated for blood  culture contaminants. CRITICAL RESULT CALLED TO, READ BACK BY AND VERIFIED WITH: T MEYER PHARMD 06/04/19 2254 JDW    Staphylococcus aureus (BCID) NOT DETECTED NOT DETECTED Final   Methicillin resistance NOT DETECTED NOT DETECTED Final   Streptococcus species NOT DETECTED NOT DETECTED Final   Streptococcus agalactiae NOT DETECTED NOT DETECTED Final   Streptococcus pneumoniae NOT DETECTED NOT DETECTED Final   Streptococcus pyogenes NOT DETECTED NOT DETECTED Final   Acinetobacter baumannii NOT DETECTED NOT DETECTED Final   Enterobacteriaceae species NOT DETECTED NOT DETECTED Final   Enterobacter cloacae complex NOT DETECTED NOT DETECTED Final   Escherichia coli NOT DETECTED NOT DETECTED Final   Klebsiella oxytoca NOT DETECTED NOT DETECTED Final   Klebsiella pneumoniae NOT DETECTED NOT DETECTED Final   Proteus species NOT DETECTED NOT DETECTED Final   Serratia marcescens NOT DETECTED NOT DETECTED Final   Haemophilus influenzae NOT DETECTED NOT DETECTED Final   Neisseria meningitidis NOT DETECTED NOT DETECTED Final   Pseudomonas aeruginosa NOT DETECTED NOT DETECTED Final   Candida albicans NOT DETECTED NOT DETECTED Final   Candida glabrata NOT DETECTED NOT DETECTED Final   Candida krusei NOT DETECTED NOT DETECTED Final   Candida parapsilosis NOT DETECTED NOT DETECTED Final   Candida tropicalis NOT DETECTED NOT DETECTED Final    Comment: Performed at [facility], [place]. [address] [place], [place] [address]  MRSA PCR Screening     Status: None   Collection Time: 06/04/19  8:35 AM    Specimen: Nasopharyngeal  Result Value Ref Range Status   MRSA by PCR NEGATIVE NEGATIVE Final    Comment:        The GeneXpert MRSA Assay (FDA approved for NASAL specimens only), is one component of a comprehensive MRSA colonization  surveillance program. It is not intended to diagnose MRSA infection nor to guide or monitor treatment for MRSA infections. Performed at [facility], [place] [address], [place], [place] [address]   Culture, blood (routine x 2)     Status: None (Preliminary result)   Collection Time: 06/05/19  3:00 PM   Specimen: BLOOD  Result Value Ref Range Status   Specimen Description   Final    BLOOD LEFT HAND Performed at [facility] [address], [place], [place] [address]    Special Requests   Final    BOTTLES DRAWN AEROBIC ONLY Blood Culture results may not be optimal due to an inadequate volume of blood received in culture bottles Performed at [place] [address] [place], [place] [address]    Culture   Final    NO GROWTH 4 DAYS Performed at [facility], [place] [address] [place], [place] [address]    Report Status PENDING  Incomplete  Culture, blood (routine x 2)     Status: None (Preliminary result)   Collection Time: 06/05/19  3:06 PM   Specimen: BLOOD  Result Value Ref Range Status   Specimen Description   Final    BLOOD RIGHT HAND Performed at [place] [address] [place], [place] [address]    Special Requests   Final    BOTTLES DRAWN AEROBIC ONLY Blood Culture adequate volume Performed at [place] [address], [place], [place] [address]    Culture   Final    NO GROWTH 4 DAYS Performed at [facility], [place] [address], [place],  [address]    Report Status PENDING  Incomplete  MRSA PCR Screening     Status: None   Collection Time: 06/07/19  6:48 PM   Specimen: Nasal Mucosa; Nasopharyngeal  Result Value Ref Range  Status   MRSA by PCR NEGATIVE NEGATIVE Final    Comment:        The GeneXpert MRSA Assay (FDA approved for NASAL specimens only), is one component of a comprehensive MRSA colonization surveillance program. It is not intended to diagnose MRSA infection nor to guide or monitor treatment for MRSA infections. Performed at [facility], [place] [address] [place],  [address]          Radiology Studies: VAS [place] LOWER EXTREMITY VENOUS (DVT)  Result Date: 06/10/2019  Lower Venous DVT Study Indications: Covid-19, elevated D-Dimer.  Limitations: Ventilation. Comparison Study: No prior study on file for comparison Performing Technologist: Sharion Dove RVS  Examination Guidelines: A complete evaluation includes B-mode imaging, spectral Doppler, color Doppler, and power Doppler as needed of all accessible portions of each vessel. Bilateral testing is considered an integral part of a complete examination. Limited examinations for reoccurring indications may be performed as noted. The reflux portion of the exam is performed with the patient in reverse Trendelenburg.  +---------+---------------+---------+-----------+----------+--------------+ RIGHT    CompressibilityPhasicitySpontaneityPropertiesThrombus Aging +---------+---------------+---------+-----------+----------+--------------+ CFV      Full           Yes      Yes                                 +---------+---------------+---------+-----------+----------+--------------+ SFJ      Full                                                        +---------+---------------+---------+-----------+----------+--------------+  FV Prox  Full                                                        +---------+---------------+---------+-----------+----------+--------------+ FV Mid   Full                                                         +---------+---------------+---------+-----------+----------+--------------+ FV DistalFull                                                        +---------+---------------+---------+-----------+----------+--------------+ PFV      Full                                                        +---------+---------------+---------+-----------+----------+--------------+ POP      Full           Yes      Yes                                 +---------+---------------+---------+-----------+----------+--------------+ PTV      Full                                                        +---------+---------------+---------+-----------+----------+--------------+ PERO     None                                         Acute          +---------+---------------+---------+-----------+----------+--------------+   +---------+---------------+---------+-----------+----------+--------------+ LEFT     CompressibilityPhasicitySpontaneityPropertiesThrombus Aging +---------+---------------+---------+-----------+----------+--------------+ CFV      Full           Yes      Yes                                 +---------+---------------+---------+-----------+----------+--------------+ SFJ      Full                                                        +---------+---------------+---------+-----------+----------+--------------+ FV Prox  Full                                                        +---------+---------------+---------+-----------+----------+--------------+  FV Mid   Full                                                        +---------+---------------+---------+-----------+----------+--------------+ FV DistalFull                                                        +---------+---------------+---------+-----------+----------+--------------+ PFV      Full                                                         +---------+---------------+---------+-----------+----------+--------------+ POP      Full           Yes      Yes                                 +---------+---------------+---------+-----------+----------+--------------+ PTV      Full                                                        +---------+---------------+---------+-----------+----------+--------------+ PERO     Full                                                        +---------+---------------+---------+-----------+----------+--------------+     Summary: RIGHT: - Findings consistent with acute deep vein thrombosis involving the right peroneal veins.  LEFT: - There is no evidence of deep vein thrombosis in the lower extremity.  *See table(s) above for measurements and observations. Electronically signed by Monica Martinez MD on 06/10/2019 at 9:21:38 AM.    Final         Scheduled Meds: . allopurinol  300 mg Oral Daily  . vitamin C  500 mg Oral Daily  . chlorhexidine  15 mL Mouth Rinse BID  . Chlorhexidine Gluconate Cloth  6 each Topical Daily  . clopidogrel  75 mg Oral QHS  . enoxaparin (LOVENOX) injection  1 mg/kg Subcutaneous Q12H  . feeding supplement (PRO-STAT SUGAR FREE 64)  30 mL Per Tube BID  . feeding supplement (VITAL HIGH PROTEIN)  1,000 mL Per Tube Q24H  . folic acid  1 mg Intravenous Daily   Or  . folic acid  1 mg Oral Daily  . insulin aspart  0-15 Units Subcutaneous Q4H  . insulin detemir  35 Units Subcutaneous Daily  . latanoprost  1 drop Both Eyes QHS  . mouth rinse  15 mL Mouth Rinse q12n4p  . methylPREDNISolone (SOLU-MEDROL) injection  60 mg Intravenous Q12H  . multivitamin with minerals  1 tablet Oral Daily  . PHENObarbital  130 mg Intravenous Daily  .  potassium chloride  40 mEq Per Tube Once  . pravastatin  20 mg Oral QHS  . propranolol  10 mg Oral BID AC  . QUEtiapine  50 mg Oral BID  . sodium chloride flush  10-40 mL Intracatheter Q12H  . thiamine  100 mg Oral Daily   Or  .  thiamine  100 mg Intravenous Daily  . zinc sulfate  220 mg Oral Daily   Continuous Infusions: . dexmedetomidine (PRECEDEX) IV infusion 2 mcg/kg/hr (06/11/19 1000)  . tacrolimus (PROGRAF) IV 0.008 mg/kg/day (06/11/19 1000)     LOS: 7 days    Critical Care Time Upon my evaluation, this patient had a high probability of imminent or life-threatening deterioration due to severe hypoxic respiratory failure secondary to Covid-19 viral pneumonia, EtOH withdrawals, which required my direct attention, intervention, and personal management.  I have personally provided 37 minutes of critical care time exclusive of my time spent on separately billable procedures.  Time includes review of laboratory data, radiology results, discussion with consultants, and monitoring for potential decompensation.       Chasity Outten J [place] DO [facility] Available via Epic secure chat 7am-7pm After these hours, please refer to coverage provider listed on [email or web address] 06/11/2019, 10:47 AM

## 2019-06-11 NOTE — Progress Notes (Signed)
Initial Nutrition Assessment RD working remotely.  DOCUMENTATION CODES:   Severe malnutrition in context of acute illness/injury  INTERVENTION:   When Cortrak feeding tube can be placed, begin TF:   Glucerna 1.2 at 35 ml/h, increase by 10 ml every 4 hours to goal rate of 85 ml/h (2040 ml per day)   Provides 2448 kcal, 122 gm protein, 1642 ml free water daily  NUTRITION DIAGNOSIS:   Severe Malnutrition related to acute illness(COVID-19) as evidenced by energy intake < or equal to 50% for > or equal to 5 days, percent weight loss(10% weight loss within one month).  GOAL:   Patient will meet greater than or equal to 90% of their needs  MONITOR:   TF tolerance, Labs, I & O's  REASON FOR ASSESSMENT:   Consult Enteral/tube feeding initiation and management  ASSESSMENT:   79 yo male admitted with increased confusion and weakness. Recent admission S/P fall at home; found to have COVID-19 and discharged home 1/30.  PMH includes heart transplant, DM-2, HTN, pacemaker, CVA, CKD-3.   Labs reviewed. Na 147 (H), Mag 1.6 (L) CBG's: 288-102-51  Medications reviewed and include vitamin C, zinc, folic acid, novolog, levemir, solu-medrol, MVI, KCl, thiamine, precedex.   In the past 1.5 weeks, patient has had 10% weight loss. Since admission, he has been on a heart healthy CHO modified diet, consuming 0-50% of meals. Suspect intake has been providing < 50% of estimated nutrition needs for the past week.   Patient meets criteria for severe malnutrition in the context of acute illness.   Patient is unable to take any PO's due to respiratory distress and decreased mental status. He is requiring 10-15 L oxygen via HFNC and NRB.   RN attempted to place a NG tube for enteral nutrition support, but was unsuccessful. Cortrak service will be available 2/10.  NUTRITION - FOCUSED PHYSICAL EXAM:  unable to complete  Diet Order:   Diet Order            Diet heart healthy/carb modified Room  service appropriate? Yes; Fluid consistency: Thin  Diet effective now              EDUCATION NEEDS:   Not appropriate for education at this time  Skin:  Skin Assessment: Reviewed RN Assessment  Last BM:  2/5  Height:   Ht Readings from Last 1 Encounters:  06/04/2019 5\' 9"  (1.753 m)    Weight:   Wt Readings from Last 1 Encounters:  06/11/19 84.6 kg    Ideal Body Weight:  72.7 kg  BMI:  Body mass index is 27.54 kg/m.  Estimated Nutritional Needs:   Kcal:  [id number]  Protein:  110-140 gm  Fluid:  >/= 2.3 L    Molli Barrows, RD, LDN, CNSC Contact information can be found in Amion.

## 2019-06-11 NOTE — Progress Notes (Signed)
Attempted to place NGT x2 without success.  Patient uncooperative/unable to follow instruction.  Increased agitation, o2 sats decreased to 80s, RR rate increased to the mid 30s.  Will defer further attempts at this time.

## 2019-06-11 NOTE — Progress Notes (Signed)
NAME:  Brandon [name], MRN:  [id number], DOB:  [birth year], LOS: 7 ADMISSION DATE:  06/11/2019, CONSULTATION DATE:  2/5 REFERRING MD:  [place] CHIEF COMPLAINT:  Encephalopathy, withdrawal   Brief History   79 y/o with an extensive past medical history including cardiac transplant and alcoholism admitted on 2/1 with COVID, strep bacteremia.  Moved to the ICU on 2/5 due to worsening agitation, started on precedex for likely alcohol withdrawal.    Past Medical History  DM2 HTN Pacemaker CKD Cardiac transplant Alcohol abuse  Significant Hospital Events   2/1-admit 2/4-transfer to ICU for Precedex  Consults:  PCCM  Procedures:    Significant Diagnostic Tests:  CT head 2/2-no acute abnormality, chronic stroke, ischemic microangiopathy  Micro Data:  Urine culture 1/29-E. coli Blood cultures 2/1-Streptococcus mitis/orals 2/2 bottles Blood cultures 2/2-staph epidermidis Blood culture 2/3 (4 bottles) > no growth to date  Antimicrobials:  Ceftriaxone Remdesivir Solu-Medrol  Interim history/subjective:  Moaning No change in respiratory status Still on high amounts of oxygen  Objective   Blood pressure (!) 162/69, pulse 75, temperature 99.5 F (37.5 C), temperature source Axillary, resp. rate 19, height 5\' 9"  (1.753 m), weight 84.6 kg, SpO2 98 %.    FiO2 (%):  [100 %] 100 %   Intake/Output Summary (Last 24 hours) at 06/11/2019 0724 Last data filed at 06/11/2019 0700 Gross per 24 hour  Intake 2610.4 ml  Output 2150 ml  Net 460.4 ml   Filed Weights   06/09/19 0442 06/10/19 0459 06/11/19 0500  Weight: 86.2 kg 90.2 kg 84.6 kg    Examination:  General:  Resting in bed HENT: NCAT OP clear PULM: Rhonchi bilaterally, normal effort CV: RRR, no mgr GI: BS+, soft, nontender MSK: normal bulk and tone Neuro: awake, alert, no distress, MAEW   Resolved Hospital Problem list     Assessment & Plan:  COVID 19 pneumonia causing severe acute hypoxemic respiratory  failure Continue high flow nasal canula + NRB mask Tolerate periods of hypoxemia, goal at rest is greater than 85% SaO2, with movement ideally above 75% Decision for intubation should be based on a change in mental status or physical evidence of ventilatory failure such as nasal flaring, accessory muscle use, paradoxical breathing Out of bed to chair as able Incentive spirometry is important, use every hour Prone positioning while in bed  Acute encephalopathy due to alcohol withdrawal: worse Continue seroquel Continue phenobarb Continue precedex/prn ativan  Bacteremia  Abx per TRH   History of cardiac transplant Prograf to continue Solumedrol to continue  Nutrition:  Place cor-trak when able so we can start tube feeding  Code status: DNR, see conversation documented on 2/8  Best practice:   Per TR[facility]  Labs   CBC: Recent Labs  Lab 06/06/19 0225 06/07/19 0137 06/09/19 0440 06/10/19 0620 06/11/19 0415  WBC 8.8 5.7 4.7 6.2 7.3  HGB 14.3 12.8* 13.4 13.1 13.6  HCT 42.9 38.5* 41.5 41.0 42.5  MCV 91.5 93.7 93.5 93.0 93.8  PLT 234 180 150 184 144*    Basic Metabolic Panel: Recent Labs  Lab 06/07/19 0137 06/08/19 0429 06/09/19 0440 06/10/19 0620 06/10/19 2050 06/11/19 0415  NA 144 146* 148* 146*  --  147*  K 3.6 3.4* 3.2* 4.2  --  4.2  CL 109 107 109 110  --  108  CO2 26 23 28 26   --  26  GLUCOSE 108* 197* 177* 86  --  188*  BUN 60* 62* 42* 39*  --  45*  CREATININE 1.44* 1.30* 0.80 1.03  --  1.09  CALCIUM 8.0* 7.3* 6.9* 6.7*  --  6.6*  MG  --   --  1.4* 2.2 1.8 1.6*  PHOS  --   --   --  3.4 4.7* 4.1   GFR: Estimated Creatinine Clearance: 56.8 mL/min (by C-G formula based on SCr of 1.09 mg/dL). Recent Labs  Lab 06/04/19 1155 06/05/19 0205 06/07/19 0137 06/09/19 0440 06/10/19 0620 06/11/19 0415  PROCALCITON 0.24  --   --   --   --   --   WBC  --    < > 5.7 4.7 6.2 7.3  LATICACIDVEN 1.6  --   --   --   --   --    < > = values in this interval not  displayed.    Liver Function Tests: Recent Labs  Lab 06/09/19 0440 06/10/19 0620 06/11/19 0415  AST 20 26 27   ALT 23 28 33  ALKPHOS 85 93 122  BILITOT 0.6 0.6 0.7  PROT 5.6* 5.5* 5.7*  ALBUMIN 2.8* 2.4* 2.4*   No results for input(s): LIPASE, AMYLASE in the last 168 hours. No results for input(s): AMMONIA in the last 168 hours.  ABG No results found for: PHART, PCO2ART, PO2ART, HCO3, TCO2, ACIDBASEDEF, O2SAT   Coagulation Profile: No results for input(s): INR, PROTIME in the last 168 hours.  Cardiac Enzymes: No results for input(s): CKTOTAL, CKMB, CKMBINDEX, TROPONINI in the last 168 hours.  HbA1C: Hgb A1c MFr Bld  Date/Time Value Ref Range Status  06/05/2019 02:05 AM 9.0 (H) 4.8 - 5.6 % Final    Comment:    (NOTE) Pre diabetes:          5.7%-6.4% Diabetes:              >6.4% Glycemic control for   <7.0% adults with diabetes   05/31/2019 02:23 AM 8.3 (H) 4.8 - 5.6 % Final    Comment:    (NOTE) Pre diabetes:          5.7%-6.4% Diabetes:              >6.4% Glycemic control for   <7.0% adults with diabetes     CBG: Recent Labs  Lab 06/09/19 1643 06/09/19 2130 06/10/19 1226 06/10/19 1559 06/10/19 1940  GLUCAP 180* 104* 125* 193* 288*     Critical care time: 32 minutes     Roselie Awkward, MD [place] [facility] Pager: [phone] Cell: [phone] If no response, call [phone]

## 2019-06-11 NOTE — Progress Notes (Signed)
PT Cancellation Note  Patient Details Name: Brandon [name] MRN: [id number] DOB: June 29, 1940   Cancelled Treatment:    Reason Eval/Treat Not Completed: Medical issues which prohibited therapy .  Messaged RN Brandon [name]) who stated pt still needed quite a bit of sedatives and not able to participate with PT/OT today.  PT will continue to follow for medical stability.  Thanks,  Verdene Lennert, PT, DPT  Acute Rehabilitation [phone] pager [phone] office  @ [facility]: [phone]    [name] 06/11/2019, 9:17 AM

## 2019-06-11 NOTE — Progress Notes (Signed)
eLink Physician-Brief Progress Note Patient Name: Brandon [name] DOB: 11-30-1940 MRN: [id number]   Date of Service  06/11/2019  HPI/Events of Note  BS 56 mg %, Pt NPO due to altered mental status, he got 35 units of Levemir this a.m. He is scheduled for Cortrack placement in a.m.  eICU Interventions  D 50 W 25 ml iv x 1, D 5 LR at 30 ml/ hour x 12 hours then discontinue.         Kerry Kass Jarad Barth 06/11/2019, 8:14 PM

## 2019-06-12 ENCOUNTER — Inpatient Hospital Stay (HOSPITAL_COMMUNITY): Payer: Medicare Other

## 2019-06-12 DIAGNOSIS — F10231 Alcohol dependence with withdrawal delirium: Secondary | ICD-10-CM

## 2019-06-12 DIAGNOSIS — E43 Unspecified severe protein-calorie malnutrition: Secondary | ICD-10-CM

## 2019-06-12 DIAGNOSIS — G9340 Encephalopathy, unspecified: Secondary | ICD-10-CM

## 2019-06-12 DIAGNOSIS — U071 COVID-19: Secondary | ICD-10-CM

## 2019-06-12 DIAGNOSIS — E1121 Type 2 diabetes mellitus with diabetic nephropathy: Secondary | ICD-10-CM

## 2019-06-12 DIAGNOSIS — J1282 Pneumonia due to coronavirus disease 2019: Secondary | ICD-10-CM

## 2019-06-12 DIAGNOSIS — N1831 Chronic kidney disease, stage 3a: Secondary | ICD-10-CM

## 2019-06-12 DIAGNOSIS — J9601 Acute respiratory failure with hypoxia: Secondary | ICD-10-CM

## 2019-06-12 DIAGNOSIS — Z794 Long term (current) use of insulin: Secondary | ICD-10-CM

## 2019-06-12 DIAGNOSIS — E870 Hyperosmolality and hypernatremia: Secondary | ICD-10-CM

## 2019-06-12 DIAGNOSIS — Z941 Heart transplant status: Secondary | ICD-10-CM

## 2019-06-12 DIAGNOSIS — D849 Immunodeficiency, unspecified: Secondary | ICD-10-CM

## 2019-06-12 LAB — GLUCOSE, CAPILLARY
Glucose-Capillary: 109 mg/dL — ABNORMAL HIGH (ref 70–99)
Glucose-Capillary: 120 mg/dL — ABNORMAL HIGH (ref 70–99)
Glucose-Capillary: 125 mg/dL — ABNORMAL HIGH (ref 70–99)
Glucose-Capillary: 138 mg/dL — ABNORMAL HIGH (ref 70–99)
Glucose-Capillary: 160 mg/dL — ABNORMAL HIGH (ref 70–99)
Glucose-Capillary: 172 mg/dL — ABNORMAL HIGH (ref 70–99)
Glucose-Capillary: 178 mg/dL — ABNORMAL HIGH (ref 70–99)
Glucose-Capillary: 192 mg/dL — ABNORMAL HIGH (ref 70–99)
Glucose-Capillary: 306 mg/dL — ABNORMAL HIGH (ref 70–99)
Glucose-Capillary: 48 mg/dL — ABNORMAL LOW (ref 70–99)
Glucose-Capillary: 77 mg/dL (ref 70–99)
Glucose-Capillary: 78 mg/dL (ref 70–99)
Glucose-Capillary: 79 mg/dL (ref 70–99)
Glucose-Capillary: 81 mg/dL (ref 70–99)
Glucose-Capillary: 91 mg/dL (ref 70–99)
Glucose-Capillary: 93 mg/dL (ref 70–99)

## 2019-06-12 LAB — COMPREHENSIVE METABOLIC PANEL
ALT: 34 U/L (ref 0–44)
AST: 28 U/L (ref 15–41)
Albumin: 2.3 g/dL — ABNORMAL LOW (ref 3.5–5.0)
Alkaline Phosphatase: 131 U/L — ABNORMAL HIGH (ref 38–126)
Anion gap: 11 (ref 5–15)
BUN: 37 mg/dL — ABNORMAL HIGH (ref 8–23)
CO2: 28 mmol/L (ref 22–32)
Calcium: 6.5 mg/dL — ABNORMAL LOW (ref 8.9–10.3)
Chloride: 108 mmol/L (ref 98–111)
Creatinine, Ser: 0.92 mg/dL (ref 0.61–1.24)
GFR calc Af Amer: >60 mL/min (ref >60)
GFR calc non Af Amer: >60 mL/min (ref >60)
Glucose, Bld: 115 mg/dL — ABNORMAL HIGH (ref 70–99)
Potassium: 3.7 mmol/L (ref 3.5–5.1)
Sodium: 147 mmol/L — ABNORMAL HIGH (ref 135–145)
Total Bilirubin: 0.7 mg/dL (ref 0.3–1.2)
Total Protein: 6.1 g/dL — ABNORMAL LOW (ref 6.5–8.1)

## 2019-06-12 LAB — FERRITIN: Ferritin: 845 ng/mL — ABNORMAL HIGH (ref 24–336)

## 2019-06-12 LAB — CBC
HCT: 43.9 % (ref 39.0–52.0)
Hemoglobin: 14 g/dL (ref 13.0–17.0)
MCH: 30.3 pg (ref 26.0–34.0)
MCHC: 31.9 g/dL (ref 30.0–36.0)
MCV: 95 fL (ref 80.0–100.0)
Platelets: 129 10*3/uL — ABNORMAL LOW (ref 150–400)
RBC: 4.62 MIL/uL (ref 4.22–5.81)
RDW: 13 % (ref 11.5–15.5)
WBC: 8.2 10*3/uL (ref 4.0–10.5)
nRBC: 0.2 % (ref 0.0–0.2)

## 2019-06-12 LAB — D-DIMER, QUANTITATIVE: D-Dimer, Quant: 3.94 ug/mL-FEU — ABNORMAL HIGH (ref 0.00–0.50)

## 2019-06-12 LAB — C-REACTIVE PROTEIN: CRP: 7.9 mg/dL — ABNORMAL HIGH (ref <1.0)

## 2019-06-12 MED ORDER — DEXTROSE 50 % IV SOLN
12.5000 g | INTRAVENOUS | Status: AC | PRN
  Administered 2019-06-12: 12.5 g via INTRAVENOUS
  Filled 2019-06-12: qty 50

## 2019-06-12 MED ORDER — ADULT MULTIVITAMIN LIQUID CH: 15.0000 mL | Freq: Every day | ORAL | Status: DC

## 2019-06-12 MED ORDER — QUETIAPINE FUMARATE 50 MG PO TABS
50.0000 mg | ORAL_TABLET | Freq: Two times a day (BID) | ORAL | Status: DC
  Administered 2019-06-12: 22:00:00 50 mg
  Filled 2019-06-12: qty 1

## 2019-06-12 MED ORDER — ASCORBIC ACID 500 MG PO TABS: 500.0000 mg | ORAL_TABLET | Freq: Every day | ORAL | Status: DC

## 2019-06-12 MED ORDER — ZINC SULFATE 220 (50 ZN) MG PO CAPS: 220.0000 mg | ORAL_CAPSULE | Freq: Every day | ORAL | Status: DC

## 2019-06-12 MED ORDER — GLUCERNA 1.2 CAL PO LIQD
1000.0000 mL | ORAL | Status: DC
  Administered 2019-06-12: 13:00:00 1000 mL
  Filled 2019-06-12 (×3): qty 1000

## 2019-06-12 MED ORDER — POTASSIUM CHLORIDE 20 MEQ/15ML (10%) PO SOLN
40.0000 meq | Freq: Once | ORAL | Status: AC
  Administered 2019-06-12: 14:00:00 40 meq
  Filled 2019-06-12: qty 30

## 2019-06-12 MED ORDER — ADULT MULTIVITAMIN W/MINERALS CH: 1.0000 | ORAL_TABLET | Freq: Every day | ORAL | Status: DC

## 2019-06-12 MED ORDER — TACROLIMUS 1 MG/ML ORAL SUSPENSION
1.0000 mg | Freq: Two times a day (BID) | ORAL | Status: DC
  Administered 2019-06-12: 1 mg
  Filled 2019-06-12 (×2): qty 1

## 2019-06-12 MED ORDER — MORPHINE 100MG IN NS 100ML (1MG/ML) PREMIX INFUSION
1.0000 mg/h | INTRAVENOUS | Status: DC
  Administered 2019-06-12: 1 mg/h via INTRAVENOUS
  Filled 2019-06-12: qty 100

## 2019-06-12 MED ORDER — FUROSEMIDE 10 MG/ML IJ SOLN
40.0000 mg | Freq: Once | INTRAMUSCULAR | Status: AC
  Administered 2019-06-12: 13:00:00 40 mg via INTRAVENOUS
  Filled 2019-06-12: qty 4

## 2019-06-12 MED ORDER — INSULIN DETEMIR 100 UNIT/ML ~~LOC~~ SOLN
25.0000 [IU] | Freq: Every day | SUBCUTANEOUS | Status: DC
  Filled 2019-06-12: qty 0.25

## 2019-06-12 MED ORDER — DEXTROSE 10 % IV SOLN
INTRAVENOUS | Status: DC
  Administered 2019-06-12: 50 mL/h via INTRAVENOUS

## 2019-06-12 MED ORDER — CLOPIDOGREL BISULFATE 75 MG PO TABS
75.0000 mg | ORAL_TABLET | Freq: Every day | ORAL | Status: DC
  Administered 2019-06-12: 22:00:00 75 mg
  Filled 2019-06-12: qty 1

## 2019-06-12 MED ORDER — MORPHINE SULFATE (PF) 2 MG/ML IV SOLN
2.0000 mg | INTRAVENOUS | Status: DC | PRN
  Administered 2019-06-12 (×3): 2 mg via INTRAVENOUS
  Filled 2019-06-12 (×3): qty 1

## 2019-06-12 MED ORDER — ALLOPURINOL 300 MG PO TABS
300.0000 mg | ORAL_TABLET | Freq: Every day | ORAL | Status: DC
  Filled 2019-06-12: qty 1

## 2019-06-12 MED ORDER — LORAZEPAM 2 MG/ML IJ SOLN
1.0000 mg | INTRAMUSCULAR | Status: DC | PRN
  Administered 2019-06-12: 1 mg via INTRAVENOUS
  Filled 2019-06-12: qty 1

## 2019-06-12 MED ORDER — DEXAMETHASONE SODIUM PHOSPHATE 10 MG/ML IJ SOLN
6.0000 mg | INTRAMUSCULAR | Status: DC
  Administered 2019-06-12: 22:00:00 6 mg via INTRAVENOUS
  Filled 2019-06-12: qty 1

## 2019-06-12 MED ORDER — INSULIN DETEMIR 100 UNIT/ML ~~LOC~~ SOLN
10.0000 [IU] | Freq: Every day | SUBCUTANEOUS | Status: DC
  Filled 2019-06-12: qty 0.1

## 2019-06-12 MED ORDER — LORAZEPAM 2 MG/ML IJ SOLN
0.5000 mg | INTRAMUSCULAR | Status: DC | PRN
  Administered 2019-06-12: 1 mg via INTRAVENOUS
  Filled 2019-06-12: qty 1

## 2019-06-12 MED ORDER — PROPRANOLOL HCL 10 MG PO TABS
10.0000 mg | ORAL_TABLET | Freq: Two times a day (BID) | ORAL | Status: DC
  Administered 2019-06-12: 10 mg
  Filled 2019-06-12 (×3): qty 1

## 2019-06-12 MED ORDER — HALOPERIDOL LACTATE 5 MG/ML IJ SOLN
1.0000 mg | INTRAMUSCULAR | Status: DC | PRN
  Administered 2019-06-12: 21:00:00 1 mg via INTRAMUSCULAR
  Filled 2019-06-12: qty 1

## 2019-06-12 NOTE — Progress Notes (Signed)
I spoke with Mr. Timlin wife about patient's condition, prognosis and plan of care.  We tqlked about his severe encephalopathy and acute hypoxic respiratory failure related to SARS COVID-19 infection.  We will slowly wean off sedation and assess patient's neurologic status.  Will use as needed anxiolytics, analgesics and antipsychotics.  If patient continued to deteriorate despite aggressive medical therapy she agrees with continue care under comfort measures.  This will depend on patient's response to the plan of care and daily assessments.

## 2019-06-12 NOTE — Progress Notes (Signed)
PT Cancellation Note  Patient Details Name: DAUNTE MOHAMMAD MRN: [id number] DOB: October 17, 1940   Cancelled Treatment:    Reason Eval/Treat Not Completed: Medical issues which prohibited therapy, remains sedated for agitation. Continue to check on.   Claretha Cooper 06/12/2019, 1:18 PM  [place]  Office [phone]

## 2019-06-12 NOTE — Progress Notes (Signed)
Patient persistently hypoglycemic during my shift; Dr. Lucile Shutters ordered D5 & LR at 22ml/hr; patient given 1 amp of Dextrose so far, and D5LR increased to 67ml/hr. Patient started to become agitated again, and CBG was trending back down at 77. Dr. Lucile Shutters was notified and waiting for new orders.

## 2019-06-12 NOTE — Progress Notes (Signed)
eLink Physician-Brief Progress Note Patient Name: Brandon [name] DOB: 10-23-1940 MRN: [id number]   Date of Service  06/12/2019  HPI/Events of Note  Pt continues to have hypoglycemic episodes  eICU Interventions  D 5 W order discontinued, D 10 W 50 ml/ hour until discontinued. PRN D 22 W order entered as well.        [name] U [name] 06/12/2019, 1:21 AM

## 2019-06-12 NOTE — Progress Notes (Signed)
NAME:  Brandon [name], MRN:  [id number], DOB:  Feb 12, 1941, LOS: 8 ADMISSION DATE:  06/21/2019, CONSULTATION DATE:  2/5 REFERRING MD:  [place] CHIEF COMPLAINT:  Encephalopathy, withdrawal   Brief History   79 y/o with an extensive past medical history including cardiac transplant and alcoholism admitted on 2/1 with COVID, strep bacteremia.  Moved to the ICU on 2/5 due to worsening agitation, started on precedex for likely alcohol withdrawal.    Past Medical History  DM2 HTN Pacemaker CKD Cardiac transplant Alcohol abuse  Significant Hospital Events   2/1-admit 2/4-transfer to ICU for Precedex  Consults:  PCCM ID  Procedures:    Significant Diagnostic Tests:  CT head 2/2-no acute abnormality, chronic stroke, ischemic microangiopathy  Micro Data:  Urine culture 1/29-E. coli Blood cultures 2/1-Streptococcus mitis/orals 2/2 bottles Blood cultures 2/2-staph epidermidis Blood culture 2/3 (4 bottles) > no growth to date  Antimicrobials:  Ceftriaxone Remdesivir Solu-Medrol  Interim history/subjective:  Continues to have high oxygen requirements. Still agitated when off sedation per nurses.  Objective   Blood pressure (!) 136/92, pulse 69, temperature 98.7 F (37.1 C), resp. rate 15, height 5\' 9"  (1.753 m), weight 84.6 kg, SpO2 93 %.    FiO2 (%):  [100 %] 100 %   Intake/Output Summary (Last 24 hours) at 06/12/2019 1533 Last data filed at 06/12/2019 1430 Gross per 24 hour  Intake 1510.24 ml  Output 2250 ml  Net -739.76 ml   Filed Weights   06/09/19 0442 06/10/19 0459 06/11/19 0500  Weight: 86.2 kg 90.2 kg 84.6 kg    Examination:  General: Lying comfortably in bed, sedated on Precedex HENT: [place]/AT, eyes anicteric PULM: Rales bilaterally, breathing comfortably on NRB CV: Regular rate and rhythm, no murmurs GI: Soft, nontender, nondistended MSK: No significant lower extremity edema, mild upper extremity edema.  No clubbing. Neuro: Sedated on Precedex, not  responsive to stimulation during exam.   Resolved Hospital Problem list     Assessment & Plan:  COVID 19 pneumonia causing severe acute hypoxemic respiratory failure -Supplemental oxygen as required to maintain SPO2 greater than 85% appropriate work of breathing.  Titrate down as able -Not a candidate for awake proning given needs for sedating medications -IS end up in chair when awake  Acute encephalopathy due to alcohol withdrawal: reportedly worse, but heavily sedated this morning -Continue Seroquel -Continue Precedex infusion-titrating down today -Continue CIWA Ativan  Bacteremia -GPC -Previously completed a week of ceftriaxone  History of cardiac transplant -Continue tacrolimus and Solu-Medrol -Previous discussions with [facility] transplant indicated plans to restart mycophenolate on 2/12  Hypernatremia -Enteral free water -Continue to monitor  Diabetes with hyperglycemia and hypoglycemia -Core track placed to allow enteral nutrition -DC D10 drip when able -Insulin dose decreased-discussed with pharmacy  Nutrition:  -Cortrak placed today  Code status: DNR, see conversation documented on 2/8  Best practice:   Per primary  Labs   CBC: Recent Labs  Lab 06/07/19 0137 06/09/19 0440 06/10/19 0620 06/11/19 0415 06/12/19 0600  WBC 5.7 4.7 6.2 7.3 8.2  HGB 12.8* 13.4 13.1 13.6 14.0  HCT 38.5* 41.5 41.0 42.5 43.9  MCV 93.7 93.5 93.0 93.8 95.0  PLT 180 150 184 144* 129*    Basic Metabolic Panel: Recent Labs  Lab 06/08/19 0429 06/09/19 0440 06/10/19 0620 06/10/19 2050 06/11/19 0415 06/12/19 0600  NA 146* 148* 146*  --  147* 147*  K 3.4* 3.2* 4.2  --  4.2 3.7  CL 107 109 110  --  108 108  CO2 23 28 26   --  26 28  GLUCOSE 197* 177* 86  --  188* 115*  BUN 62* 42* 39*  --  45* 37*  CREATININE 1.30* 0.80 1.03  --  1.09 0.92  CALCIUM 7.3* 6.9* 6.7*  --  6.6* 6.5*  MG  --  1.4* 2.2 1.8 1.6*  --   PHOS  --   --  3.4 4.7* 4.1  --    GFR: Estimated Creatinine  Clearance: 67.2 mL/min (by C-G formula based on SCr of 0.92 mg/dL). Recent Labs  Lab 06/09/19 0440 06/10/19 0620 06/11/19 0415 06/12/19 0600  WBC 4.7 6.2 7.3 8.2    Liver Function Tests: Recent Labs  Lab 06/09/19 0440 06/10/19 0620 06/11/19 0415 06/12/19 0600  AST 20 26 27 28   ALT 23 28 33 34  ALKPHOS 85 93 122 131*  BILITOT 0.6 0.6 0.7 0.7  PROT 5.6* 5.5* 5.7* 6.1*  ALBUMIN 2.8* 2.4* 2.4* 2.3*   No results for input(s): LIPASE, AMYLASE in the last 168 hours. No results for input(s): AMMONIA in the last 168 hours.  ABG No results found for: PHART, PCO2ART, PO2ART, HCO3, TCO2, ACIDBASEDEF, O2SAT   Coagulation Profile: No results for input(s): INR, PROTIME in the last 168 hours.  Cardiac Enzymes: No results for input(s): CKTOTAL, CKMB, CKMBINDEX, TROPONINI in the last 168 hours.  HbA1C: Hgb A1c MFr Bld  Date/Time Value Ref Range Status  06/05/2019 02:05 AM 9.0 (H) 4.8 - 5.6 % Final    Comment:    (NOTE) Pre diabetes:          5.7%-6.4% Diabetes:              >6.4% Glycemic control for   <7.0% adults with diabetes   05/31/2019 02:23 AM 8.3 (H) 4.8 - 5.6 % Final    Comment:    (NOTE) Pre diabetes:          5.7%-6.4% Diabetes:              >6.4% Glycemic control for   <7.0% adults with diabetes     CBG: Recent Labs  Lab 06/12/19 0201 06/12/19 0241 06/12/19 0514 06/12/19 0740 06/12/19 1138  GLUCAP 78 138* 120* 109* 160*     This patient is critically ill with multiple organ system failure which requires frequent high complexity decision making, assessment, support, evaluation, and titration of therapies. This was completed through the application of advanced monitoring technologies and extensive interpretation of multiple databases. During this encounter critical care time was devoted to patient care services described in this note for 33 minutes.  Julian Hy, DO 06/12/19 3:49 PM [facility]

## 2019-06-12 NOTE — Progress Notes (Signed)
OT Cancellation Note  Patient Details Name: Brandon [name] MRN: [id number] DOB: 1941-02-10   Cancelled Treatment:    Reason Eval/Treat Not Completed: Patient not medically ready;Patient's level of consciousness. Pt is sedated due to agitation. Will assess once more appropriate.   Arrayah Connors,HILLARY 06/12/2019, 11:52 AM  Maurie Boettcher, OT/L   Acute OT Clinical Specialist Acute Rehabilitation Services Pager [phone] Office [phone]

## 2019-06-12 NOTE — Progress Notes (Signed)
Nutrition Follow-up RD working remotely.  DOCUMENTATION CODES:   Severe malnutrition in context of acute illness/injury  INTERVENTION:   Begin TF via Cortrak tube:   Glucerna 1.2 at 35 ml/h, increase by 10 ml every 4 hours to goal rate of 85 ml/h (2040 ml per day)   Provides 2448 kcal, 122 gm protein, 1642 ml free water daily  NUTRITION DIAGNOSIS:   Severe Malnutrition related to acute illness(COVID-19) as evidenced by energy intake < or equal to 50% for > or equal to 5 days, percent weight loss(10% weight loss within one month).  Ongoing  GOAL:   Patient will meet greater than or equal to 90% of their needs  Progressing  MONITOR:   TF tolerance, Labs, I & O's  REASON FOR ASSESSMENT:   Consult Enteral/tube feeding initiation and management  ASSESSMENT:   79 yo male admitted with increased confusion and weakness. Recent admission S/P fall at home; found to have COVID-19 and discharged home 1/30.  PMH includes heart transplant, DM-2, HTN, pacemaker, CVA, CKD-3.   Labs reviewed. Na 147 (H) CBG's: 78-138-120-109  Medications reviewed and include vitamin C, zinc, folic acid, novolog, levemir, solu-medrol, MVI, KCl, thiamine, precedex.   Patient remains unable to take PO's due to respiratory distress and decreased mental status. He is requiring 10-15 L oxygen via HFNC and NRB.   Cortrak was placed this morning, tip is postpyloric.   NUTRITION - FOCUSED PHYSICAL EXAM:  unable to complete  Diet Order:   Diet Order            Diet NPO time specified  Diet effective now              EDUCATION NEEDS:   Not appropriate for education at this time  Skin:  Skin Assessment: Reviewed RN Assessment  Last BM:  2/5  Height:   Ht Readings from Last 1 Encounters:  06/08/2019 5\' 9"  (1.753 m)    Weight:   Wt Readings from Last 1 Encounters:  06/11/19 84.6 kg    Ideal Body Weight:  72.7 kg  BMI:  Body mass index is 27.54 kg/m.  Estimated Nutritional  Needs:   Kcal:  [id number]  Protein:  110-140 gm  Fluid:  >/= 2.3 L    Molli Barrows, RD, LDN, CNSC Contact information can be found in Amion.

## 2019-06-12 NOTE — Progress Notes (Signed)
eLink Physician-Brief Progress Note Patient Name: [name] DOB: 1940/07/04 MRN: [id number]   Date of Service  06/12/2019  HPI/Events of Note  Patient with advanced Covid pneumonia complicated by bacterial sepsis, he is declining with saturation in the low 70's, he is DNAR and per conversation with his wife her priority is to keep him comfortable.  eICU Interventions  Will order comfort care measures, and have the [facility] set up a camera visit so that patient can say good bye.        Kerry Kass Kendarrius Tanzi 06/12/2019, 9:14 PM

## 2019-06-12 NOTE — Procedures (Signed)
Cortrak  Person Inserting Tube:  Maylon Peppers C, RD Tube Type:  Cortrak - 43 inches Tube Location:  Right nare Initial Placement:  Postpyloric Secured by: Bridle Technique Used to Measure Tube Placement:  Documented cm marking at nare/ corner of mouth Cortrak Secured At:  81 cm    Cortrak Tube Team Note:  Consult received to place a Cortrak feeding tube.   No x-ray is required. RN may begin using tube.   If the tube becomes dislodged please keep the tube and contact the Cortrak team at [email or web address] ([email or web address]) for replacement.  If after hours and replacement cannot be delayed, place a NG tube and confirm placement with an abdominal x-ray.    Lockie Pares., RD, LDN, CNSC See AMiON for contact information

## 2019-06-12 NOTE — Progress Notes (Addendum)
PROGRESS NOTE    Brandon [name]  [id number] DOB: 1941/02/26 DOA: 06/17/2019 PCP: Leanna Battles, MD    Brief Narrative:   Patient was admitted to the hospital with a working diagnosis of acute hypoxic respiratory failure due to SARS COVID-19 viral pneumonia.  Prolonged hospitalization complicated by Streptococcus bacteremia with sepsis (present on admission), end-organ damage metabolic encephalopathy and alcohol withdrawal syndrome.   79 year old male who presented with hypoglycemia, confusion and weakness.  Patient does have a significant past medical history for sp cardiac transplant, type 2 diabetes mellitus, hypertension, chronic kidney disease stage III, history of CVA and status post pacemaker.  Recent hospitalization (01/28 to 01/30) for a fall related COVID-19 and discharged home.  For the last 2 days prior to current hospitalization patient was noted to be increasingly confused positive diarrhea and hypoglycemia.  EMS was called and patient was brought to the hospital.  On his initial physical examination his blood pressure was 95/73, pulse rate 87, respiratory rate 17, temperature 97.9, oxygen saturation 90% on supplemental oxygen.  His lungs had no rhonchi or rales, heart S1-S2, present, abdomen was soft, no lower extremity edema, patient was oriented x2, moving all 4 extremities. Sodium 134, potassium 4.2, chloride 98, bicarb 22, glucose 88, BUN 44, creatinine 1.94.  White count 6.7, hemoglobin 17.1, hematocrit 52.2, platelets 162.  Urinalysis specific gravity 1.018, 100 protein.  His chest radiograph had left upper lobe atelectasis, loss of lung volume left lower lobe.  EKG 116 bpm, left axis deviation, positive PACs, sinus rhythm, no ST segment changes, negative T waves V1 to V3, positive baseline artifact.   Has been treated with supplemental oxygen per high flow nasal cannula, systemic corticosteroids, remdesivir and antibiotic therapy. He was placed on alcohol withdrawal  protocol.  02/04 He required dexmedetomidine infusion in the intensive care unit for severe withdrawal symptoms.  Patient with persistent high oxygen requirements per high flow nasal cannula and non rebreather mask.   Assessment & Plan:   Principal Problem:   COVID-19 Active Problems:   Sepsis ([place])   Heart transplanted ([place])   CAD (coronary artery disease)   Essential hypertension   Type 2 diabetes mellitus with stage 3 chronic kidney disease ([place])   Encephalopathy   Acute kidney injury superimposed on chronic kidney disease ([place])   Positive blood culture   Protein-calorie malnutrition, severe   1. Acute hypoxic respiratory failure due to SARS COVID 19 viral pneumonia. Sp #5/5 remdesivir,   RR: 15 to 17  Pulse oxymetry: 80 to 90 % Fi02: 15 L/ min per HFNC and NRBM  COVID-19 Labs  Recent Labs    06/10/19 0620 06/11/19 0415 06/12/19 0600  DDIMER 3.84* 3.94* 3.94*  FERRITIN 659* 875* 845*  CRP 4.8* 7.1* 7.9*    No results found for: [place]  Patient with persistent elevation in inflammatory markers. High oxygen requirements. Follow chest film with worsening diffuse bilateral interstitial infiltrates.   Not cooperative for airway clearing techniques or prone position due to encephalopathy and risk of aspiration. Will continue with systemic corticosteroids for now 6 mg dexamethasone daily.   Will do trail of diuresis for non cardiogenic pulmonary edema, continue to target 02 saturation more than 88%. Aspiration precautions and head elevated 45 degrees at all times.   Patient with very poor prognosis, considering persistent hypoxemia and encephalopathy. High risk for worsening respiratory failure.   2. Acute metabolic encephalopathy. Patient poorly responsive, no eye opening,no motor or verbal response to pain stimuli.He is on continuosu infusion of  dexmedetomidine and bolus phenobarbital. He has received 8 mg of lorazepam since this am.   Patient very sedated, will  change lorazepam to 1 mg q 4 H as needed and add as needed haldol. Will discontinue phenobarbital for now due to oversedation and risk of aspiration. Will dc trazodone and will add olanzapine in case of agitation at night. Add morphine for analgesia as needed.   Continue with Seroquel.   3. Streptococcus bacteremia/ complicated with sepsis, end organ failure encephalopathy. Echocardiogram with no vegetation. Patient completed 8 days of ceftriaxone. Follow up cultures continue with no growth.  4. Acute right peroneal DVT. Patient on full anticoagulation with enoxaparin.   5. AKI on CKD stage 3with hypokalemia and hypomagnesemia. Renal function with serum cr at 0,92 with K at 3,7 and serum bicarbonate at 28. Mg on yesterday was 1,6. Will continue K correction with Kcl and follow up electrolytes in am. Trial of diuresis for volume overload.    6. Uncontrolled T2DM (Hgb A1c 9.0) . Fasting glucose this am 115, capillary 120, 109, 160. Will decrease basal insulin to prevent hypoglycemia, patient with poor enteral intake.   7. HTN and Hx of heart transplant. LV systolic function 65 to [id number]. No signs of heart failure, will continue immunosuppressive therapy. Will stop hydralazine.   8. Protein calorie malnutrition. Patient with very poor prognosis, will continue tube feedings as tolerated,   DVT prophylaxis: enoxaparin   Code Status:  dnr  Family Communication:  Disposition Plan/ discharge barriers: patient critically ill.  Patient critically ill, will continue supportive medical therapy to prevent imminent deterioration.  Critical care time 60 minutes.    Nutrition Status: Nutrition Problem: Severe Malnutrition Etiology: acute illness(COVID-19) Signs/Symptoms: energy intake < or equal to 50% for > or equal to 5 days, percent weight loss(10% weight loss within one month) Percent weight loss: 10 % Interventions: Tube feeding    Subjective: Patient is not responsive to pain or verbal stimuli.  All information from nursing at the beside. Patient has been agitated and pulling oxygen devices. Has NG tube in place.   Objective: Vitals:   06/12/19 0300 06/12/19 0400 06/12/19 0500 06/12/19 0700  BP: (!) 141/91 (!) 152/92 137/80 (!) 158/102  Pulse: 74 68 67 67  Resp: (!) 25 (!) 28 16 15   Temp:      TempSrc:      SpO2: (!) 78% 98% 97% 100%  Weight:      Height:        Intake/Output Summary (Last 24 hours) at 06/12/2019 0813 Last data filed at 06/12/2019 0500 Gross per 24 hour  Intake 506.79 ml  Output 2000 ml  Net -1493.21 ml   Filed Weights   06/09/19 0442 06/10/19 0459 06/11/19 0500  Weight: 86.2 kg 90.2 kg 84.6 kg    Examination:   General: deconditioned and ill looking appearing  Neurology: unresponsive, sedated.  E ENT: positive pallor, no icterus, oral mucosa moist Cardiovascular: No JVD. S1-S2 present, rhythmic, no gallops, rubs, or murmurs. No lower extremity edema. Pulmonary: decreased breath sounds bilaterally, poor air movement. Gastrointestinal. Abdomen with no organomegaly, non tender, no rebound or guarding Skin. No rashes Musculoskeletal: no joint deformities     Data Reviewed: I have personally reviewed following labs and imaging studies  CBC: Recent Labs  Lab 06/07/19 0137 06/09/19 0440 06/10/19 0620 06/11/19 0415 06/12/19 0600  WBC 5.7 4.7 6.2 7.3 8.2  HGB 12.8* 13.4 13.1 13.6 14.0  HCT 38.5* 41.5 41.0 42.5 43.9  MCV 93.7 93.5  93.0 93.8 95.0  PLT 180 150 184 144* [id number]*   Basic Metabolic Panel: Recent Labs  Lab 06/08/19 0429 06/09/19 0440 06/10/19 0620 06/10/19 2050 06/11/19 0415 06/12/19 0600  NA 146* 148* 146*  --  147* 147*  K 3.4* 3.2* 4.2  --  4.2 3.7  CL 107 109 110  --  108 108  CO2 23 28 26   --  26 28  GLUCOSE 197* 177* 86  --  188* 115*  BUN 62* 42* 39*  --  45* 37*  CREATININE 1.30* 0.80 1.03  --  1.09 0.92  CALCIUM 7.3* 6.9* 6.7*  --  6.6* 6.5*  MG  --  1.4* 2.2 1.8 1.6*  --   PHOS  --   --  3.4 4.7* 4.1  --     GFR: Estimated Creatinine Clearance: 67.2 mL/min (by C-G formula based on SCr of 0.92 mg/dL). Liver Function Tests: Recent Labs  Lab 06/09/19 0440 06/10/19 0620 06/11/19 0415 06/12/19 0600  AST 20 26 27 28   ALT 23 28 33 34  ALKPHOS 85 93 122 131*  BILITOT 0.6 0.6 0.7 0.7  PROT 5.6* 5.5* 5.7* 6.1*  ALBUMIN 2.8* 2.4* 2.4* 2.3*   No results for input(s): LIPASE, AMYLASE in the last 168 hours. No results for input(s): AMMONIA in the last 168 hours. Coagulation Profile: No results for input(s): INR, PROTIME in the last 168 hours. Cardiac Enzymes: No results for input(s): CKTOTAL, CKMB, CKMBINDEX, TROPONINI in the last 168 hours. BNP (last 3 results) No results for input(s): PROBNP in the last 8760 hours. HbA1C: No results for input(s): HGBA1C in the last 72 hours. CBG: Recent Labs  Lab 06/12/19 0122 06/12/19 0201 06/12/19 0241 06/12/19 0514 06/12/19 0740  GLUCAP 81 78 138* 120* 109*   Lipid Profile: No results for input(s): CHOL, HDL, LDLCALC, TRIG, CHOLHDL, LDLDIRECT in the last 72 hours. Thyroid Function Tests: No results for input(s): TSH, T4TOTAL, FREET4, T3FREE, THYROIDAB in the last 72 hours. Anemia Panel: Recent Labs    06/11/19 0415 06/12/19 0600  FERRITIN 875* 845*      Radiology Studies: I have reviewed all of the imaging during this hospital visit personally     Scheduled Meds: . allopurinol  300 mg Oral Daily  . vitamin C  500 mg Oral Daily  . chlorhexidine  15 mL Mouth Rinse BID  . Chlorhexidine Gluconate Cloth  6 each Topical Daily  . clopidogrel  75 mg Oral QHS  . enoxaparin (LOVENOX) injection  1 mg/kg Subcutaneous Q12H  . feeding supplement (PRO-STAT SUGAR FREE 64)  30 mL Per Tube BID  . feeding supplement (VITAL HIGH PROTEIN)  1,000 mL Per Tube Q24H  . folic acid  1 mg Intravenous Daily   Or  . folic acid  1 mg Oral Daily  . insulin aspart  0-15 Units Subcutaneous Q4H  . insulin detemir  35 Units Subcutaneous Daily  . latanoprost   1 drop Both Eyes QHS  . mouth rinse  15 mL Mouth Rinse q12n4p  . methylPREDNISolone (SOLU-MEDROL) injection  60 mg Intravenous Q12H  . multivitamin with minerals  1 tablet Oral Daily  . PHENObarbital  130 mg Intravenous Daily  . potassium chloride  40 mEq Per Tube Once  . pravastatin  20 mg Oral QHS  . propranolol  10 mg Oral BID AC  . QUEtiapine  50 mg Oral BID  . sodium chloride flush  10-40 mL Intracatheter Q12H  . thiamine  100 mg Oral Daily  Or  . thiamine  100 mg Intravenous Daily  . zinc sulfate  220 mg Oral Daily   Continuous Infusions: . dexmedetomidine (PRECEDEX) IV infusion 1.8 mcg/kg/hr (06/12/19 0646)  . dextrose 50 mL/hr (06/12/19 0118)  . tacrolimus (PROGRAF) IV 0.008 mg/kg/day (06/12/19 0556)     LOS: 8 days        Avanti Jetter Gerome Apley, MD

## 2019-06-12 NOTE — Progress Notes (Signed)
updated wife via telephone; all questions answered

## 2019-06-14 LAB — CULTURE, BLOOD (ROUTINE X 2)
Culture: NO GROWTH
Culture: NO GROWTH
Special Requests: ADEQUATE

## 2019-07-01 NOTE — Progress Notes (Signed)
Spoke toTracy at [employer] in regards to pacemaker spikes and turning off pacemaker. She stated "pacemakers are no longer turned off".

## 2019-07-01 NOTE — Death Summary Note (Signed)
Death Summary  Brandon [name] [id number] DOB: 23-Jul-1940 DOA: 2019/06/12  PCP: Leanna Battles, MD  Admit date: 2019/06/12 Date of Death: 06-22-2019 Time of Death: 07-29-22 Notification: Leanna Battles, MD notified of death of 2019/06/24   History of present illness:  Brandon [name] is a 79 y.o. male with a history of  past medical history for sp cardiac transplant, type 2 diabetes mellitus, hypertension, chronic kidney disease stage III, history of CVA and status post pacemaker  Brandon [name] presented with complaint of  hypoglycemia, confusion and weakness.   Brandon [name] did not improve after aggressive medical therapy.  Patient was admitted to the hospital with a working diagnosis of acute hypoxic respiratory failure due to SARS COVID-19 viral pneumonia.  Prolonged hospitalization complicated by Streptococcus bacteremia with sepsis (present on admission), end-organ damage metabolic encephalopathy and alcohol withdrawal syndrome.   79 year old male who presented with hypoglycemia, confusion and weakness.  Patient does have a significant past medical history for sp cardiac transplant, type 2 diabetes mellitus, hypertension, chronic kidney disease stage III, history of CVA and status post pacemaker.  Recent hospitalization (01/28 to 01/30) for a fall related COVID-19 and discharged home.  For the last 2 days prior to current hospitalization patient was noted to be increasingly confused positive diarrhea and hypoglycemia.  EMS was called and patient was brought to the hospital.  On his initial physical examination his blood pressure was 95/73, pulse rate 87, respiratory rate 17, temperature 97.9, oxygen saturation 90% on supplemental oxygen.  His lungs had no rhonchi or rales, heart S1-S2, present, abdomen was soft, no lower extremity edema, patient was oriented x2, moving all 4 extremities. Sodium 134, potassium 4.2, chloride 98, bicarb 22, glucose 88, BUN 44, creatinine 1.94.  White  count 6.7, hemoglobin 17.1, hematocrit 52.2, platelets 162.  Urinalysis specific gravity 1.018, 100 protein.  His chest radiograph had left upper lobe atelectasis, loss of lung volume left lower lobe.  EKG 116 bpm, left axis deviation, positive PACs, sinus rhythm, no ST segment changes, negative T waves V1 to V3, positive baseline artifact.   Has been treated with supplemental oxygen per high flow nasal cannula, systemic corticosteroids, remdesivir and antibiotic therapy. He was placed on alcohol withdrawal protocol.  02/04 He required dexmedetomidine infusion in the intensive care unit for severe withdrawal symptoms.  Patient with persistent high oxygen requirements per high flow nasal cannula and non rebreather mask.    Final Diagnoses:  1. Acute hypoxic respiratory failure due to SARS COVID 19 viral pneumonia.  2. Acute metabolic encephalopathy.  3. Streptococcus bacteremia/ complicated with sepsis, end organ failure encephalopathy.  4. Acute right peroneal DVT. 5. AKI on CKD stage 3with hypokalemia and hypomagnesemia.  6. Uncontrolled T2DM (Hgb A1c 9.0) .  7. HTN and Hx of heart transplant. 8. Protein calorie malnutrition   The results of significant diagnostics from this hospitalization (including imaging, microbiology, ancillary and laboratory) are listed below for reference.    Significant Diagnostic Studies: DG Chest 1 View  Result Date: 06/12/2019 CLINICAL DATA:  Dyspnea COVID-19 EXAM: CHEST  1 VIEW COMPARISON:  Chest x-ray 06/04/2019 FINDINGS: Heart size is enlarged. Signs of aortic atherosclerosis similar to prior study. Signs of median sternotomy and right-sided dual lead pacer device also similar. Increasing interstitial and airspace opacities throughout the chest with predominance of interstitial pattern with the exception of the left lung base where there is increasing opacity in this area. The left hemidiaphragm remains elevated. Visualized skeletal structures are  unremarkable. IMPRESSION:  Increasing interstitial and airspace opacities likely reflect worsening stigmata of COVID-19 infection. Difficult to exclude superimposed pulmonary edema. Electronically Signed   By: Zetta Bills M.D.   On: 06/12/2019 09:53   DG Chest 1 View  Result Date: 05/30/2019 CLINICAL DATA:  Fall EXAM: CHEST  1 VIEW COMPARISON:  03/18/2012 FINDINGS: Stable chronic elevation of the left hemidiaphragm. Right pacer in place with leads in the right atrium and right ventricle. Postoperative changes on the left. Left basilar scarring. Calcified AP window and hilar lymph nodes compatible with old granulomatous disease. No acute bony abnormality. IMPRESSION: Chronic changes.  No acute cardiopulmonary disease. Electronically Signed   By: Rolm Baptise M.D.   On: 05/30/2019 23:20   DG Thoracic Spine 2 View  Result Date: 05/30/2019 CLINICAL DATA:  Pain after fall EXAM: THORACIC SPINE 2 VIEWS COMPARISON:  None. FINDINGS: Degenerative changes. No fracture or focal bone lesion. Aortic atherosclerosis. Prior median sternotomy. IMPRESSION: No acute bony abnormality. Electronically Signed   By: Rolm Baptise M.D.   On: 05/30/2019 23:14   DG Lumbar Spine Complete  Result Date: 05/30/2019 CLINICAL DATA:  Pain after fall EXAM: LUMBAR SPINE - COMPLETE 4+ VIEW COMPARISON:  None FINDINGS: Normal alignment. No fracture. Early degenerative spurring. SI joints symmetric and unremarkable. Diffuse aortic atherosclerosis. No visible aneurysm. IMPRESSION: No acute bony abnormality. Electronically Signed   By: Rolm Baptise M.D.   On: 05/30/2019 23:15   CT Head Wo Contrast  Result Date: 06/04/2019 CLINICAL DATA:  Encephalopathy. COVID-19. EXAM: CT HEAD WITHOUT CONTRAST TECHNIQUE: Contiguous axial images were obtained from the base of the skull through the vertex without intravenous contrast. COMPARISON:  05/30/2019 FINDINGS: Brain: There is no mass, hemorrhage or extra-axial collection. There is generalized atrophy  without lobar predilection. Hypodensity of the white matter is most commonly associated with chronic microvascular disease. Old right frontal lobe infarct, unchanged. Vascular: No abnormal hyperdensity of the major intracranial arteries or dural venous sinuses. No intracranial atherosclerosis. Skull: The visualized skull base, calvarium and extracranial soft tissues are normal. Sinuses/Orbits: No fluid levels or advanced mucosal thickening of the visualized paranasal sinuses. No mastoid or middle ear effusion. The orbits are normal. 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 Electronically Signed   By: Ulyses Jarred M.D.   On: 06/04/2019 00:18   CT Head Wo Contrast  Result Date: 05/31/2019 CLINICAL DATA:  Fall EXAM: CT HEAD WITHOUT CONTRAST TECHNIQUE: Contiguous axial images were obtained from the base of the skull through the vertex without intravenous contrast. COMPARISON:  03/19/2012 FINDINGS: Brain: Old right posterior frontal infarct, stable. There is atrophy and chronic small vessel disease changes. No acute intracranial abnormality. Specifically, no hemorrhage, hydrocephalus, mass lesion, acute infarction, or significant intracranial injury. Vascular: No hyperdense vessel or unexpected calcification. Skull: No acute calvarial abnormality. Sinuses/Orbits: Visualized paranasal sinuses and mastoids clear. Orbital soft tissues unremarkable. Other: None IMPRESSION: Old right frontal infarct. Atrophy, chronic microvascular disease. No acute intracranial abnormality. Electronically Signed   By: Rolm Baptise M.D.   On: 05/31/2019 00:02   CT Cervical Spine Wo Contrast  Result Date: 05/31/2019 CLINICAL DATA:  Fall EXAM: CT CERVICAL SPINE WITHOUT CONTRAST TECHNIQUE: Multidetector CT imaging of the cervical spine was performed without intravenous contrast. Multiplanar CT image reconstructions were also generated. COMPARISON:  None.  FINDINGS: Alignment: No subluxation Skull base and vertebrae: No acute fracture. No primary bone lesion or focal pathologic process. Soft tissues and spinal canal: No prevertebral fluid or swelling. No  visible canal hematoma. Disc levels:  Diffuse degenerative disc and facet disease. Upper chest: No acute findings Other: Carotid artery calcifications. IMPRESSION: No acute bony abnormality. Electronically Signed   By: Rolm Baptise M.D.   On: 05/31/2019 00:03   DG Chest Port 1 View  Result Date: 06/04/2019 CLINICAL DATA:  Dyspnea EXAM: PORTABLE CHEST 1 VIEW COMPARISON:  06/27/2019 FINDINGS: Single frontal view of the chest demonstrates stable dual lead pacemaker. Postsurgical changes from median sternotomy. Cardiac silhouette is unchanged. Since 05/30/2019, there has been progression of basilar predominant interstitial and ground-glass opacities, most pronounced at the right lung base. This is superimposed upon chronic scarring at the left lung base. There is chronic elevation of the left hemidiaphragm. No effusion or pneumothorax. IMPRESSION: 1. Progressive bibasilar interstitial and ground-glass opacities as above. Differential includes interstitial edema versus atypical multifocal pneumonia. Electronically Signed   By: Randa Ngo M.D.   On: 06/04/2019 13:13   DG Chest Port 1 View  Result Date: 06/12/2019 CLINICAL DATA:  Hypoglycemia EXAM: PORTABLE CHEST 1 VIEW COMPARISON:  05/30/2019 FINDINGS: Cardiac shadow is enlarged but stable. Pacing device is again seen as well as postsurgical changes. Scarring is noted in the left lung. No focal infiltrate or sizable effusion is seen. Changes consistent with prior granulomatous disease are noted. No acute abnormality is seen. IMPRESSION: No acute abnormality noted.  Chronic changes as described. Electronically Signed   By: Inez Catalina M.D.   On: 06/06/2019 23:23   ECHOCARDIOGRAM COMPLETE  Result Date: 06/05/2019   ECHOCARDIOGRAM REPORT   Patient Name:   JAKEOB ATHERTON Date of Exam: 06/05/2019 Medical Rec #:  [id number]       Height:       69.0 in Accession #:    [id number]      Weight:       188.1 lb Date of Birth:  November 06, 1940      BSA:          2.01 m Patient Age:    65 years        BP:           130/54 mmHg Patient Gender: M               HR:           98 bpm. Exam Location:  Inpatient Procedure: 2D Echo, Cardiac Doppler and Color Doppler Indications:    Bacteremia  History:        Patient has no prior history of Echocardiogram examinations.                 Pacemaker, Stroke; Risk Factors:Hypertension and Diabetes. Heart                 Transplant.  Sonographer:    Jannett Celestine RDCS (AE) Referring Phys: [id number] ERIC J [place]  Sonographer Comments: Covid positive. IMPRESSIONS  1. Left ventricular ejection fraction, by visual estimation, is 65 to 70%. The left ventricle has hyperdynamic function. There is no left ventricular hypertrophy.  2. Left ventricular diastolic parameters are consistent with Grade I diastolic dysfunction (impaired relaxation).  3. The left ventricle has no regional wall motion abnormalities.  4. Global right ventricle has normal systolic function.The right ventricular size is normal. No increase in right ventricular wall thickness.  5. Left atrial size was mildly dilated.  6. Right atrial size was normal.  7. The mitral valve is normal in structure. No evidence of mitral valve regurgitation.  8. The tricuspid valve is grossly  normal.  9. The tricuspid valve is grossly normal. Tricuspid valve regurgitation is not demonstrated. 10. The aortic valve is grossly normal. Aortic valve regurgitation is not visualized. 11. The pulmonic valve was grossly normal. Pulmonic valve regurgitation is not visualized. 12. The inferior vena cava is normal in size with greater than 50% respiratory variability, suggesting right atrial pressure of 3 mmHg. 13. The interatrial septum was not wll visualized. 14. Interpretation limited due to poor sound wave transmission.  LVEF is normal. LV appears underfilled. Valves not seen well but no obvious vegetations noted.limited due to poor sound wave trans,mis FINDINGS  Left Ventricle: Left ventricular ejection fraction, by visual estimation, is 65 to 70%. The left ventricle has hyperdynamic function. The left ventricle has no regional wall motion abnormalities. There is no left ventricular hypertrophy. Left ventricular diastolic parameters are consistent with Grade I diastolic dysfunction (impaired relaxation). Right Ventricle: The right ventricular size is normal. No increase in right ventricular wall thickness. Global RV systolic function is has normal systolic function. Left Atrium: Left atrial size was mildly dilated. Right Atrium: Right atrial size was normal in size Pericardium: There is no evidence of pericardial effusion. Mitral Valve: The mitral valve is normal in structure. No evidence of mitral valve regurgitation. Tricuspid Valve: The tricuspid valve is grossly normal. Tricuspid valve regurgitation is not demonstrated. Aortic Valve: The aortic valve is grossly normal. Aortic valve regurgitation is not visualized. Pulmonic Valve: The pulmonic valve was grossly normal. Pulmonic valve regurgitation is not visualized. Pulmonic regurgitation is not visualized. Aorta: The aortic root and ascending aorta are structurally normal, with no evidence of dilitation. Venous: The inferior vena cava is normal in size with greater than 50% respiratory variability, suggesting right atrial pressure of 3 mmHg. IAS/Shunts: The interatrial septum was not well visualized.  LEFT VENTRICLE PLAX 2D LVIDd:         2.88 cm  Diastology LVIDs:         1.99 cm  LV e' lateral:   10.10 cm/s LV PW:         1.06 cm  LV E/e' lateral: 7.9 LV IVS:        1.12 cm LVOT diam:     1.80 cm LV SV:         19 ml LV SV Index:   9.29 LVOT Area:     2.54 cm  LEFT ATRIUM             Index LA diam:        4.20 cm 2.09 cm/m LA Vol (A2C):   40.2 ml 19.97 ml/m LA Vol (A4C):    52.1 ml 25.89 ml/m LA Biplane Vol: 47.6 ml 23.65 ml/m  AORTIC VALVE LVOT Vmax:   57.60 cm/s LVOT Vmean:  45.500 cm/s LVOT VTI:    0.142 m  AORTA Ao Root diam: 2.90 cm MITRAL VALVE MV Area (PHT): 4.15 cm             SHUNTS MV PHT:        53.07 msec           Systemic VTI:  0.14 m MV Decel Time: 183 msec             Systemic Diam: 1.80 cm MV E velocity: 79.50 cm/s 103 cm/s MV A velocity: 47.50 cm/s 70.3 cm/s MV E/A ratio:  1.67       1.5  Glori Bickers MD Electronically signed by Glori Bickers MD Signature Date/Time: 06/05/2019/5:45:09 PM    Final  VAS [place] LOWER EXTREMITY VENOUS (DVT)  Result Date: 06/10/2019  Lower Venous DVT Study Indications: Covid-19, elevated D-Dimer.  Limitations: Ventilation. Comparison Study: No prior study on file for comparison Performing Technologist: Sharion Dove RVS  Examination Guidelines: A complete evaluation includes B-mode imaging, spectral Doppler, color Doppler, and power Doppler as needed of all accessible portions of each vessel. Bilateral testing is considered an integral part of a complete examination. Limited examinations for reoccurring indications may be performed as noted. The reflux portion of the exam is performed with the patient in reverse Trendelenburg.  +---------+---------------+---------+-----------+----------+--------------+ RIGHT    CompressibilityPhasicitySpontaneityPropertiesThrombus Aging +---------+---------------+---------+-----------+----------+--------------+ CFV      Full           Yes      Yes                                 +---------+---------------+---------+-----------+----------+--------------+ SFJ      Full                                                        +---------+---------------+---------+-----------+----------+--------------+ FV Prox  Full                                                        +---------+---------------+---------+-----------+----------+--------------+ FV Mid   Full                                                         +---------+---------------+---------+-----------+----------+--------------+ FV DistalFull                                                        +---------+---------------+---------+-----------+----------+--------------+ PFV      Full                                                        +---------+---------------+---------+-----------+----------+--------------+ POP      Full           Yes      Yes                                 +---------+---------------+---------+-----------+----------+--------------+ PTV      Full                                                        +---------+---------------+---------+-----------+----------+--------------+ PERO     None  Acute          +---------+---------------+---------+-----------+----------+--------------+   +---------+---------------+---------+-----------+----------+--------------+ LEFT     CompressibilityPhasicitySpontaneityPropertiesThrombus Aging +---------+---------------+---------+-----------+----------+--------------+ CFV      Full           Yes      Yes                                 +---------+---------------+---------+-----------+----------+--------------+ SFJ      Full                                                        +---------+---------------+---------+-----------+----------+--------------+ FV Prox  Full                                                        +---------+---------------+---------+-----------+----------+--------------+ FV Mid   Full                                                        +---------+---------------+---------+-----------+----------+--------------+ FV DistalFull                                                        +---------+---------------+---------+-----------+----------+--------------+ PFV      Full                                                         +---------+---------------+---------+-----------+----------+--------------+ POP      Full           Yes      Yes                                 +---------+---------------+---------+-----------+----------+--------------+ PTV      Full                                                        +---------+---------------+---------+-----------+----------+--------------+ PERO     Full                                                        +---------+---------------+---------+-----------+----------+--------------+     Summary: RIGHT: - Findings consistent with acute deep vein thrombosis involving the right peroneal veins.  LEFT: - There is no evidence of deep vein thrombosis in the lower  extremity.  *See table(s) above for measurements and observations. Electronically signed by Monica Martinez MD on 06/10/2019 at 9:21:38 AM.    Final     Microbiology: Recent Results (from the past 240 hour(s))  Culture, blood (routine x 2)     Status: None   Collection Time: 06/05/19  3:00 PM   Specimen: BLOOD  Result Value Ref Range Status   Specimen Description   Final    BLOOD LEFT HAND Performed at [facility], [place] [address] [place], [place] [address]    Special Requests   Final    BOTTLES DRAWN AEROBIC ONLY Blood Culture results may not be optimal due to an inadequate volume of blood received in culture bottles Performed at [facility] [address] [place], [place] [address]    Culture   Final    NO GROWTH 9 DAYS Performed at [facility], [place] [address] [place], [place] [address]    Report Status 06/14/2019 FINAL  Final  Culture, blood (routine x 2)     Status: None   Collection Time: 06/05/19  3:06 PM   Specimen: BLOOD  Result Value Ref Range Status   Specimen Description   Final    BLOOD RIGHT HAND Performed at [facility] [address], [place], [place] [address]    Special Requests   Final    BOTTLES DRAWN  AEROBIC ONLY Blood Culture adequate volume Performed at [facility] [address], [place], [place] [address]    Culture   Final    NO GROWTH 9 DAYS Performed at [facility], [place] [address] [place], [place] [address]    Report Status 06/14/2019 FINAL  Final  MRSA PCR Screening     Status: None   Collection Time: 06/07/19  6:48 PM   Specimen: Nasal Mucosa; Nasopharyngeal  Result Value Ref Range Status   MRSA by PCR NEGATIVE NEGATIVE Final    Comment:        The GeneXpert MRSA Assay (FDA approved for NASAL specimens only), is one component of a comprehensive MRSA colonization surveillance program. It is not intended to diagnose MRSA infection nor to guide or monitor treatment for MRSA infections. Performed at [facility], [place] [address] [place], [place] [address]      Labs: Basic Metabolic Panel: Recent Labs  Lab 06/09/19 0440 06/09/19 0440 06/10/19 0620 06/10/19 0620 06/10/19 2050 06/11/19 0415 06/12/19 0600  NA 148*  --  146*  --   --  147* 147*  K 3.2*   < > 4.2   < >  --  4.2 3.7  CL 109  --  110  --   --  108 108  CO2 28  --  26  --   --  26 28  GLUCOSE 177*  --  86  --   --  188* 115*  BUN 42*  --  39*  --   --  45* 37*  CREATININE 0.80  --  1.03  --   --  1.09 0.92  CALCIUM 6.9*  --  6.7*  --   --  6.6* 6.5*  MG 1.4*  --  2.2  --  1.8 1.6*  --   PHOS  --   --  3.4  --  4.7* 4.1  --    < > = values in this interval not displayed.   Liver Function Tests: Recent Labs  Lab 06/09/19  [id number] 06/10/19 0620 06/11/19 0415 06/12/19 0600  AST 20 26 27 28   ALT 23 28 33 34  ALKPHOS 85 93 122 131*  BILITOT 0.6 0.6 0.7 0.7  PROT 5.6* 5.5* 5.7* 6.1*  ALBUMIN 2.8* 2.4* 2.4* 2.3*   No results for input(s): LIPASE, AMYLASE in the last 168 hours. No results for input(s): AMMONIA in the last 168 hours. CBC: Recent Labs  Lab 06/09/19 0440 06/10/19 0620 06/11/19 0415 06/12/19 0600  WBC 4.7 6.2 7.3 8.2  HGB 13.4  13.1 13.6 14.0  HCT 41.5 41.0 42.5 43.9  MCV 93.5 93.0 93.8 95.0  PLT 150 184 144* 129*   Cardiac Enzymes: No results for input(s): CKTOTAL, CKMB, CKMBINDEX, TROPONINI in the last 168 hours. D-Dimer No results for input(s): DDIMER in the last 72 hours. BNP: Invalid input(s): POCBNP CBG: Recent Labs  Lab 06/12/19 0514 06/12/19 0740 06/12/19 1138 06/12/19 1545 06/12/19 1939  GLUCAP 120* 109* 160* 192* 178*   Anemia work up No results for input(s): VITAMINB12, FOLATE, FERRITIN, TIBC, IRON, RETICCTPCT in the last 72 hours. Urinalysis    Component Value Date/Time   COLORURINE YELLOW 06/04/2019 0110   APPEARANCEUR HAZY (A) 06/04/2019 0110   LABSPEC 1.018 06/04/2019 0110   PHURINE 5.0 06/04/2019 0110   GLUCOSEU 150 (A) 06/04/2019 0110   HGBUR SMALL (A) 06/04/2019 0110   BILIRUBINUR NEGATIVE 06/04/2019 0110   KETONESUR NEGATIVE 06/04/2019 0110   PROTEINUR 100 (A) 06/04/2019 0110   NITRITE NEGATIVE 06/04/2019 0110   LEUKOCYTESUR NEGATIVE 06/04/2019 0110   Sepsis Labs Invalid input(s): PROCALCITONIN,  WBC,  LACTICIDVEN     SIGNED:  Tawni Millers, MD  [facility] 06/15/2019, 2:30 PM Pager   If 7PM-7AM, please contact night-coverage [email or web address] Password [email or web address]

## 2019-07-01 NOTE — Progress Notes (Signed)
Comfort care measures now initiated. Wife able to video conference with husband.

## 2019-07-01 NOTE — Progress Notes (Signed)
Morphine 75 cc wasted and verified with Jeremy Johann, RN.

## 2019-07-01 NOTE — Progress Notes (Signed)
Assisted tele visit to patient with wife.  Yoel Kaufhold, Canary Brim, RN

## 2019-07-01 NOTE — Progress Notes (Signed)
Spoke with Dr. Lucile Shutters in regard to pt being uncomfortable at this time. On 15L HFNC +NRBM, spo2 70-80%. Per Dr. Lucile Shutters will speak to family in regard to code status.

## 2019-07-01 NOTE — Progress Notes (Signed)
No visible chest rise at this time. Pacing at 60. No heart tones or pulses noted.

## 2019-07-01 NOTE — Final Progress Note (Signed)
Belongings at this time: wedding band,cell phone, glasses, slippers, clothing and cell phone charger. To be taken to morgue with pt. Security confirmed no belongings with them.

## 2019-07-01 NOTE — Progress Notes (Signed)
Time of death noted at [place].

## 2019-07-01 NOTE — Progress Notes (Signed)
Spoke to Agnew (wife) and updated on passing of her husband. Address given and number for patient placement when funeral home decided.

## 2019-07-01 DEATH — deceased

## 2020-08-02 IMAGING — CT CT HEAD W/O CM
3 series · 15 of 47 positions shown, 18 images · non-contrast
Comparison: 05/30/2019

CLINICAL DATA: Encephalopathy. [id number].

EXAM:
CT HEAD WITHOUT CONTRAST
TECHNIQUE: Contiguous axial images were obtained from the base of the skull
through the vertex without intravenous contrast.

[Series 3: head 5.0 h30s · axial · 0.46mm/px · z∈[-45,+95]mm · 9 of 34 slices shown, 12 images]
[im 3/34  brain]
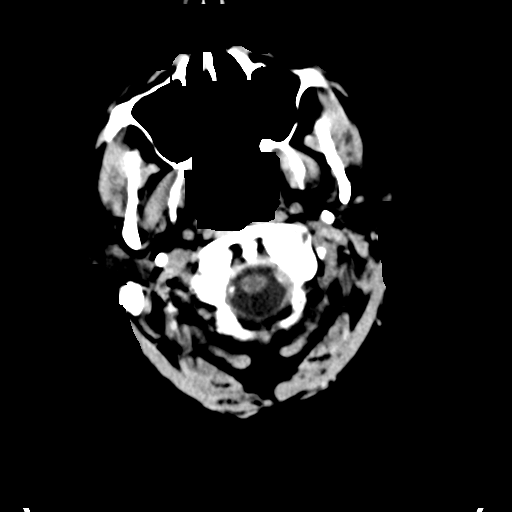
[im 3/34  bone]
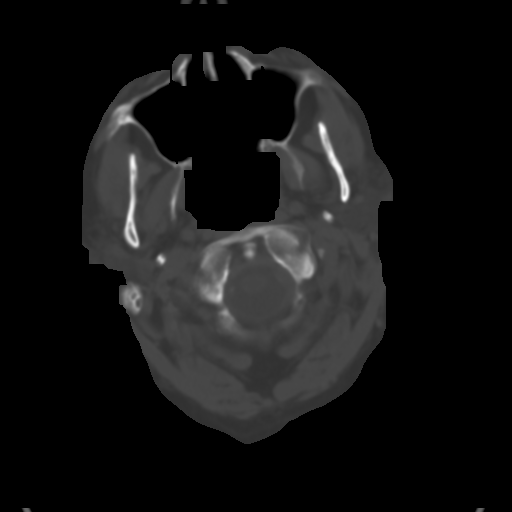
[im 6/34  brain]
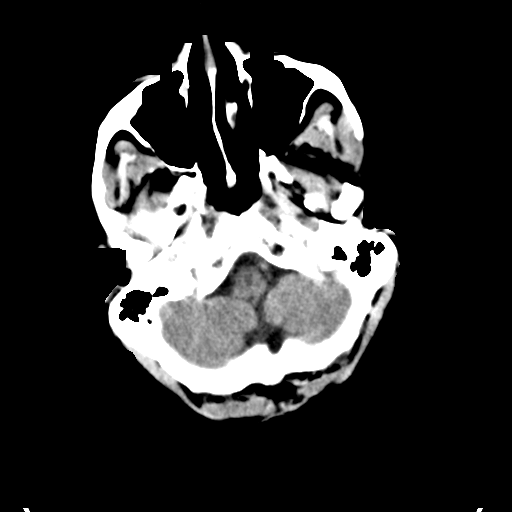
[im 10/34  brain]
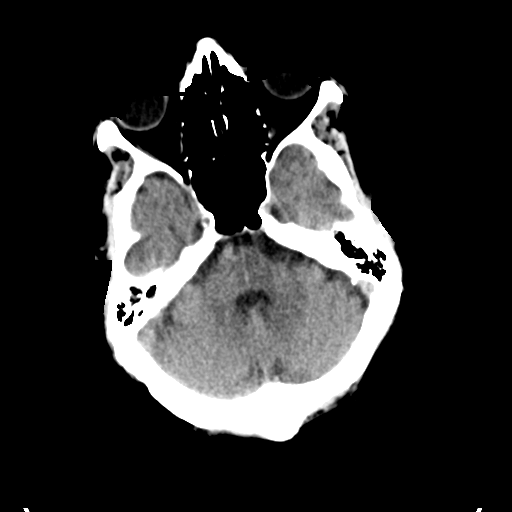
[im 13/34  brain]
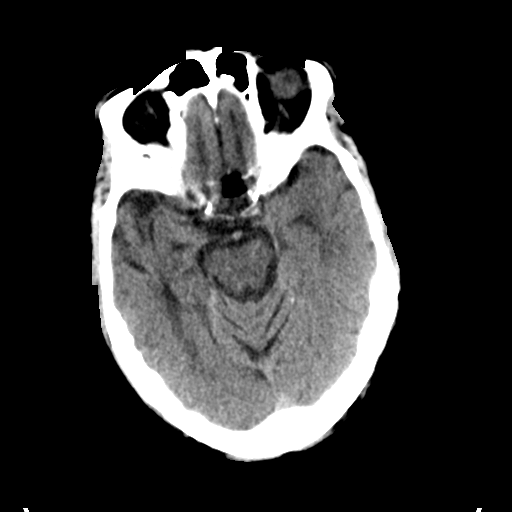
[im 18/34  brain]
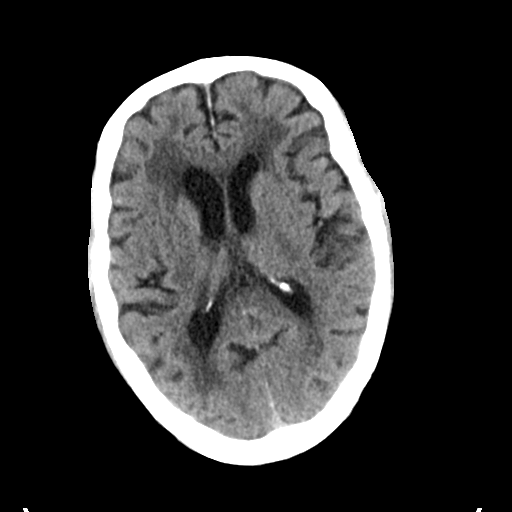
[im 18/34  bone]
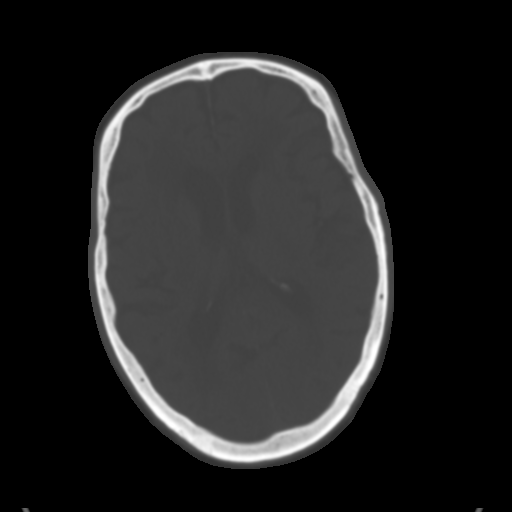
[im 21/34  brain]
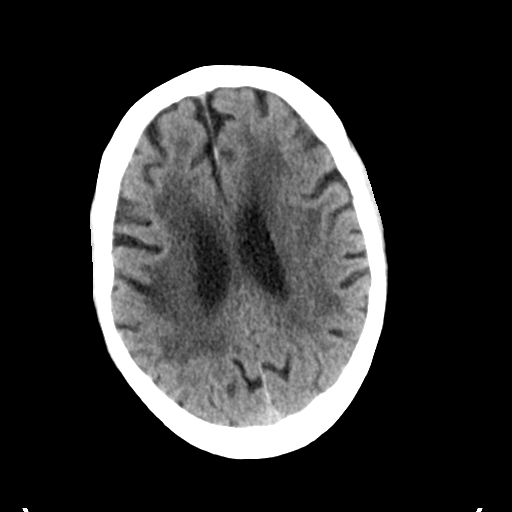
[im 24/34  brain]
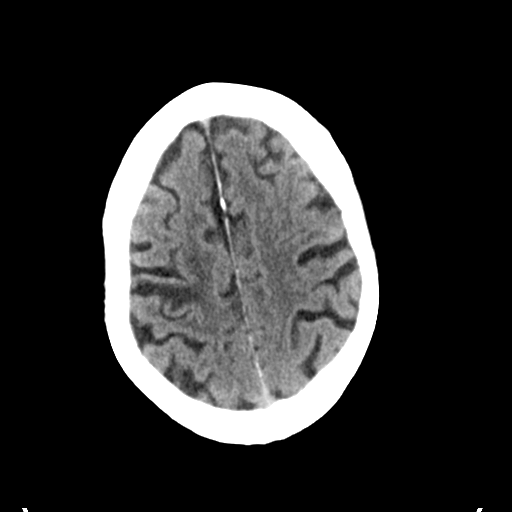
[im 28/34  brain]
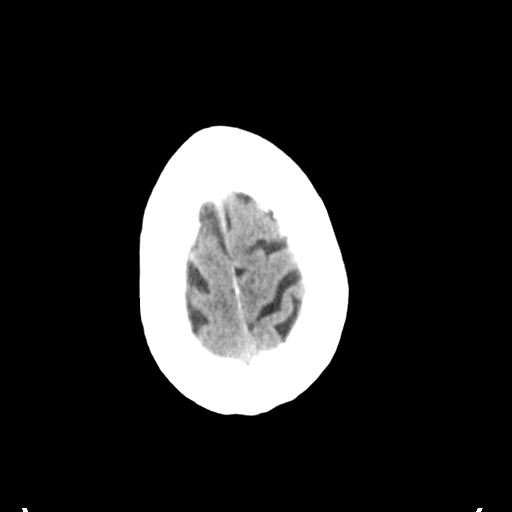
[im 31/34  brain]
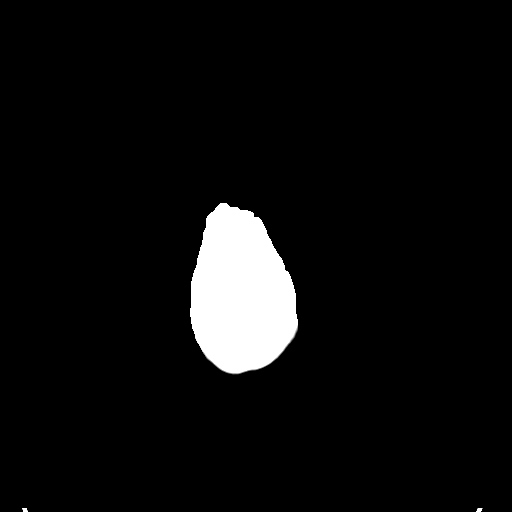
[im 31/34  bone]
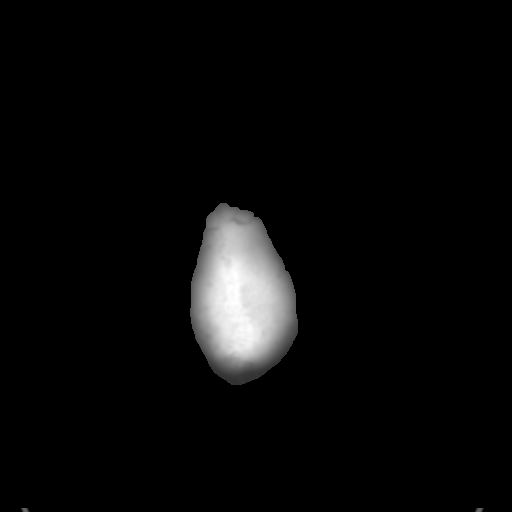

[Series 5: head 3.0 mpr cor · coronal · 0.32mm/px · 3 of 72 slices shown]
[im 24/72  brain]
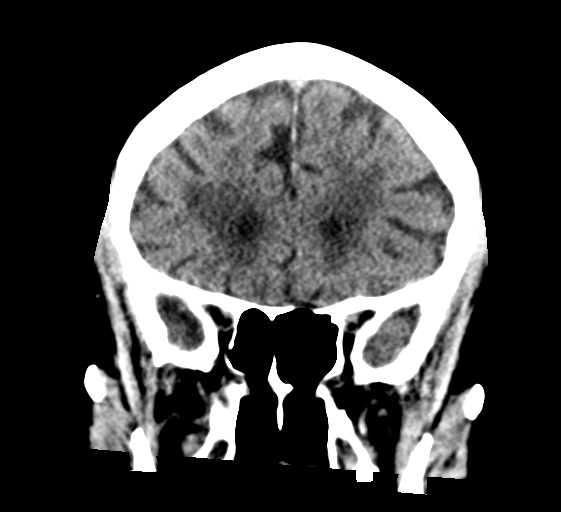
[im 32/72  brain]
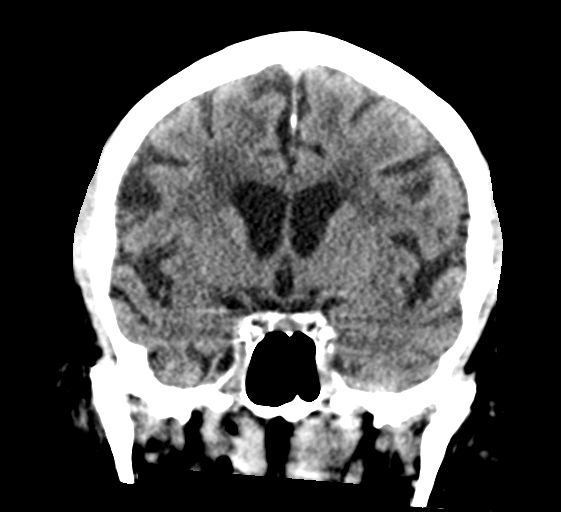
[im 40/72  brain]
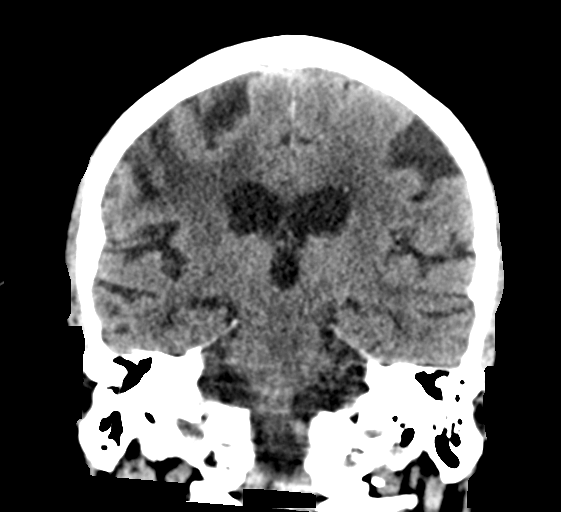

[Series 6: head 3.0 mpr sag · sagittal · 0.32mm/px · 3 of 56 slices shown]
[im 19/56  brain]
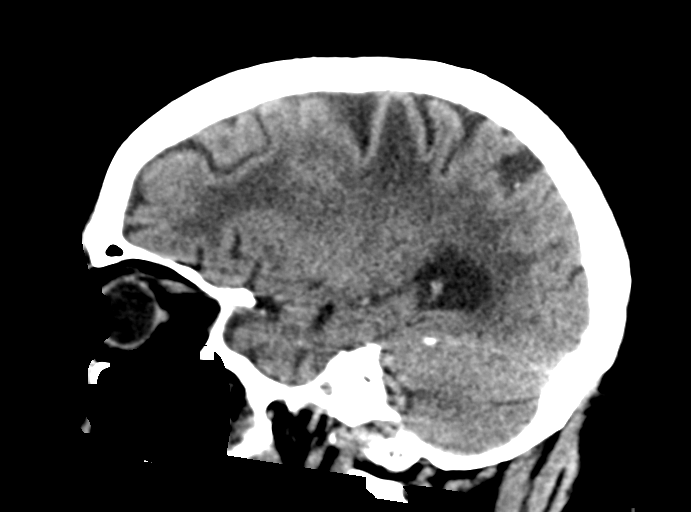
[im 28/56  brain]
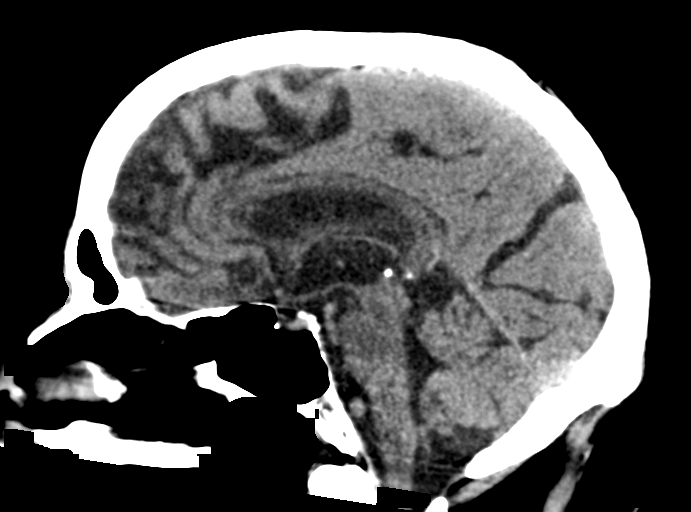
[im 37/56  brain]
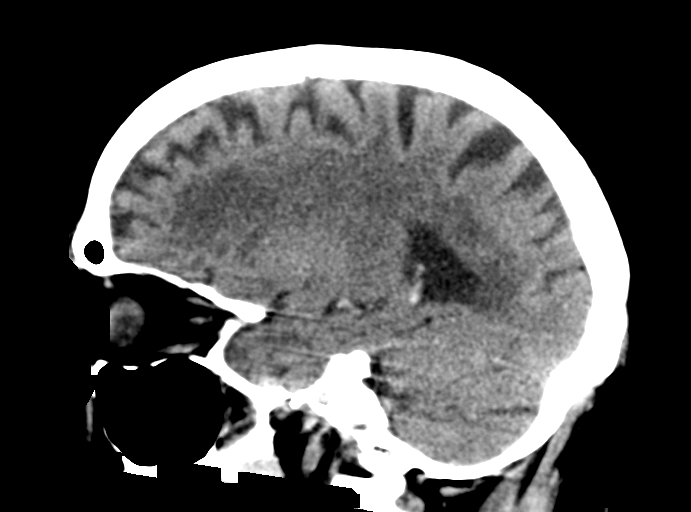

[15 of 47 positions shown; findings below may reference images not displayed]

FINDINGS: Brain: There is no mass, hemorrhage or extra-axial collection. There
is generalized atrophy without lobar predilection. Hypodensity of
the white matter is most commonly associated with chronic
microvascular disease. Old right frontal lobe infarct, unchanged.

Vascular: No abnormal hyperdensity of the major intracranial
arteries or dural venous sinuses. No intracranial atherosclerosis.

Skull: The visualized skull base, calvarium and extracranial soft
tissues are normal.

Sinuses/Orbits: No fluid levels or advanced mucosal thickening of
the visualized paranasal sinuses. No mastoid or middle ear effusion.
The orbits are normal.
IMPRESSION: Chronic ischemic microangiopathy and old right frontal lobe infarct
without acute intracranial abnormality.
